# Patient Record
Sex: Female | Born: 1965 | Race: White | Hispanic: No | Smoking: Never smoker
Health system: Southern US, Community
[De-identification: ages and names within clinical notes are randomized; demographics above are authoritative.]

## PROBLEM LIST (undated history)

## (undated) DIAGNOSIS — F4541 Pain disorder exclusively related to psychological factors: Secondary | ICD-10-CM

## (undated) DIAGNOSIS — A6000 Herpesviral infection of urogenital system, unspecified: Secondary | ICD-10-CM

## (undated) DIAGNOSIS — K219 Gastro-esophageal reflux disease without esophagitis: Secondary | ICD-10-CM

## (undated) DIAGNOSIS — F329 Major depressive disorder, single episode, unspecified: Secondary | ICD-10-CM

## (undated) DIAGNOSIS — R51 Headache: Secondary | ICD-10-CM

## (undated) DIAGNOSIS — M1990 Unspecified osteoarthritis, unspecified site: Secondary | ICD-10-CM

## (undated) DIAGNOSIS — F988 Other specified behavioral and emotional disorders with onset usually occurring in childhood and adolescence: Secondary | ICD-10-CM

## (undated) DIAGNOSIS — N399 Disorder of urinary system, unspecified: Secondary | ICD-10-CM

## (undated) DIAGNOSIS — T7840XA Allergy, unspecified, initial encounter: Secondary | ICD-10-CM

## (undated) DIAGNOSIS — R350 Frequency of micturition: Secondary | ICD-10-CM

## (undated) DIAGNOSIS — I10 Essential (primary) hypertension: Secondary | ICD-10-CM

## (undated) DIAGNOSIS — R519 Headache, unspecified: Secondary | ICD-10-CM

## (undated) DIAGNOSIS — I639 Cerebral infarction, unspecified: Secondary | ICD-10-CM

## (undated) DIAGNOSIS — IMO0002 Reserved for concepts with insufficient information to code with codable children: Secondary | ICD-10-CM

## (undated) DIAGNOSIS — F32A Depression, unspecified: Secondary | ICD-10-CM

## (undated) DIAGNOSIS — F419 Anxiety disorder, unspecified: Secondary | ICD-10-CM

## (undated) HISTORY — DX: Pain disorder exclusively related to psychological factors: F45.41

## (undated) HISTORY — DX: Headache: R51

## (undated) HISTORY — DX: Major depressive disorder, single episode, unspecified: F32.9

## (undated) HISTORY — DX: Allergy, unspecified, initial encounter: T78.40XA

## (undated) HISTORY — PX: COLPOSCOPY: SHX161

## (undated) HISTORY — DX: Depression, unspecified: F32.A

## (undated) HISTORY — DX: Cerebral infarction, unspecified: I63.9

## (undated) HISTORY — DX: Other specified behavioral and emotional disorders with onset usually occurring in childhood and adolescence: F98.8

## (undated) HISTORY — DX: Reserved for concepts with insufficient information to code with codable children: IMO0002

## (undated) HISTORY — DX: Anxiety disorder, unspecified: F41.9

## (undated) HISTORY — DX: Frequency of micturition: R35.0

## (undated) HISTORY — DX: Headache, unspecified: R51.9

## (undated) HISTORY — DX: Gastro-esophageal reflux disease without esophagitis: K21.9

## (undated) HISTORY — DX: Disorder of urinary system, unspecified: N39.9

## (undated) HISTORY — DX: Herpesviral infection of urogenital system, unspecified: A60.00

## (undated) HISTORY — DX: Unspecified osteoarthritis, unspecified site: M19.90

---

## 2003-08-26 HISTORY — PX: BREAST ENHANCEMENT SURGERY: SHX7

## 2004-08-02 ENCOUNTER — Inpatient Hospital Stay: Payer: Self-pay | Admitting: Internal Medicine

## 2006-11-23 ENCOUNTER — Ambulatory Visit: Payer: Self-pay | Admitting: Internal Medicine

## 2007-03-28 HISTORY — PX: ENDOMETRIAL BIOPSY: SHX622

## 2008-06-02 ENCOUNTER — Emergency Department (HOSPITAL_COMMUNITY): Admission: EM | Admit: 2008-06-02 | Discharge: 2008-06-02 | Payer: Self-pay | Admitting: Emergency Medicine

## 2008-06-24 ENCOUNTER — Ambulatory Visit: Payer: Self-pay | Admitting: Internal Medicine

## 2011-08-19 ENCOUNTER — Ambulatory Visit: Payer: Self-pay | Admitting: Internal Medicine

## 2013-02-06 HISTORY — PX: BREAST BIOPSY: SHX20

## 2013-02-11 ENCOUNTER — Ambulatory Visit: Payer: Self-pay

## 2015-07-26 DIAGNOSIS — F900 Attention-deficit hyperactivity disorder, predominantly inattentive type: Secondary | ICD-10-CM | POA: Diagnosis not present

## 2015-07-26 DIAGNOSIS — F411 Generalized anxiety disorder: Secondary | ICD-10-CM | POA: Diagnosis not present

## 2015-07-26 DIAGNOSIS — N393 Stress incontinence (female) (male): Secondary | ICD-10-CM | POA: Diagnosis not present

## 2015-07-26 DIAGNOSIS — I10 Essential (primary) hypertension: Secondary | ICD-10-CM | POA: Diagnosis not present

## 2015-07-29 ENCOUNTER — Encounter: Payer: Self-pay | Admitting: Urology

## 2015-07-29 ENCOUNTER — Ambulatory Visit (INDEPENDENT_AMBULATORY_CARE_PROVIDER_SITE_OTHER): Payer: BLUE CROSS/BLUE SHIELD | Admitting: Urology

## 2015-07-29 VITALS — BP 145/83 | HR 72 | Ht 64.0 in | Wt 160.2 lb

## 2015-07-29 DIAGNOSIS — R350 Frequency of micturition: Secondary | ICD-10-CM | POA: Diagnosis not present

## 2015-07-29 DIAGNOSIS — R3129 Other microscopic hematuria: Secondary | ICD-10-CM

## 2015-07-29 DIAGNOSIS — N8110 Cystocele, unspecified: Secondary | ICD-10-CM | POA: Diagnosis not present

## 2015-07-29 DIAGNOSIS — IMO0001 Reserved for inherently not codable concepts without codable children: Secondary | ICD-10-CM

## 2015-07-29 DIAGNOSIS — R32 Unspecified urinary incontinence: Secondary | ICD-10-CM

## 2015-07-29 DIAGNOSIS — IMO0002 Reserved for concepts with insufficient information to code with codable children: Secondary | ICD-10-CM

## 2015-07-29 LAB — URINALYSIS, COMPLETE
BILIRUBIN UA: NEGATIVE
Glucose, UA: NEGATIVE
KETONES UA: NEGATIVE
Nitrite, UA: NEGATIVE
PH UA: 5.5 (ref 5.0–7.5)
PROTEIN UA: NEGATIVE
SPEC GRAV UA: 1.025 (ref 1.005–1.030)
Urobilinogen, Ur: 0.2 mg/dL (ref 0.2–1.0)

## 2015-07-29 LAB — BLADDER SCAN AMB NON-IMAGING: Scan Result: 0

## 2015-07-29 LAB — MICROSCOPIC EXAMINATION: Epithelial Cells (non renal): >10 /hpf — AB (ref 0–10)

## 2015-07-29 NOTE — Progress Notes (Signed)
07/29/2015 8:16 PM   Jillian Edwards 03/01/1966 [id number]  Referring provider: No referring provider defined for this encounter.  Chief Complaint  Patient presents with  . Urinary Frequency    referred by [name] at [facility]     HPI: Patient is a 50 year old Caucasian female who is referred by her PCP,  [name] FNP, urinary frequency and leakage.  Patient states that over the last year she has been experiencing urinary incontinence.  She states the leakage seeps through her clothes. She has tried an over-the-counter pedis area without relief. She is currently wearing panty liners in an effort to control the incontinence. She does not want to wear depends. She loses urine when she laughs, coughs and exercises. She does have some episodes of urge incontinence.  SHe has baseline urinary symptoms of urinary frequency, urgency, nocturia, intermittency and painful intercourse.  He does not experience dysuria, gross hematuria or suprapubic pain. She has not had recent fevers, chills, nausea or vomiting. She is not incontinent of stool. She was given a trial of Myrbetriq 25 mg daily, but it was unsuccessful in controlling her incontinence.  Her UA today demonstrated 3-10 RBCs per high prior field. She does not have a history of nephrolithiasis. Her PVR today is 0 mL.   PMH: Past Medical History  Diagnosis Date  . Frequent urination   . Anxiety   . Depression   . Stroke or transient ischemic attack (TIA) diagnosed during current admission     Surgical History: No past surgical history on file.  Home Medications:    Medication List       This list is accurate as of: 07/29/15 11:59 PM.  Always use your most recent med list.               ALPRAZolam 0.5 MG tablet  Commonly known as:  XANAX  TAKE 1/2-1 TABLET BY MOUTH TWICE DAILY AS NEEDED FOR ANXIETY     amphetamine-dextroamphetamine 10 MG tablet  Commonly known as:  ADDERALL  TAKE 1 TABLET BY MOUTH  TWICE A DAY AS NEEDED FOCUS **FILL 06/22/15**     buPROPion 150 MG 24 hr tablet  Commonly known as:  WELLBUTRIN XL     busPIRone 7.5 MG tablet  Commonly known as:  BUSPAR     chlorpheniramine-HYDROcodone 10-8 MG/5ML [place]  Commonly known as:  TUSSIONEX  Reported on 07/29/2015     hydrochlorothiazide 25 MG tablet  Commonly known as:  HYDRODIURIL     MYRBETRIQ 25 MG Tb24 tablet  Generic drug:  mirabegron ER  Take 50 mg by mouth daily.        Allergies: No Known Allergies  Family History: No family history on file.  Social History:  reports that she has never smoked. She does not have any smokeless tobacco history on file. She reports that she does not drink alcohol or use illicit drugs.  ROS: UROLOGY Frequent Urination?: Yes Hard to postpone urination?: Yes Burning/pain with urination?: No Get up at night to urinate?: Yes Leakage of urine?: Yes Urine stream starts and stops?: Yes Trouble starting stream?: No Do you have to strain to urinate?: No Blood in urine?: No Urinary tract infection?: No Sexually transmitted disease?: No Injury to kidneys or bladder?: No Painful intercourse?: Yes Weak stream?: No Currently pregnant?: No Vaginal bleeding?: No Last menstrual period?: n  Gastrointestinal Nausea?: No Vomiting?: No Indigestion/heartburn?: No Diarrhea?: No Constipation?: No  Constitutional Fever: No Night sweats?: No Weight loss?: No  Fatigue?: Yes  Skin Skin rash/lesions?: No Itching?: No  Eyes Blurred vision?: No Double vision?: No  Ears/Nose/Throat Sore throat?: No Sinus problems?: No  Hematologic/Lymphatic Swollen glands?: No Easy bruising?: Yes  Cardiovascular Leg swelling?: Yes Chest pain?: No  Respiratory Cough?: No Shortness of breath?: No  Endocrine Excessive thirst?: No  Musculoskeletal Back pain?: No Joint pain?: Yes  Neurological Headaches?: Yes Dizziness?: No  Psychologic Depression?: Yes Anxiety?:  Yes  Physical Exam: BP 145/83 mmHg  Pulse 72  Ht 5\' 4"  (1.626 m)  Wt 160 lb 3.2 oz (72.666 kg)  BMI 27.48 kg/m2  LMP 06/19/2015  Constitutional: Well nourished. Alert and oriented, No acute distress. HEENT: [place] AT, moist mucus membranes. Trachea midline, no masses. Cardiovascular: No clubbing, cyanosis, or edema. Respiratory: Normal respiratory effort, no increased work of breathing. GI: Abdomen is soft, non tender, non distended, no abdominal masses. Liver and spleen not palpable.  No hernias appreciated.  Stool sample for occult testing is not indicated.   GU: No CVA tenderness.  No bladder fullness or masses.  Normal external genitalia, normal pubic hair distribution, no lesions.  Normal urethral meatus, no lesions, no prolapse, no discharge.   No urethral masses, tenderness and/or tenderness. No bladder fullness, tenderness or masses. Normal vagina mucosa, good estrogen effect, no discharge, no lesions, good pelvic support, Grade II cystocele is noted.  Rectocele is not noted.  No cervical motion tenderness.  Uterus is freely mobile and non-fixed.  No adnexal/parametria masses or tenderness noted.  Anus and perineum are without rashes or lesions.    Skin: No rashes, bruises or suspicious lesions. Lymph: No cervical or inguinal adenopathy. Neurologic: Grossly intact, no focal deficits, moving all 4 extremities. Psychiatric: Normal mood and affect.  Laboratory Data:  Urinalysis Results for orders placed or performed in visit on 07/29/15  Microscopic Examination  Result Value Ref Range   WBC, UA 0-5 0 -  5 /hpf   RBC, UA 3-10 (A) 0 -  2 /hpf   Epithelial Cells (non renal) >10 (A) 0 - 10 /hpf   Bacteria, UA Few None seen/Few   Yeast, UA Present (A) None seen  Urinalysis, Complete  Result Value Ref Range   Specific Gravity, UA 1.025 1.005 - 1.030   pH, UA 5.5 5.0 - 7.5   Color, UA Yellow Yellow   Appearance Ur Hazy (A) Clear   Leukocytes, UA Trace (A) Negative   Protein, UA  Negative Negative/Trace   Glucose, UA Negative Negative   Ketones, UA Negative Negative   RBC, UA 1+ (A) Negative   Bilirubin, UA Negative Negative   Urobilinogen, Ur 0.2 0.2 - 1.0 mg/dL   Nitrite, UA Negative Negative   Microscopic Examination See below:   hCG, serum, qualitative  Result Value Ref Range   hCG,Beta Subunit,Qual,Serum Negative Negative <6 mIU/mL  BUN+Creat  Result Value Ref Range   BUN 16 6 - 24 mg/dL   Creatinine, Ser 0.82 0.57 - 1.00 mg/dL   GFR calc non Af Amer 84 >59 mL/min/1.73   GFR calc Af Amer 97 >59 mL/min/1.73   BUN/Creatinine Ratio 20 9 - 23  BLADDER SCAN AMB NON-IMAGING  Result Value Ref Range   Scan Result 0     Pertinent Imaging: Results for RYLIEGH, SEVCIK (MRN [id number]) as of 07/29/2015 13:54  Ref. Range 07/29/2015 13:45  Scan Result Unknown 0    Assessment & Plan:    1. Incontinence:   Patient was found to have a cystocele on exam.  Discussed the mechanism of action with stress incontinence and offered her an appointment with gynecology for pessary fitting, physical therapy or an appointment with Dr. Matilde Sprang.  She would like to have a trial of physical therapy at this time.   2. Urinary frequency:   Patient has been experiencing urinary frequency that did not respond to Myrbetriq 25 mg daily. Her UA did demonstrate 3-10 RBCs per prior field. She will be undergoing a CT urogram and cystoscopy in the future.  - Urinalysis, Complete - BLADDER SCAN AMB NON-IMAGING  3. Microscopic hematuria:   Explained to patient the causes of blood in the urine are as follows: stones, UTI's, damage to the urinary tract and/or cancer.  It is explained to the patient that they will be scheduled for a CT Urogram with contrast material and that in rare instances, an allergic reaction can be serious and even life threatening with the injection of contrast material.   The patient denies any allergies to contrast, iodine and/or seafood and is not taking  metformin.  I have explained to the patient that they will  be scheduled for a cystoscopy in our office to evaluate their bladder.  The cystoscopy consists of passing a tube with a lens up through their urethra and into their urinary bladder.   We will inject the urethra with a lidocaine gel prior to introducing the cystoscope to help with any discomfort during the procedure.   After the procedure, they might experience blood in the urine and discomfort with urination.  This will abate after the first few voids.  I have  encouraged the patient to increase water intake  during this time.  Patient denies any allergies to lidocaine.   4. Cystocele:   Refer to PT.    Return for CT Urogram report and cystoscopy.  These notes generated with voice recognition software. I apologize for typographical errors.  Zara Council, [facility] [address] [place],  [address] [phone]

## 2015-07-30 LAB — BUN+CREAT
BUN / CREAT RATIO: 20 (ref 9–23)
BUN: 16 mg/dL (ref 6–24)
CREATININE: 0.82 mg/dL (ref 0.57–1.00)
GFR, EST AFRICAN AMERICAN: 97 mL/min/{1.73_m2} (ref >59)
GFR, EST NON AFRICAN AMERICAN: 84 mL/min/{1.73_m2} (ref >59)

## 2015-07-30 LAB — HCG, SERUM, QUALITATIVE: hCG,Beta Subunit,Qual,Serum: NEGATIVE m[IU]/mL (ref <6)

## 2015-08-01 ENCOUNTER — Telehealth: Payer: Self-pay | Admitting: Urology

## 2015-08-01 DIAGNOSIS — R350 Frequency of micturition: Secondary | ICD-10-CM | POA: Insufficient documentation

## 2015-08-01 DIAGNOSIS — R3129 Other microscopic hematuria: Secondary | ICD-10-CM | POA: Insufficient documentation

## 2015-08-01 DIAGNOSIS — IMO0002 Reserved for concepts with insufficient information to code with codable children: Secondary | ICD-10-CM

## 2015-08-01 DIAGNOSIS — R32 Unspecified urinary incontinence: Secondary | ICD-10-CM | POA: Insufficient documentation

## 2015-08-01 DIAGNOSIS — IMO0001 Reserved for inherently not codable concepts without codable children: Secondary | ICD-10-CM | POA: Insufficient documentation

## 2015-08-01 NOTE — Telephone Encounter (Signed)
A CT Urogram has been ordered for the patient.  She will need a cystoscopy appointment.

## 2015-08-02 NOTE — Telephone Encounter (Signed)
Spoke with pt in reference to CT, cysto, and continuing medication. Pt voiced understanding.

## 2015-08-02 NOTE — Telephone Encounter (Signed)
No. Continue the Myrbetriq.  She needs to have the CT Urogram and cystoscopy.

## 2015-08-02 NOTE — Telephone Encounter (Signed)
Patient said she has not started her period yet and was told to call you today if she has not started. Does she need to stop her medication?  Please call her at [phone] [facility]

## 2015-08-09 ENCOUNTER — Ambulatory Visit
Admission: RE | Admit: 2015-08-09 | Discharge: 2015-08-09 | Disposition: A | Discharge status: Home / Self Care | Payer: BLUE CROSS/BLUE SHIELD | Source: Ambulatory Visit | Attending: Urology | Admitting: Urology

## 2015-08-09 DIAGNOSIS — R3129 Other microscopic hematuria: Secondary | ICD-10-CM | POA: Insufficient documentation

## 2015-08-09 HISTORY — DX: Essential (primary) hypertension: I10

## 2015-08-09 MED ORDER — IOPAMIDOL (ISOVUE-370) INJECTION 76%
125.0000 mL | Freq: Once | INTRAVENOUS | Status: AC | PRN
  Administered 2015-08-09: 125 mL via INTRAVENOUS

## 2015-08-16 ENCOUNTER — Telehealth: Payer: Self-pay

## 2015-08-16 NOTE — Telephone Encounter (Signed)
-----   Message from Nori Riis, PA-C sent at 08/15/2015  6:06 PM EDT ----- Radiology report indicates no findings to explain the blood in the urine.  I would encourage the patient to keep her cystoscopy appointment, so that she can go over the report in more detail and have her bladder examined.

## 2015-08-16 NOTE — Telephone Encounter (Signed)
Spoke with pt in reference to radiology results. Encouraged to keep cysto appt. Pt voiced understanding.

## 2015-08-24 ENCOUNTER — Telehealth: Payer: Self-pay | Admitting: Urology

## 2015-08-24 NOTE — Telephone Encounter (Signed)
Patient called the office and cancelled her cysto appointment.  She does not want to have it at this time.

## 2015-08-25 DIAGNOSIS — N926 Irregular menstruation, unspecified: Secondary | ICD-10-CM | POA: Diagnosis not present

## 2015-08-25 DIAGNOSIS — Z124 Encounter for screening for malignant neoplasm of cervix: Secondary | ICD-10-CM | POA: Diagnosis not present

## 2015-08-25 DIAGNOSIS — Z01419 Encounter for gynecological examination (general) (routine) without abnormal findings: Secondary | ICD-10-CM | POA: Diagnosis not present

## 2015-08-25 DIAGNOSIS — R232 Flushing: Secondary | ICD-10-CM | POA: Diagnosis not present

## 2015-08-26 ENCOUNTER — Other Ambulatory Visit: Payer: Managed Care, Other (non HMO)

## 2015-08-30 ENCOUNTER — Ambulatory Visit: Payer: BLUE CROSS/BLUE SHIELD | Attending: Urology | Admitting: Physical Therapy

## 2015-08-30 ENCOUNTER — Encounter: Payer: Self-pay | Admitting: Physical Therapy

## 2015-08-30 DIAGNOSIS — N393 Stress incontinence (female) (male): Secondary | ICD-10-CM | POA: Diagnosis not present

## 2015-08-30 DIAGNOSIS — R279 Unspecified lack of coordination: Secondary | ICD-10-CM

## 2015-08-30 DIAGNOSIS — M791 Myalgia, unspecified site: Secondary | ICD-10-CM

## 2015-08-30 DIAGNOSIS — M5442 Lumbago with sciatica, left side: Secondary | ICD-10-CM | POA: Diagnosis not present

## 2015-08-30 NOTE — Patient Instructions (Addendum)
1. Wear a bra extenser and looser bra straps in order to allow for diaphragmatic expansion        2. You are now ready to begin training the deep core muscles system: diaphragm, transverse abdominis, pelvic floor . These muscles must work together as a team.     The key to these exercises to train the brain to coordinate the timing of these muscles and to have them turn on for long periods of time to hold you upright against gravity (especially important if you are on your feet all day).These muscles are postural muscles and play a role stabilizing your spine and bodyweight. By doing these repetitions slowly and correctly instead of doing crunches, you will achieve a flatter belly without a lower pooch. You are also placing your spine in a more neutral position and breathing properly which in turn, decreases your risk for problems related to your pelvic floor, abdominal, and low back such as pelvic organ prolapse, hernias, diastasis recti (separation of superficial muscles), disk herniations, spinal fractures. These exercises set a solid foundation for you to later progress to resistance/ strength training with therabands and weights and return to other typical fitness exercises with a stronger deeper core.   Do level 1                                                 3. Preserve the function of your pelvic floor, abdomen, and back. Avoid decreased straining of abdominal/pelvic floor muscles with less slouching,  holding your breath with lifting/bowel movements)                                                     FUNCTIONAL POSTURES

## 2015-08-30 NOTE — Therapy (Signed)
[place] MAIN [facility] SERVICES [address] [place], [place], [address] Phone: [phone]   Fax:  [phone]  Physical Therapy Evaluation  Patient Details  Name: Jillian Edwards MRN: [id number] Date of Birth: 27-Nov-1965 Referring Provider: Zara Council, MD  Encounter Date: 08/30/2015      PT End of Session - 08/30/15 0929    Visit Number 1   Number of Visits 12   Date for PT Re-Evaluation 11/22/15   PT Start Time 0805   PT Stop Time 0907   PT Time Calculation (min) 62 min   Activity Tolerance Patient tolerated treatment well;No increased pain   Behavior During Therapy [facility] for tasks assessed/performed;Restless      Past Medical History  Diagnosis Date  . Frequent urination   . Anxiety   . Depression   . Stroke or transient ischemic attack (TIA) diagnosed during current admission   . Hypertension   . Stroke ([place]) ~2005     loss of peripheral vision  . Stress headaches     History reviewed. No pertinent past surgical history.  There were no vitals filed for this visit.       Subjective Assessment - 08/30/15 0843    Subjective 1) SUI with sneezing, laughing, and gym activities (jumping, toe taps, leg press). Denied urinary frequency. Tendency to delay urination because she is busy and puts it off. Pt reports pre-menopasual Sx with hot flashes and increased sweating that makes exercising at the gym uncomfortable in combination with SUI. Changing urinary pads with exercise only 1x/ per workout. 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 Urinary frequency occurred 1x/ 2 hrs. Denied urge nor frequency Sx.   2) poor sleep and experiences stress and worry  3) straining with bowel movements, Bristol Stool Type 1, 1x daily     4)  L radiating pain that occurs only when sleeping on her L side. 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 , Hx of 2 vaginal births with perineal tearing   Patient Stated Goals tighter core and less stress            [facility] PT Assessment - 08/30/15 0923    Assessment   Medical Diagnosis Urinary frequency   Referring Provider [place]   Precautions   Precautions None   Restrictions   Weight Bearing Restrictions No   Balance Screen   Has the patient fallen in the past 6 months No   Prior Function   Level of Independence Independent   Coordination   Gross Motor Movements are Fluid and Coordinated --  chest breathing, limited diaphragmatic excursion   Fine Motor Movements are Fluid and Coordinated --  abdominal straining w/ cue for bowel movement    Coordination and Movement Description posterior tilt, abdominal mm accessory overuse with cue with pelvic floor contraction   Other:   Other/ Comments breathholding with bicep curl using 10 lb weight   Other:   Other/Comments lumbar lordosis, abdominal straining w/ double leg lift, pelvic rocking with single leg lift    Palpation   Spinal mobility upper trap and mid back mm tensions    Palpation comment pelvic alignment to assess at next session   Bed Mobility   Bed Mobility --  crunch method                  Pelvic Floor Special Questions - 08/30/15 0928    Diastasis Recti neg   External Palpation no mm tenderness nor tensions with palpation    Pelvic Floor Internal Exam deferred to next session                  PT Education - 08/30/15 0929    Education provided Yes   Education Details POC, anatomy, phsyiology, goals, motivational interviewing    Person(s) Educated Patient   Methods Explanation;Demonstration;Tactile cues;Verbal cues;Handout   Comprehension Verbal cues required;Returned demonstration;Verbalized understanding;Tactile cues required             PT Long Term Goals -  08/30/15 0849    PT LONG TERM GOAL #1   Title Pt will report decreased use of poise tampon and relay on only 1 pad when exercising in order to demo improved incontinence.     Time 12   Period Weeks   Status New   PT LONG TERM GOAL #2   Title Pt will demo ability to jump on a trampoline for 30 sec intervals without report of leakage x 3 reps in order to return to jumping at the gym.    Time 12   Period Weeks   Status New   PT LONG TERM GOAL #3   Title Pt will demo no lumbopelvic instability with deep core elvel 2-3 for 5 reps in order to progress to upright and outercore strengthening exercises with weights.    Time 12   Period Weeks   Status New   PT LONG TERM GOAL #4   Title Pt will decrease her score on PSQI from 62% to < 52% in order to improve quality of sleep   Time 12   Period Weeks   Status New   PT LONG TERM GOAL #5   Title Pt will decrease her score on PFIQ-7  from 85% to < 50%  in order to improve her QOL   Time 12   Period Weeks   Status New   Additional Long Term Goals   Additional Long Term Goals Yes   PT LONG TERM GOAL #6   Title Pt will report a decreased score on ZUNG Anxiety Scale from 61% to < 31% in order to improved stress-management skills and improve pelvic floor function.   Time 12   Period Weeks   Status New   PT LONG TERM GOAL #7   Title Pt will report improved stool consistency from Type 1 to Type 3-4 based on Bristol Stool Scale in order to minimize straining of abdomen and pelvic pelvic floor and perserve pelvic floor health.    Time 12   Period Weeks   Status New               Plan - 08/30/15 0930    Clinical Impression Statement Pt is a 50 yo female who c/o stress urinary incontinence, poor sleep and stress management skills, poor stool consistency, and L radiating LBP. Based on her PT exam, pt showed dyscoordination of diaphragm, abdomen, and pelvic floor mm (deep core system) with functional activities such as breathing, lifting  weights, leg lift exercise, and bed mobility. Additonally, pt showed limited diaphragmatic excursion, upper cross syndrome (upper trap tensions, rounded shoulders), and poor understanding of proper body mechanics. Pt was tearful during session and expressed emotional distress related to her urinary Sx, menopausal changes,  and poor sleep. Pt was educated on proper body mechanics/ deep core coordination for sitting at her desk, toilet, and lifting weights. Pt was also shown how to use breathing techniques for stress management and as a wind-down routine prior to bed. Pt was alsoe ducated to sleep with a pillow between her legs to minimize L radiating LBP. Pt showed techniques properly. Plan to assess pelvic floor internally at next session.   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, Hx of 2 vaginal deliveries with perineal tearing   PT Frequency 1x / week   PT Duration 12 weeks   PT Treatment/Interventions ADLs/Self Care Home Management;Aquatic Therapy;Biofeedback;Cryotherapy;Moist Heat;Traction;Patient/family education;Neuromuscular re-education;Balance training;Therapeutic exercise;Therapeutic activities;Functional mobility training;Stair training;Gait training;Manual techniques;Manual lymph drainage;Scar mobilization;Passive range of motion;Energy conservation;Taping   Consulted and Agree with Plan of Care Patient       Patient will benefit from skilled therapeutic intervention in order to improve the following deficits and impairments:  Increased muscle spasms, Improper body mechanics, Postural dysfunction, Decreased strength, Impaired flexibility, Decreased activity tolerance, Decreased mobility, Decreased range of motion, Decreased safety awareness, Decreased endurance, Decreased coordination, Hypomobility  Visit Diagnosis: Stress incontinence (female) (female)  Unspecified lack of coordination  Myalgia  Left-sided low back pain with left-sided sciatica     Problem List Patient Active Problem List   Diagnosis Date Noted  . Incontinence 08/01/2015  . Microscopic hematuria 08/01/2015  . Urinary frequency 08/01/2015  . Cystocele, grade 2 08/01/2015    Jerl Mina ,PT, DPT, E-RYT  08/30/2015, 10:00 AM  [place] MAIN [facility] SERVICES [address] [place], [place], [address] Phone: [phone]   Fax:  [phone]  Name: Jillian Edwards MRN: [id number] Date of Birth: [birth year]

## 2015-09-06 ENCOUNTER — Ambulatory Visit: Payer: 59 | Admitting: Physical Therapy

## 2015-09-06 ENCOUNTER — Encounter: Payer: BLUE CROSS/BLUE SHIELD | Admitting: Physical Therapy

## 2015-09-20 ENCOUNTER — Ambulatory Visit: Payer: BLUE CROSS/BLUE SHIELD | Admitting: Physical Therapy

## 2015-09-20 DIAGNOSIS — N393 Stress incontinence (female) (male): Secondary | ICD-10-CM | POA: Diagnosis not present

## 2015-09-20 DIAGNOSIS — M5442 Lumbago with sciatica, left side: Secondary | ICD-10-CM

## 2015-09-20 DIAGNOSIS — R279 Unspecified lack of coordination: Secondary | ICD-10-CM | POA: Diagnosis not present

## 2015-09-20 DIAGNOSIS — M791 Myalgia, unspecified site: Secondary | ICD-10-CM

## 2015-09-20 NOTE — Patient Instructions (Signed)
Deep core 1 with pelvic floor coordination  10 reps x 3 / 2 x day  ______ Hip stretches: in between the deep core level 1   Windshield wipers (rocking knees side to side)   Figure -4 (ankle crossed over opposite thigh, use strap under thigh to bring legs closer without rounded shoulders)   Happy baby ( knees up and out towards armpits, strap under [place] of feet, widen feet   5 reps with breathing _____  At work:  Figure -4 stretch (piriformis stretch)   Upper Trap muscle stretches:  Tilt head, look into armpit,      Or towel pulled under arm      Pectoralis muscle stretch by the wall

## 2015-09-21 NOTE — Therapy (Signed)
[place] MAIN [facility] SERVICES [address] [place], [place], [address] Phone: [phone]   Fax:  [phone]  Physical Therapy Treatment  Patient Details  Name: Jillian Edwards MRN: [id number] Date of Birth: 06/10/1965 Referring Provider: Ernestine Conrad  Encounter Date: 09/20/2015      PT End of Session - 09/20/15 0952    Visit Number 2   Number of Visits 12   Date for PT Re-Evaluation 11/22/15   PT Start Time 0905   PT Stop Time 0958   PT Time Calculation (min) 53 min   Activity Tolerance Patient tolerated treatment well;No increased pain   Behavior During Therapy [facility] for tasks assessed/performed;Restless      Past Medical History  Diagnosis Date  . Frequent urination   . Anxiety   . Depression   . Stroke or transient ischemic attack (TIA) diagnosed during current admission   . Hypertension   . Stroke ([place]) ~2005     loss of peripheral vision  . Stress headaches     No past surgical history on file.  There were no vitals filed for this visit.      Subjective Assessment - 09/20/15 0905    Subjective Pt has been practicing the proper way to get out bed with log rolling. Work is stressful. Pt has been practicing the breathing techniques. Pt was able to sleep well yesterday.    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 , Hx of 2 vaginal births with perineal tearing   Patient Stated Goals tigher core and less stress            [facility] PT Assessment - 09/21/15 0826    Observation/Other Assessments   Observations proper sitting posture   Coordination   Gross Motor Movements are Fluid and Coordinated --  coordination improved for diaphragmatic breathing                  Pelvic Floor Special Questions - 09/21/15 0823    Pelvic Floor Internal Exam pt consented verbally without ocntraindications    Exam Type Vaginal    Palpation no mm tensions/tenderness   Strength weak squeeze, no lift  bladder in more dorsal position    Biofeedback pt required cues for pelvic neutral, manual Tx on suprapubic area and verbal/tactile cues for circumferential contraction of pelvic floor. Pt achieved 3/5 post Tx and did not require elevation of hips with a pillow. Withheld contraction/ endurance training due to pt's need for downtraining her nervous system and increasing flexibility in her hips           [facility] Adult PT Treatment/Exercise - 09/21/15 [id number]    Neuro Re-ed    Neuro Re-ed Details  pelvic floor ROM coordination with  breathing.   Exercises   Exercises --  see pt instructions                PT Education - 09/20/15 [phone]    Education provided Yes   Education Details HEP   Person(s) Educated Patient   Methods Explanation;Tactile cues;Demonstration;Verbal cues;Handout   Comprehension Returned demonstration;Verbalized understanding             PT Long Term Goals - 08/30/15 0849    PT LONG TERM GOAL #1   Title Pt will report decreased use of poise tampon and relay on only 1 pad when exercising in order  to demo improved incontinence.     Time 12   Period Weeks   Status New   PT LONG TERM GOAL #2   Title Pt will demo ability to jump on a trampoline for 30 sec intervals without report of leakage x 3 reps in order to return to jumping at the gym.    Time 12   Period Weeks   Status New   PT LONG TERM GOAL #3   Title Pt will demo no lumbopelvic instability with deep core elvel 2-3 for 5 reps in order to progress to upright and outercore strengthening exercises with weights.    Time 12   Period Weeks   Status New   PT LONG TERM GOAL #4   Title Pt will decrease her score on PSQI from 62% to < 52% in order to improve quality of sleep   Time 12   Period Weeks   Status New   PT LONG TERM GOAL #5   Title Pt will decrease her score on PFIQ-7  from 85% to < 50%  in order to improve her QOL    Time 12   Period Weeks   Status New   Additional Long Term Goals   Additional Long Term Goals Yes   PT LONG TERM GOAL #6   Title Pt will report a decreased score on ZUNG Anxiety Scale from 61% to < 31% in order to improved stress-management skills and improve pelvic floor function.   Time 12   Period Weeks   Status New   PT LONG TERM GOAL #7   Title Pt will report improved stool consistency from Type 1 to Type 3-4 based on Bristol Stool Scale in order to minimize straining of abdomen and pelvic pelvic floor and perserve pelvic floor health.    Time 12   Period Weeks   Status New               Plan - 09/20/15 [id number]    Clinical Impression Statement Pt showed good carry over with recommendations from first session. Pt achieved pelvic floor coordination and circumferential closure of pelvic floor mm following manual Tx over suprapubic area and neuromuscular re-education. Pt 's bladder position was originally in a more  dorsal position in hooklying position and after Tx, it was in a more cranial position.  Pt required cuing to relax her lower abdominal muscle and to avoid valsalva maneuver with exercising. Pt voiced understanding and demonstrated proper technique.  Hip and upper trap stretches were added to her HEP to decrease anxiety and to increase hip mobility. Pt will continue to benefit from skilled PT.     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, Hx of 2 vaginal deliveries with perineal tearing   PT Frequency 1x / week   PT Duration 12 weeks   PT Treatment/Interventions ADLs/Self Care Home Management;Aquatic Therapy;Biofeedback;Cryotherapy;Moist Heat;Traction;Patient/family education;Neuromuscular re-education;Balance training;Therapeutic exercise;Therapeutic activities;Functional mobility  training;Stair training;Gait training;Manual techniques;Manual lymph drainage;Scar mobilization;Passive range of motion;Energy conservation;Taping   Consulted and Agree with Plan of Care Patient      Patient will benefit from skilled therapeutic intervention in order to improve the following deficits and impairments:  Increased muscle spasms, Improper body mechanics, Postural dysfunction, Decreased strength, Impaired flexibility, Decreased activity tolerance, Decreased mobility, Decreased range of motion, Decreased safety awareness, Decreased endurance, Decreased coordination, Hypomobility  Visit Diagnosis: Stress incontinence (female) (female)  Unspecified lack of coordination  Myalgia  Left-sided low back pain with left-sided sciatica     Problem List Patient Active Problem List   Diagnosis Date Noted  . Incontinence 08/01/2015  . Microscopic hematuria 08/01/2015  . Urinary frequency 08/01/2015  . Cystocele, grade 2 08/01/2015    Jerl Mina ,PT, DPT, E-RYT  09/21/2015, 8:34 AM  [place] MAIN [facility] SERVICES [address] [place], [place], [address] Phone: [phone]   Fax:  [phone]  Name: DENIM MAILLARD MRN: [id number] Date of Birth: 10-12-1965

## 2015-09-23 DIAGNOSIS — J0111 Acute recurrent frontal sinusitis: Secondary | ICD-10-CM | POA: Diagnosis not present

## 2015-09-23 DIAGNOSIS — J029 Acute pharyngitis, unspecified: Secondary | ICD-10-CM | POA: Diagnosis not present

## 2015-09-29 ENCOUNTER — Ambulatory Visit: Payer: BLUE CROSS/BLUE SHIELD | Attending: Urology | Admitting: Physical Therapy

## 2015-09-29 DIAGNOSIS — M5442 Lumbago with sciatica, left side: Secondary | ICD-10-CM | POA: Insufficient documentation

## 2015-09-29 DIAGNOSIS — N393 Stress incontinence (female) (male): Secondary | ICD-10-CM | POA: Insufficient documentation

## 2015-09-29 DIAGNOSIS — M791 Myalgia: Secondary | ICD-10-CM | POA: Insufficient documentation

## 2015-09-29 DIAGNOSIS — R279 Unspecified lack of coordination: Secondary | ICD-10-CM | POA: Insufficient documentation

## 2015-10-04 ENCOUNTER — Ambulatory Visit: Payer: BLUE CROSS/BLUE SHIELD | Admitting: Physical Therapy

## 2015-10-04 DIAGNOSIS — M5442 Lumbago with sciatica, left side: Secondary | ICD-10-CM | POA: Diagnosis not present

## 2015-10-04 DIAGNOSIS — M791 Myalgia, unspecified site: Secondary | ICD-10-CM

## 2015-10-04 DIAGNOSIS — N393 Stress incontinence (female) (male): Secondary | ICD-10-CM | POA: Diagnosis not present

## 2015-10-04 DIAGNOSIS — R279 Unspecified lack of coordination: Secondary | ICD-10-CM | POA: Diagnosis not present

## 2015-10-04 NOTE — Patient Instructions (Addendum)
Add to stretches:  For midback 1. Open book (handout)   2. Relaxing onto yoga blocks (2" thick)  With knees bent,  One block vertical to spine at midback and the other block placed horizontal under head  Palms open up to sky   Strengthening midback and scapular strengthening  "w" with yellow band  (incorporate pelvic floor gentle contraction with exhalation)  10 x 3 sets     _____________________  Work stretches:  Open gate by the wall (10 rep each side)   Eagle pose  (3 breaths)

## 2015-10-05 NOTE — Therapy (Signed)
[place] MAIN [facility] SERVICES [address] [place], [place], [address] Phone: [phone]   Fax:  [phone]  Physical Therapy Treatment  Patient Details  Name: Jillian Edwards MRN: [id number] Date of Birth: 1965-07-20 Referring Provider: Ernestine Conrad  Encounter Date: 10/04/2015      PT End of Session - 10/04/15 0957    Visit Number 3   Number of Visits 12   Date for PT Re-Evaluation 11/22/15   PT Start Time 0905   PT Stop Time 1000   PT Time Calculation (min) 55 min   Activity Tolerance Patient tolerated treatment well;No increased pain   Behavior During Therapy [facility] for tasks assessed/performed;Restless      Past Medical History  Diagnosis Date  . Frequent urination   . Anxiety   . Depression   . Stroke or transient ischemic attack (TIA) diagnosed during current admission   . Hypertension   . Stroke ([place]) ~2005     loss of peripheral vision  . Stress headaches     No past surgical history on file.  There were no vitals filed for this visit.      Subjective Assessment - 10/04/15 0909    Subjective Pt got sick last week and has not been able to practice her HEP.    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 , Hx of 2 vaginal births with perineal tearing   Patient Stated Goals tigher core and less stress            [facility] PT Assessment - 10/05/15 0838    Coordination   Fine Motor Movements are Fluid and Coordinated --  pelvic floor coordination with good carry over   Palpation   Palpation comment increased midback mm tensions B and distal attachment to levator and upper traps decreased post Tx                     [facility] Adult PT Treatment/Exercise - 10/05/15 [id number]    Neuro Re-ed    Neuro Re-ed Details  cues to decrease upper trap overuse, exercise education with weighttraining, stretches to counter mm  tensions    Manual Therapy   Manual therapy comments STM along levator, upper traps, and MWM open book along paraspinal mm                  PT Education - 10/05/15 0839    Education provided Yes   Education Details HEP   Person(s) Educated Patient   Methods Explanation;Demonstration;Tactile cues;Verbal cues;Handout   Comprehension Returned demonstration;Verbalized understanding             PT Long Term Goals - 08/30/15 0849    PT LONG TERM GOAL #1   Title Pt will report decreased use of poise tampon and relay on only 1 pad when exercising in order to demo improved incontinence.     Time 12   Period Weeks   Status New   PT LONG TERM GOAL #2   Title Pt will demo ability to jump on a trampoline for 30 sec intervals without report of leakage x 3 reps in order to return to jumping at the gym.    Time 12   Period Weeks   Status New   PT LONG TERM GOAL #3   Title Pt will demo no lumbopelvic instability with deep core elvel  2-3 for 5 reps in order to progress to upright and outercore strengthening exercises with weights.    Time 12   Period Weeks   Status New   PT LONG TERM GOAL #4   Title Pt will decrease her score on PSQI from 62% to < 52% in order to improve quality of sleep   Time 12   Period Weeks   Status New   PT LONG TERM GOAL #5   Title Pt will decrease her score on PFIQ-7  from 85% to < 50%  in order to improve her QOL   Time 12   Period Weeks   Status New   Additional Long Term Goals   Additional Long Term Goals Yes   PT LONG TERM GOAL #6   Title Pt will report a decreased score on ZUNG Anxiety Scale from 61% to < 31% in order to improved stress-management skills and improve pelvic floor function.   Time 12   Period Weeks   Status New   PT LONG TERM GOAL #7   Title Pt will report improved stool consistency from Type 1 to Type 3-4 based on Bristol Stool Scale in order to minimize straining of abdomen and pelvic pelvic floor and perserve pelvic floor  health.    Time 12   Period Weeks   Status New               Plan - 10/05/15 [id number]    Clinical Impression Statement Pt showed good activation of pelvic floor mm from external assessment. Withheld internal assessment because pt is just getting over illness. Pt was educated on the use of stretching routine and strengthening exercises to manage her complaint of neck mm tensions and the long term benefit of PT recommendations compared to short-term effects with the use of a TENS unit.  Pt voiced understanding and motivation to learn exercises. Pt's pelvic floor Sx are likely connected to her upper trap overuse which is apparent in her posture and the weight lifting program that her fitness trainer has prescribed for her. Plan to modify her fitness routine at next session. Pt wil continue to benefit from skilled PT.     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, Hx of 2 vaginal deliveries with perineal tearing   PT Frequency 1x / week   PT Duration 12 weeks   PT Treatment/Interventions ADLs/Self Care Home Management;Aquatic Therapy;Biofeedback;Cryotherapy;Moist Heat;Traction;Patient/family education;Neuromuscular re-education;Balance training;Therapeutic exercise;Therapeutic activities;Functional mobility training;Stair training;Gait training;Manual techniques;Manual lymph drainage;Scar mobilization;Passive range of motion;Energy conservation;Taping   Consulted and Agree with Plan of Care Patient      Patient will benefit from skilled therapeutic intervention in order to improve the following deficits and impairments:  Increased muscle spasms, Improper body mechanics, Postural dysfunction, Decreased strength, Impaired flexibility, Decreased activity tolerance, Decreased mobility, Decreased range of motion,  Decreased safety awareness, Decreased endurance, Decreased coordination, Hypomobility  Visit Diagnosis: Stress incontinence (female) (female)  Unspecified lack of coordination  Myalgia  Left-sided low back pain with left-sided sciatica     Problem List Patient Active Problem List   Diagnosis Date Noted  . Incontinence 08/01/2015  . Microscopic hematuria 08/01/2015  . Urinary frequency 08/01/2015  . Cystocele, grade 2 08/01/2015    Jerl Mina ,PT, DPT, E-RYT  10/05/2015, 8:48 AM  [place] MAIN [facility] SERVICES [address]  [place], [place], [address] Phone: [phone]   Fax:  [phone]  Name: Jillian Edwards MRN: [id number] Date of Birth: 1965-04-23

## 2015-10-14 ENCOUNTER — Ambulatory Visit: Payer: BLUE CROSS/BLUE SHIELD | Admitting: Physical Therapy

## 2015-10-18 ENCOUNTER — Ambulatory Visit: Payer: BLUE CROSS/BLUE SHIELD | Admitting: Physical Therapy

## 2015-10-25 DIAGNOSIS — F900 Attention-deficit hyperactivity disorder, predominantly inattentive type: Secondary | ICD-10-CM | POA: Diagnosis not present

## 2015-10-25 DIAGNOSIS — J309 Allergic rhinitis, unspecified: Secondary | ICD-10-CM | POA: Diagnosis not present

## 2015-10-25 DIAGNOSIS — N393 Stress incontinence (female) (male): Secondary | ICD-10-CM | POA: Diagnosis not present

## 2015-10-25 DIAGNOSIS — N959 Unspecified menopausal and perimenopausal disorder: Secondary | ICD-10-CM | POA: Diagnosis not present

## 2015-10-26 ENCOUNTER — Encounter: Payer: Self-pay | Admitting: Physical Therapy

## 2015-10-26 ENCOUNTER — Ambulatory Visit: Payer: BLUE CROSS/BLUE SHIELD | Admitting: Physical Therapy

## 2015-10-26 DIAGNOSIS — R279 Unspecified lack of coordination: Secondary | ICD-10-CM

## 2015-10-26 DIAGNOSIS — M5442 Lumbago with sciatica, left side: Secondary | ICD-10-CM

## 2015-10-26 DIAGNOSIS — M791 Myalgia, unspecified site: Secondary | ICD-10-CM

## 2015-10-26 DIAGNOSIS — N393 Stress incontinence (female) (male): Secondary | ICD-10-CM

## 2015-10-26 NOTE — Therapy (Signed)
[place] MAIN [facility] SERVICES [address] [place], [place], [address] Phone: [phone]   Fax:  [phone]  Patient Details  Name: Jillian Edwards MRN: [id number] Date of Birth: 04-24-1965 Referring Provider:  Dr. Ernestine Conrad  Encounter Date: 10/26/2015                                                                                Discharge Summary   Pt completed 3 visits of pelvic health PT and demo'd more cranial position of her pelvic organs following manual Tx and neuro-muscular reeducation. Pt was educated on proper deep core coordination and ways to avoid downward strain on the pelvic floor. Pt was also treated with manual techniques to decrease her neck and shoulder tensions and postural education. She was prescribed stretches and informed that future PT visits will include education on how to modify her fitness routine in order to optimize pelvic health and decrease neck and shoulder tensions.   Pt has been non-compliant since her 3rd visit with repeated no shows to her appts. Pt is getting d/c at this time.   Thank you for your referral.    Jerl Mina ,PT, DPT, E-RYT  10/26/2015, 4:19 PM  [place] MAIN [facility] SERVICES [address] [place], [place], [address] Phone: [phone]   Fax:  [phone]

## 2015-11-01 ENCOUNTER — Encounter: Payer: BLUE CROSS/BLUE SHIELD | Admitting: Physical Therapy

## 2015-11-01 DIAGNOSIS — M9902 Segmental and somatic dysfunction of thoracic region: Secondary | ICD-10-CM | POA: Diagnosis not present

## 2015-11-01 DIAGNOSIS — M5414 Radiculopathy, thoracic region: Secondary | ICD-10-CM | POA: Diagnosis not present

## 2015-11-01 DIAGNOSIS — M9903 Segmental and somatic dysfunction of lumbar region: Secondary | ICD-10-CM | POA: Diagnosis not present

## 2015-11-01 DIAGNOSIS — M5432 Sciatica, left side: Secondary | ICD-10-CM | POA: Diagnosis not present

## 2015-11-02 DIAGNOSIS — M5414 Radiculopathy, thoracic region: Secondary | ICD-10-CM | POA: Diagnosis not present

## 2015-11-02 DIAGNOSIS — M5432 Sciatica, left side: Secondary | ICD-10-CM | POA: Diagnosis not present

## 2015-11-02 DIAGNOSIS — M9903 Segmental and somatic dysfunction of lumbar region: Secondary | ICD-10-CM | POA: Diagnosis not present

## 2015-11-02 DIAGNOSIS — M9902 Segmental and somatic dysfunction of thoracic region: Secondary | ICD-10-CM | POA: Diagnosis not present

## 2015-11-03 DIAGNOSIS — M9902 Segmental and somatic dysfunction of thoracic region: Secondary | ICD-10-CM | POA: Diagnosis not present

## 2015-11-03 DIAGNOSIS — M9903 Segmental and somatic dysfunction of lumbar region: Secondary | ICD-10-CM | POA: Diagnosis not present

## 2015-11-03 DIAGNOSIS — M5414 Radiculopathy, thoracic region: Secondary | ICD-10-CM | POA: Diagnosis not present

## 2015-11-03 DIAGNOSIS — M5432 Sciatica, left side: Secondary | ICD-10-CM | POA: Diagnosis not present

## 2015-11-05 DIAGNOSIS — M5432 Sciatica, left side: Secondary | ICD-10-CM | POA: Diagnosis not present

## 2015-11-05 DIAGNOSIS — M5414 Radiculopathy, thoracic region: Secondary | ICD-10-CM | POA: Diagnosis not present

## 2015-11-05 DIAGNOSIS — M9903 Segmental and somatic dysfunction of lumbar region: Secondary | ICD-10-CM | POA: Diagnosis not present

## 2015-11-05 DIAGNOSIS — M9902 Segmental and somatic dysfunction of thoracic region: Secondary | ICD-10-CM | POA: Diagnosis not present

## 2015-11-08 ENCOUNTER — Encounter: Payer: BLUE CROSS/BLUE SHIELD | Admitting: Physical Therapy

## 2015-11-08 DIAGNOSIS — M9902 Segmental and somatic dysfunction of thoracic region: Secondary | ICD-10-CM | POA: Diagnosis not present

## 2015-11-08 DIAGNOSIS — M9903 Segmental and somatic dysfunction of lumbar region: Secondary | ICD-10-CM | POA: Diagnosis not present

## 2015-11-08 DIAGNOSIS — M5432 Sciatica, left side: Secondary | ICD-10-CM | POA: Diagnosis not present

## 2015-11-08 DIAGNOSIS — M5414 Radiculopathy, thoracic region: Secondary | ICD-10-CM | POA: Diagnosis not present

## 2015-11-09 DIAGNOSIS — M9903 Segmental and somatic dysfunction of lumbar region: Secondary | ICD-10-CM | POA: Diagnosis not present

## 2015-11-09 DIAGNOSIS — M5414 Radiculopathy, thoracic region: Secondary | ICD-10-CM | POA: Diagnosis not present

## 2015-11-09 DIAGNOSIS — M5432 Sciatica, left side: Secondary | ICD-10-CM | POA: Diagnosis not present

## 2015-11-09 DIAGNOSIS — M9902 Segmental and somatic dysfunction of thoracic region: Secondary | ICD-10-CM | POA: Diagnosis not present

## 2015-11-12 DIAGNOSIS — M9903 Segmental and somatic dysfunction of lumbar region: Secondary | ICD-10-CM | POA: Diagnosis not present

## 2015-11-12 DIAGNOSIS — M5414 Radiculopathy, thoracic region: Secondary | ICD-10-CM | POA: Diagnosis not present

## 2015-11-12 DIAGNOSIS — M9902 Segmental and somatic dysfunction of thoracic region: Secondary | ICD-10-CM | POA: Diagnosis not present

## 2015-11-12 DIAGNOSIS — M5432 Sciatica, left side: Secondary | ICD-10-CM | POA: Diagnosis not present

## 2015-11-15 ENCOUNTER — Encounter: Payer: BLUE CROSS/BLUE SHIELD | Admitting: Physical Therapy

## 2015-11-15 DIAGNOSIS — M5432 Sciatica, left side: Secondary | ICD-10-CM | POA: Diagnosis not present

## 2015-11-15 DIAGNOSIS — M9903 Segmental and somatic dysfunction of lumbar region: Secondary | ICD-10-CM | POA: Diagnosis not present

## 2015-11-15 DIAGNOSIS — M5414 Radiculopathy, thoracic region: Secondary | ICD-10-CM | POA: Diagnosis not present

## 2015-11-15 DIAGNOSIS — M9902 Segmental and somatic dysfunction of thoracic region: Secondary | ICD-10-CM | POA: Diagnosis not present

## 2015-11-17 ENCOUNTER — Encounter: Payer: BLUE CROSS/BLUE SHIELD | Admitting: Physical Therapy

## 2015-11-22 ENCOUNTER — Encounter: Payer: BLUE CROSS/BLUE SHIELD | Admitting: Physical Therapy

## 2015-12-07 DIAGNOSIS — H0011 Chalazion right upper eyelid: Secondary | ICD-10-CM | POA: Diagnosis not present

## 2016-01-07 DIAGNOSIS — N959 Unspecified menopausal and perimenopausal disorder: Secondary | ICD-10-CM | POA: Diagnosis not present

## 2016-01-07 DIAGNOSIS — Z0001 Encounter for general adult medical examination with abnormal findings: Secondary | ICD-10-CM | POA: Diagnosis not present

## 2016-01-07 DIAGNOSIS — E559 Vitamin D deficiency, unspecified: Secondary | ICD-10-CM | POA: Diagnosis not present

## 2016-01-07 DIAGNOSIS — I10 Essential (primary) hypertension: Secondary | ICD-10-CM | POA: Diagnosis not present

## 2016-01-19 DIAGNOSIS — J0111 Acute recurrent frontal sinusitis: Secondary | ICD-10-CM | POA: Diagnosis not present

## 2016-01-19 DIAGNOSIS — I679 Cerebrovascular disease, unspecified: Secondary | ICD-10-CM | POA: Diagnosis not present

## 2016-01-19 DIAGNOSIS — R51 Headache: Secondary | ICD-10-CM | POA: Diagnosis not present

## 2016-01-19 DIAGNOSIS — F900 Attention-deficit hyperactivity disorder, predominantly inattentive type: Secondary | ICD-10-CM | POA: Diagnosis not present

## 2016-02-16 ENCOUNTER — Other Ambulatory Visit: Payer: Self-pay | Admitting: Nurse Practitioner

## 2016-02-16 DIAGNOSIS — R519 Headache, unspecified: Secondary | ICD-10-CM

## 2016-02-16 DIAGNOSIS — R51 Headache: Principal | ICD-10-CM

## 2016-03-01 ENCOUNTER — Ambulatory Visit
Admission: RE | Admit: 2016-03-01 | Discharge: 2016-03-01 | Disposition: A | Discharge status: Home / Self Care | Payer: BLUE CROSS/BLUE SHIELD | Source: Ambulatory Visit | Attending: Nurse Practitioner | Admitting: Nurse Practitioner

## 2016-03-01 DIAGNOSIS — R51 Headache: Secondary | ICD-10-CM | POA: Diagnosis not present

## 2016-03-01 DIAGNOSIS — R519 Headache, unspecified: Secondary | ICD-10-CM

## 2016-04-07 DIAGNOSIS — D485 Neoplasm of uncertain behavior of skin: Secondary | ICD-10-CM | POA: Diagnosis not present

## 2016-04-07 DIAGNOSIS — D2239 Melanocytic nevi of other parts of face: Secondary | ICD-10-CM | POA: Diagnosis not present

## 2016-04-07 DIAGNOSIS — D224 Melanocytic nevi of scalp and neck: Secondary | ICD-10-CM | POA: Diagnosis not present

## 2016-04-07 DIAGNOSIS — Q828 Other specified congenital malformations of skin: Secondary | ICD-10-CM | POA: Diagnosis not present

## 2016-04-07 DIAGNOSIS — L570 Actinic keratosis: Secondary | ICD-10-CM | POA: Diagnosis not present

## 2016-04-07 DIAGNOSIS — D2261 Melanocytic nevi of right upper limb, including shoulder: Secondary | ICD-10-CM | POA: Diagnosis not present

## 2016-04-20 DIAGNOSIS — F900 Attention-deficit hyperactivity disorder, predominantly inattentive type: Secondary | ICD-10-CM | POA: Diagnosis not present

## 2016-04-20 DIAGNOSIS — F411 Generalized anxiety disorder: Secondary | ICD-10-CM | POA: Diagnosis not present

## 2016-04-20 DIAGNOSIS — I679 Cerebrovascular disease, unspecified: Secondary | ICD-10-CM | POA: Diagnosis not present

## 2016-04-20 DIAGNOSIS — I10 Essential (primary) hypertension: Secondary | ICD-10-CM | POA: Diagnosis not present

## 2016-05-25 DIAGNOSIS — R51 Headache: Secondary | ICD-10-CM | POA: Diagnosis not present

## 2016-05-25 DIAGNOSIS — I10 Essential (primary) hypertension: Secondary | ICD-10-CM | POA: Diagnosis not present

## 2016-05-25 DIAGNOSIS — J029 Acute pharyngitis, unspecified: Secondary | ICD-10-CM | POA: Diagnosis not present

## 2016-05-29 DIAGNOSIS — J301 Allergic rhinitis due to pollen: Secondary | ICD-10-CM | POA: Diagnosis not present

## 2016-05-29 DIAGNOSIS — J329 Chronic sinusitis, unspecified: Secondary | ICD-10-CM | POA: Diagnosis not present

## 2016-05-29 DIAGNOSIS — J342 Deviated nasal septum: Secondary | ICD-10-CM | POA: Diagnosis not present

## 2016-06-09 ENCOUNTER — Encounter: Payer: Self-pay | Admitting: Certified Nurse Midwife

## 2016-06-09 DIAGNOSIS — Z1231 Encounter for screening mammogram for malignant neoplasm of breast: Secondary | ICD-10-CM | POA: Diagnosis not present

## 2016-06-28 DIAGNOSIS — H53462 Homonymous bilateral field defects, left side: Secondary | ICD-10-CM | POA: Diagnosis not present

## 2016-06-28 DIAGNOSIS — J301 Allergic rhinitis due to pollen: Secondary | ICD-10-CM | POA: Diagnosis not present

## 2016-07-14 DIAGNOSIS — I10 Essential (primary) hypertension: Secondary | ICD-10-CM | POA: Diagnosis not present

## 2016-07-14 DIAGNOSIS — J300 Vasomotor rhinitis: Secondary | ICD-10-CM | POA: Diagnosis not present

## 2016-07-14 DIAGNOSIS — F411 Generalized anxiety disorder: Secondary | ICD-10-CM | POA: Diagnosis not present

## 2016-07-14 DIAGNOSIS — F900 Attention-deficit hyperactivity disorder, predominantly inattentive type: Secondary | ICD-10-CM | POA: Diagnosis not present

## 2016-10-03 DIAGNOSIS — H16041 Marginal corneal ulcer, right eye: Secondary | ICD-10-CM | POA: Diagnosis not present

## 2016-10-04 DIAGNOSIS — Q828 Other specified congenital malformations of skin: Secondary | ICD-10-CM | POA: Diagnosis not present

## 2016-10-04 DIAGNOSIS — L570 Actinic keratosis: Secondary | ICD-10-CM | POA: Diagnosis not present

## 2016-10-04 DIAGNOSIS — D485 Neoplasm of uncertain behavior of skin: Secondary | ICD-10-CM | POA: Diagnosis not present

## 2016-10-04 DIAGNOSIS — L814 Other melanin hyperpigmentation: Secondary | ICD-10-CM | POA: Diagnosis not present

## 2016-10-04 DIAGNOSIS — H16041 Marginal corneal ulcer, right eye: Secondary | ICD-10-CM | POA: Diagnosis not present

## 2016-10-04 DIAGNOSIS — I788 Other diseases of capillaries: Secondary | ICD-10-CM | POA: Diagnosis not present

## 2016-10-06 DIAGNOSIS — H16041 Marginal corneal ulcer, right eye: Secondary | ICD-10-CM | POA: Diagnosis not present

## 2016-10-13 DIAGNOSIS — H16041 Marginal corneal ulcer, right eye: Secondary | ICD-10-CM | POA: Diagnosis not present

## 2016-10-17 DIAGNOSIS — J300 Vasomotor rhinitis: Secondary | ICD-10-CM | POA: Diagnosis not present

## 2016-10-17 DIAGNOSIS — J019 Acute sinusitis, unspecified: Secondary | ICD-10-CM | POA: Diagnosis not present

## 2016-10-17 DIAGNOSIS — B373 Candidiasis of vulva and vagina: Secondary | ICD-10-CM | POA: Diagnosis not present

## 2016-10-17 DIAGNOSIS — I10 Essential (primary) hypertension: Secondary | ICD-10-CM | POA: Diagnosis not present

## 2016-11-09 ENCOUNTER — Encounter: Payer: Self-pay | Admitting: Certified Nurse Midwife

## 2016-11-09 NOTE — Progress Notes (Signed)
Gynecology Annual Exam  PCP: Ronnell Freshwater, NP  Chief Complaint:  Chief Complaint  Patient presents with  . Gynecologic Exam    History of Present Illness:Jillian Edwards is a 51 year old Caucasian/White female, G1 P1001, who presents for her annual exam. She is having several concerns which include a tender lesion on her inner right thigh near her panty line which she noticed about a month ago. This area has gotten smaller and has not drained any fluid. She also would like to be checked for a yeast infection since taking 2 courses of antibiotics for a sinusitis recently. She denies vulvovaginal itching. Did take a Diflucan pill prescribed by her PCP to prevent a yeast infection. She also complains of pain in her left hip radiating down her left leg. It bothers her to lie on her left or right side and has difficulty falling asleep.Has not taken any analgesics for the pain.    She has intermittent irregular spotting with her Mirena IUD (brown discharge) She denies dysmenorrhea.  The patient's past medical history is notable for a history of a stroke in 2006, depression/anxiety, hypertension, a remote hx of CIN I, and HSVII. She uses Valtrex episodically, has not needed in a while. Desires a refill of Valtrex.  Since her last annual GYN exam dated 08/25/2015 , she has had no other significant changes to her health.  She is sexually active. She is currently using an IUD for contraception. Mirena IUD was placed on 04/30/2012.  Her most recent pap smear was obtained 08/25/2015 and was negative.  Her most recent mammogram obtained on 05/24/2015 was normal. Has her mammograms at [facility]. She thinks that she had a mammogram earlier this year also. She will check her records and schedule a mammogram if due. Hx of breast augmentation in 2005 and a stereotactic biopsy of calcifications in 01/2013 which was benign.  There is a positive history of breast cancer in her maternal grandmother. Genetic  testing has not been done.  There is no family history of ovarian cancer.  The patient does  do monthly self breast exams.  The patient does not smoke.  The patient does not drink alcohol.  The patient does not use illegal drugs.  The patient exercises regularly.  The patient does get adequate calcium in her diet and with her Tums supplement  She had a normal lipid panel in 2017 at her PCP Dr Chancy Milroy Nira Conn [facility].   Review of Systems: Review of Systems  Constitutional: Negative for chills, fever and weight loss.  HENT: Negative for congestion, sinus pain and sore throat.   Eyes: Negative for blurred vision and pain.  Respiratory: Negative for hemoptysis, shortness of breath and wheezing.   Cardiovascular: Negative for chest pain, palpitations and leg swelling.  Gastrointestinal: Positive for constipation and heartburn. Negative for abdominal pain, blood in stool, diarrhea, nausea and vomiting.  Genitourinary: Negative for dysuria, frequency, hematuria and urgency.       Negative for vulvovaginal itching  Musculoskeletal: Positive for myalgias (left hip pain). Negative for back pain and joint pain.  Skin: Negative for itching and rash.       Positive for a tender lesion on her inner right thigh  Neurological: Negative for dizziness, tingling and headaches.  Endo/Heme/Allergies: Negative for environmental allergies and polydipsia. Does not bruise/bleed easily.       Negative for hirsutism   Psychiatric/Behavioral: Negative for depression. The patient is nervous/anxious and has insomnia.  Past Medical History:  Past Medical History:  Diagnosis Date  . ADD (attention deficit disorder)   . Anxiety   . Depression   . Frequent urination   . Herpes genitalis   . Hypertension   . Stress headaches   . Stroke ([place]) ~2005    loss of peripheral vision  . Stroke or transient ischemic attack (TIA) diagnosed during current admission   . 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 BREAST BIOPSY Left 02/06/2013   stereotactic biopsy of calcifications-benign  . BREAST ENHANCEMENT SURGERY  08/2003  . COLPOSCOPY  03/1998; 07/1999   2000-no lesion; 2001-CIN1  . ENDOMETRIAL BIOPSY  03/2007   proliferative    Family History:  Family History  Problem Relation Age of Onset  . Hypertension Father   . Diabetes Father        Has also had an amputation  . Heart attack Father   . Diabetes Mellitus II Brother   . Hypertension Brother   . Breast cancer Maternal Grandmother   . Diabetes Mellitus II Paternal Grandmother     Social History:  Social History   Social History  . Marital status: Legally Separated    Spouse name: N/A  . Number of children: 1  . 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 Smoking status: Never Smoker  . Smokeless tobacco: Never Used  . Alcohol use No  . Drug use: No  . Sexual activity: Yes    Birth control/ protection: IUD   Other Topics Concern  . Not on file   Social History Narrative  . No narrative on file    Allergies:  No Known Allergies  Medications: Current Outpatient Prescriptions:  .  ALPRAZolam (XANAX) 0.5 MG tablet, TAKE 1/2-1 TABLET BY MOUTH TWICE DAILY AS NEEDED FOR ANXIETY, Disp: , Rfl: 3 .  amphetamine-dextroamphetamine (ADDERALL) 15 MG tablet, TAKE 1 TABLET BY MOUTH TWICE A DAY AS NEEDED FOCUS **DNF 09/09/16**, Disp: , Rfl: 0 .  aspirin EC 81 MG tablet, Take 81 mg by mouth daily., Disp: , Rfl:  .  buPROPion (WELLBUTRIN XL) 150 MG 24 hr tablet, , Disp: , Rfl: 0 .  busPIRone (BUSPAR) 7.5 MG tablet, , Disp: , Rfl: 0 .  calcium carbonate (TUMS - DOSED IN MG ELEMENTAL CALCIUM) 500 MG chewable tablet, Chew 1 tablet by mouth daily., Disp: , Rfl:  .  cholecalciferol (VITAMIN D) 1000 units tablet, Take 1,000 Units by mouth daily., Disp: , Rfl:  .  hydrochlorothiazide  (HYDRODIURIL) 25 MG tablet, , Disp: , Rfl: 0 .  levonorgestrel (MIRENA) 20 MCG/24HR IUD, 1 each by Intrauterine route once., Disp: , Rfl:  .  meloxicam (MOBIC) 7.5 MG tablet, Take 1 tablet (7.5 mg total) by mouth daily., Disp: 30 tablet, Rfl: 1 .  terconazole (TERAZOL 3) 80 MG vaginal suppository, Place 1 suppository (80 mg total) vaginally at bedtime., Disp: 3 suppository, Rfl: 0 .  valACYclovir (VALTREX) 1000 MG tablet, Take 0.5 tablets (500 mg total) by mouth 2 (two) times daily as needed., Disp: 30 tablet, Rfl: 1 Physical Exam Vitals: BP 112/72   Pulse 73   Ht 5\' 7"  (1.702 m)   Wt 163 lb (73.9 kg)   LMP 09/18/2016 (Exact Date)   BMI 25.53 kg/m   General: WF in NAD HEENT: normocephalic, anicteric Neck: no thyroid  enlargement, no palpable nodules, no cervical lymphadenopathy  Pulmonary: No increased work of breathing, CTAB Cardiovascular: RRR, without murmur  Breast: Breast symmetrical, no tenderness, no palpable nodules or masses, no skin or nipple retraction present, no nipple discharge.  No axillary, infraclavicular or supraclavicular lymphadenopathy. Abdomen: Soft, non-tender, non-distended.  Umbilicus without lesions.  No hepatomegaly or masses palpable. No evidence of hernia. Genitourinary:  External: Normal external female genitalia.  Normal urethral meatus, normal Bartholin's and Skene's glands.    Vagina: Normal vaginal mucosa, white mucoepithelial discharge   Cervix: Grossly normal in appearance, no bleeding, non-tender, IUD string present  Uterus: Anteverted, normal size, shape, and consistency, mobile, and non-tender  Adnexa: No adnexal masses, non-tender  Rectal: deferred  Lymphatic: no evidence of inguinal lymphadenopathy Extremities: no edema, erythema, or tenderness Neurologic: Grossly intact Psychiatric: mood appropriate, affect full Skin: hyperpigmented 1 cm macular lesion on right upper thigh near the panty line, minimally tender, no induration or inflammation  seen.  Results for orders placed or performed in visit on 11/10/16 (from the past 24 hour(s))  POCT Wet Prep [facility] [place])     Status: Abnormal   Collection Time: 11/11/16 11:22 AM  Result Value Ref Range   Source Wet Prep POC vaginal    WBC, Wet Prep HPF POC     Bacteria Wet Prep HPF POC  Few   BACTERIA WET PREP MORPHOLOGY POC     Clue Cells Wet Prep HPF POC None None   Clue Cells Wet Prep Whiff POC     Yeast Wet Prep HPF POC Many    KOH Wet Prep POC     Trichomonas Wet Prep HPF POC Absent Absent     Assessment: 51 y.o. annual gyn exam Macular lesion upper right thigh-appears to be resolving lesion Monilial vaginitis  Terconazole suppositories-qhs x 3 nights Probable MSK pain left hip/leg (?bursitis)  Mobic 7.5 mgm daily for pain  To take with food  FU with PCP if not improved or resolved over the next few weeks Hx of HSV II-refill of Valtrex sent to pharmacy   Plan:    1) Breast cancer screening - recommend monthly self breast exam and annual mammograms Patient to schedule mammogram if due  2) Colon cancer screen- her PCP has already consulted [facility] for colonoscopy. Advised to call GI clinic to schedule appt  3) Cervical cancer screening - Pap was done. ASCCP guidelines and rational discussed.  Patient opts for yearly screening interval  4) Contraception -Mirena will expire next year. Will get FSH, LH next year and if elevated will leave Mirena in for another year. If normal, discuss replacing Mirena vs use of another form of birth control  5) Routine healthcare maintenance including cholesterol and diabetes screening managed by PCP   Dalia Heading, CNM

## 2016-11-10 ENCOUNTER — Encounter: Payer: Self-pay | Admitting: Certified Nurse Midwife

## 2016-11-10 ENCOUNTER — Ambulatory Visit (INDEPENDENT_AMBULATORY_CARE_PROVIDER_SITE_OTHER): Payer: Managed Care, Other (non HMO) | Admitting: Certified Nurse Midwife

## 2016-11-10 VITALS — BP 112/72 | HR 73 | Ht 67.0 in | Wt 163.0 lb

## 2016-11-10 DIAGNOSIS — A6000 Herpesviral infection of urogenital system, unspecified: Secondary | ICD-10-CM | POA: Diagnosis not present

## 2016-11-10 DIAGNOSIS — M25552 Pain in left hip: Secondary | ICD-10-CM | POA: Diagnosis not present

## 2016-11-10 DIAGNOSIS — Z01419 Encounter for gynecological examination (general) (routine) without abnormal findings: Secondary | ICD-10-CM | POA: Diagnosis not present

## 2016-11-10 DIAGNOSIS — B3731 Acute candidiasis of vulva and vagina: Secondary | ICD-10-CM

## 2016-11-10 DIAGNOSIS — Z124 Encounter for screening for malignant neoplasm of cervix: Secondary | ICD-10-CM | POA: Diagnosis not present

## 2016-11-10 DIAGNOSIS — B373 Candidiasis of vulva and vagina: Secondary | ICD-10-CM

## 2016-11-10 DIAGNOSIS — N898 Other specified noninflammatory disorders of vagina: Secondary | ICD-10-CM

## 2016-11-10 MED ORDER — TERCONAZOLE 80 MG VA SUPP: 80.0000 mg | Freq: Every day | VAGINAL | 0 refills | Status: DC

## 2016-11-10 MED ORDER — VALACYCLOVIR HCL 1 G PO TABS: 500.0000 mg | ORAL_TABLET | Freq: Two times a day (BID) | ORAL | 1 refills | Status: AC | PRN

## 2016-11-10 MED ORDER — MELOXICAM 7.5 MG PO TABS: 7.5000 mg | ORAL_TABLET | Freq: Every day | ORAL | 1 refills | Status: DC

## 2016-11-11 ENCOUNTER — Encounter: Payer: Self-pay | Admitting: Certified Nurse Midwife

## 2016-11-11 DIAGNOSIS — A6000 Herpesviral infection of urogenital system, unspecified: Secondary | ICD-10-CM | POA: Insufficient documentation

## 2016-11-11 DIAGNOSIS — I10 Essential (primary) hypertension: Secondary | ICD-10-CM | POA: Insufficient documentation

## 2016-11-11 DIAGNOSIS — F419 Anxiety disorder, unspecified: Secondary | ICD-10-CM | POA: Insufficient documentation

## 2016-11-11 DIAGNOSIS — F988 Other specified behavioral and emotional disorders with onset usually occurring in childhood and adolescence: Secondary | ICD-10-CM | POA: Insufficient documentation

## 2016-11-11 DIAGNOSIS — F32A Depression, unspecified: Secondary | ICD-10-CM | POA: Insufficient documentation

## 2016-11-11 DIAGNOSIS — I639 Cerebral infarction, unspecified: Secondary | ICD-10-CM | POA: Insufficient documentation

## 2016-11-11 DIAGNOSIS — F329 Major depressive disorder, single episode, unspecified: Secondary | ICD-10-CM | POA: Insufficient documentation

## 2016-11-11 LAB — POCT WET PREP (WET MOUNT): TRICHOMONAS WET PREP HPF POC: ABSENT

## 2016-11-14 LAB — IGP,RFX APTIMA HPV ALL PTH: PAP Smear Comment: 0

## 2016-11-28 ENCOUNTER — Telehealth: Payer: Self-pay | Admitting: Gastroenterology

## 2016-11-28 ENCOUNTER — Other Ambulatory Visit: Payer: Self-pay

## 2016-11-28 DIAGNOSIS — Z1211 Encounter for screening for malignant neoplasm of colon: Secondary | ICD-10-CM

## 2016-11-28 NOTE — Telephone Encounter (Signed)
Gastroenterology Pre-Procedure Review  Request Date: 9/19 Requesting Physician: Dr. Marius Ditch  PATIENT REVIEW QUESTIONS: The patient responded to the following health history questions as indicated:    *No major illnesses at this time  1. Are you having any GI issues? no 2. Do you have a personal history of Polyps? no 3. Do you have a family history of Colon Cancer or Polyps? no 4. Diabetes Mellitus? no 5. Joint replacements in the past 12 months?no 6. Major health problems in the past 3 months?no 7. Any artificial heart valves, MVP, or defibrillator?no    MEDICATIONS & ALLERGIES:    Patient reports the following regarding taking any anticoagulation/antiplatelet therapy:   Plavix, Coumadin, Eliquis, Xarelto, Lovenox, Pradaxa, Brilinta, or Effient? no Aspirin? yes (81mg )  Patient confirms/reports the following medications:  Current Outpatient Prescriptions  Medication Sig Dispense Refill  . ALPRAZolam (XANAX) 0.5 MG tablet TAKE 1/2-1 TABLET BY MOUTH TWICE DAILY AS NEEDED FOR ANXIETY  3  . amphetamine-dextroamphetamine (ADDERALL) 15 MG tablet TAKE 1 TABLET BY MOUTH TWICE A DAY AS NEEDED FOCUS **DNF 09/09/16**  0  . aspirin EC 81 MG tablet Take 81 mg by mouth daily.    Marland Kitchen buPROPion (WELLBUTRIN XL) 150 MG 24 hr tablet   0  . busPIRone (BUSPAR) 7.5 MG tablet   0  . calcium carbonate (TUMS - DOSED IN MG ELEMENTAL CALCIUM) 500 MG chewable tablet Chew 1 tablet by mouth daily.    . cholecalciferol (VITAMIN D) 1000 units tablet Take 1,000 Units by mouth daily.    . hydrochlorothiazide (HYDRODIURIL) 25 MG tablet   0  . levonorgestrel (MIRENA) 20 MCG/24HR IUD 1 each by Intrauterine route once.    . meloxicam (MOBIC) 7.5 MG tablet Take 1 tablet (7.5 mg total) by mouth daily. 30 tablet 1  . terconazole (TERAZOL 3) 80 MG vaginal suppository Place 1 suppository (80 mg total) vaginally at bedtime. 3 suppository 0   No current facility-administered medications for this visit.     Patient  confirms/reports the following allergies:  No Known Allergies  No orders of the defined types were placed in this encounter.   AUTHORIZATION INFORMATION Primary Insurance: 1D#: Group #:  Secondary Insurance: 1D#: Group #:  SCHEDULE INFORMATION: Date: 9/19 Time: Location: [place]

## 2016-11-28 NOTE — Telephone Encounter (Signed)
Patient Jillian Edwards to call her back to schedule her colonoscopy.

## 2016-12-11 ENCOUNTER — Telehealth: Payer: Self-pay | Admitting: Gastroenterology

## 2016-12-11 NOTE — Telephone Encounter (Signed)
Patient is returning a call regarding procedure

## 2016-12-11 NOTE — Telephone Encounter (Signed)
Patient called back needing to change her procedure day. Please call today

## 2016-12-12 NOTE — Telephone Encounter (Signed)
Patient stated that she will keep her appt for the colonoscopy as scheduled. Thanks [employer]

## 2016-12-13 ENCOUNTER — Encounter: Payer: Self-pay | Admitting: *Deleted

## 2016-12-14 ENCOUNTER — Encounter
Admission: RE | Disposition: A | Discharge status: Home / Self Care | Payer: Self-pay | Source: Ambulatory Visit | Attending: Gastroenterology

## 2016-12-14 ENCOUNTER — Ambulatory Visit: Payer: BLUE CROSS/BLUE SHIELD | Admitting: Anesthesiology

## 2016-12-14 ENCOUNTER — Ambulatory Visit
Admission: RE | Admit: 2016-12-14 | Discharge: 2016-12-14 | Disposition: A | Discharge status: Home / Self Care | Payer: BLUE CROSS/BLUE SHIELD | Source: Ambulatory Visit | Attending: Gastroenterology | Admitting: Gastroenterology

## 2016-12-14 ENCOUNTER — Encounter: Payer: Self-pay | Admitting: *Deleted

## 2016-12-14 DIAGNOSIS — F419 Anxiety disorder, unspecified: Secondary | ICD-10-CM | POA: Insufficient documentation

## 2016-12-14 DIAGNOSIS — F329 Major depressive disorder, single episode, unspecified: Secondary | ICD-10-CM | POA: Insufficient documentation

## 2016-12-14 DIAGNOSIS — Z1211 Encounter for screening for malignant neoplasm of colon: Secondary | ICD-10-CM

## 2016-12-14 DIAGNOSIS — Z79899 Other long term (current) drug therapy: Secondary | ICD-10-CM | POA: Diagnosis not present

## 2016-12-14 DIAGNOSIS — Z8673 Personal history of transient ischemic attack (TIA), and cerebral infarction without residual deficits: Secondary | ICD-10-CM | POA: Insufficient documentation

## 2016-12-14 DIAGNOSIS — Z7982 Long term (current) use of aspirin: Secondary | ICD-10-CM | POA: Insufficient documentation

## 2016-12-14 DIAGNOSIS — I10 Essential (primary) hypertension: Secondary | ICD-10-CM | POA: Diagnosis not present

## 2016-12-14 HISTORY — PX: COLONOSCOPY WITH PROPOFOL: SHX5780

## 2016-12-14 SURGERY — COLONOSCOPY WITH PROPOFOL
Anesthesia: General

## 2016-12-14 MED ORDER — FENTANYL CITRATE (PF) 100 MCG/2ML IJ SOLN
INTRAMUSCULAR | Status: DC | PRN
  Administered 2016-12-14: 25 ug via INTRAVENOUS
  Administered 2016-12-14: 50 ug via INTRAVENOUS
  Administered 2016-12-14: 25 ug via INTRAVENOUS

## 2016-12-14 MED ORDER — MIDAZOLAM HCL 2 MG/2ML IJ SOLN
INTRAMUSCULAR | Status: AC
  Filled 2016-12-14: qty 2

## 2016-12-14 MED ORDER — SODIUM CHLORIDE 0.9 % IV SOLN
INTRAVENOUS | Status: DC | PRN
  Administered 2016-12-14: 13:00:00 via INTRAVENOUS

## 2016-12-14 MED ORDER — PROPOFOL 500 MG/50ML IV EMUL
INTRAVENOUS | Status: DC | PRN
  Administered 2016-12-14: 120 ug/kg/min via INTRAVENOUS

## 2016-12-14 MED ORDER — PROPOFOL 10 MG/ML IV BOLUS
INTRAVENOUS | Status: DC | PRN
  Administered 2016-12-14: 50 mg via INTRAVENOUS

## 2016-12-14 MED ORDER — FENTANYL CITRATE (PF) 100 MCG/2ML IJ SOLN
INTRAMUSCULAR | Status: AC
  Filled 2016-12-14: qty 2

## 2016-12-14 MED ORDER — PROPOFOL 500 MG/50ML IV EMUL
INTRAVENOUS | Status: AC
  Filled 2016-12-14: qty 50

## 2016-12-14 MED ORDER — MIDAZOLAM HCL 2 MG/2ML IJ SOLN
INTRAMUSCULAR | Status: DC | PRN
  Administered 2016-12-14: 2 mg via INTRAVENOUS

## 2016-12-14 MED ORDER — SODIUM CHLORIDE 0.9 % IV SOLN
INTRAVENOUS | Status: DC
  Administered 2016-12-14: 13:00:00 via INTRAVENOUS

## 2016-12-14 NOTE — Anesthesia Preprocedure Evaluation (Signed)
Anesthesia Evaluation  Patient identified by MRN, date of birth, ID band Patient awake    Reviewed: Allergy & Precautions, NPO status , Patient's Chart, lab work & pertinent test results, reviewed documented beta blocker date and time   Airway Mallampati: II  TM Distance: >3 FB     Dental  (+) Chipped   Pulmonary           Cardiovascular hypertension, Pt. on medications      Neuro/Psych PSYCHIATRIC DISORDERS Anxiety Depression CVA    GI/Hepatic   Endo/Other    Renal/GU      Musculoskeletal   Abdominal   Peds  Hematology   Anesthesia Other Findings CVA 10 yrs ago. OK since then. ADD.  Reproductive/Obstetrics                             Anesthesia Physical Anesthesia Plan  ASA: II  Anesthesia Plan: General   Post-op Pain Management:    Induction: Intravenous  PONV Risk Score and Plan:   Airway Management Planned:   Additional Equipment:   Intra-op Plan:   Post-operative Plan:   Informed Consent: I have reviewed the patients History and Physical, chart, labs and discussed the procedure including the risks, benefits and alternatives for the proposed anesthesia with the patient or authorized representative who has indicated his/her understanding and acceptance.     Plan Discussed with: CRNA  Anesthesia Plan Comments:         Anesthesia Quick Evaluation

## 2016-12-14 NOTE — Anesthesia Procedure Notes (Signed)
Performed by: COOK-MARTIN, Albirta Rhinehart Pre-anesthesia Checklist: Patient identified, Emergency Drugs available, Suction available, Patient being monitored and Timeout performed Patient Re-evaluated:Patient Re-evaluated prior to induction Oxygen Delivery Method: Nasal cannula Preoxygenation: Pre-oxygenation with 100% oxygen Induction Type: IV induction Placement Confirmation: positive ETCO2 and CO2 detector       

## 2016-12-14 NOTE — H&P (Signed)
Jillian Bellows MD [address] [place], [place] [address] Phone: [phone] Fax : [phone]  Primary Care Physician:  Ronnell Freshwater, NP Primary Gastroenterologist:  Dr. Jonathon Edwards   Pre-Procedure History & Physical: HPI:  Jillian Edwards is a 51 y.o. female is here for an colonoscopy.   Past Medical History:  Diagnosis Date  . ADD (attention deficit disorder)   . Anxiety   . Depression   . Frequent urination   . Herpes genitalis   . Hypertension   . Stress headaches   . Stroke ([place]) ~2005    loss of peripheral vision  . Stroke or transient ischemic attack (TIA) diagnosed during current admission   . 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 BREAST BIOPSY Left 02/06/2013   stereotactic biopsy of calcifications-benign  . BREAST ENHANCEMENT SURGERY  08/2003  . COLPOSCOPY  03/1998; 07/1999   2000-no lesion; 2001-CIN1  . ENDOMETRIAL BIOPSY  03/2007   proliferative    Prior to Admission medications   Medication Sig Start Date End Date Taking? Authorizing Provider  ALPRAZolam (XANAX) 0.5 MG tablet TAKE 1/2-1 TABLET BY MOUTH TWICE DAILY AS NEEDED FOR ANXIETY 04/28/15  Yes [provider]  amphetamine-dextroamphetamine (ADDERALL) 15 MG tablet TAKE 1 TABLET BY MOUTH TWICE A DAY AS NEEDED FOCUS **DNF 09/09/16** 10/31/16  Yes [provider]  aspirin EC 81 MG tablet Take 81 mg by mouth daily.   Yes [provider]  buPROPion (WELLBUTRIN XL) 150 MG 24 hr tablet  07/26/15  Yes [provider]  busPIRone (BUSPAR) 7.5 MG tablet  07/26/15  Yes [provider]  calcium carbonate (TUMS - DOSED IN MG ELEMENTAL CALCIUM) 500 MG chewable tablet Chew 1 tablet by mouth daily.   Yes [provider]  cholecalciferol (VITAMIN D) 1000 units tablet Take 1,000 Units by mouth daily.   Yes [provider]  hydrochlorothiazide (HYDRODIURIL) 25 MG tablet  07/26/15  Yes [provider]  levonorgestrel (MIRENA) 20 MCG/24HR IUD 1 each by Intrauterine route once.   Yes [provider]  meloxicam (MOBIC) 7.5 MG tablet Take 1 tablet (7.5 mg total) by mouth daily. 11/10/16  Yes Dalia Heading, CNM  terconazole (TERAZOL 3) 80 MG vaginal suppository Place 1 suppository (80 mg total) vaginally at bedtime. 11/10/16  Yes Dalia Heading, CNM    Allergies as of 11/28/2016  . (No Known Allergies)    Family History  Problem Relation Age of Onset  . Hypertension Father   . Diabetes Father        Has also had an amputation  . Heart attack Father   . Diabetes Mellitus II Brother   . Hypertension Brother   . Breast cancer Maternal Grandmother   . Diabetes Mellitus II Paternal Grandmother     Social History   Social History  . Marital status: Legally Separated    Spouse name: N/A  . Number of children: 1  . 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 Smoking status: Never Smoker  . Smokeless tobacco: Never Used  . Alcohol use No  . Drug use: No  . Sexual activity: Yes    Birth control/ protection: IUD   Other Topics Concern  . Not on file   Social History Narrative  . No narrative on file    Review of Systems: See HPI,  otherwise negative ROS  Physical Exam: BP (!) 158/81   Pulse 96   Temp 97.6 F (36.4 C) (Tympanic)   Resp 20   Ht 5\' 7"  (1.702 m)   Wt 165 lb (74.8 kg)   LMP 11/13/2016 (Approximate)   SpO2 100%   BMI 25.84 kg/m  General:   Alert,  pleasant and cooperative in NAD Head:  Normocephalic and atraumatic. Neck:  Supple; no masses or thyromegaly. Lungs:  Clear throughout to auscultation.    Heart:  Regular rate and rhythm. Abdomen:  Soft, nontender and nondistended. Normal bowel sounds, without guarding, and without rebound.   Neurologic:  Alert and  oriented x4;  grossly normal neurologically.  Impression/Plan: [employer] is here for an  colonoscopy to be performed for Screening colonoscopy average risk    Risks, benefits, limitations, and alternatives regarding  colonoscopy have been reviewed with the patient.  Questions have been answered.  All parties agreeable.   Jillian Bellows, MD  12/14/2016, 12:39 PM

## 2016-12-14 NOTE — Anesthesia Post-op Follow-up Note (Signed)
Anesthesia QCDR form completed.        

## 2016-12-14 NOTE — Op Note (Signed)
[facility] Gastroenterology Patient Name: Jillian Edwards Procedure Date: 12/14/2016 12:48 PM MRN: [id number] Account #: [id number] Date of Birth: January 12, 1966 Admit Type: Outpatient Age: 51 Room: [facility] ENDO ROOM 3 Gender: Female Note Status: Finalized Procedure:            Colonoscopy Indications:          Screening for colorectal malignant neoplasm Providers:            Jonathon Bellows MD, MD Referring MD:         No Local Md, MD (Referring MD) Medicines:            Monitored Anesthesia Care Complications:        No immediate complications. Procedure:            Pre-Anesthesia Assessment:                       - Prior to the procedure, a History and Physical was                        performed, and patient medications, allergies and                        sensitivities were reviewed. The patient's tolerance of                        previous anesthesia was reviewed.                       - The risks and benefits of the procedure and the                        sedation options and risks were discussed with the                        patient. All questions were answered and informed                        consent was obtained.                       - ASA Grade Assessment: II - A patient with mild                        systemic disease.                       After obtaining informed consent, the colonoscope was                        passed under direct vision. Throughout the procedure,                        the patient's blood pressure, pulse, and oxygen                        saturations were monitored continuously. The                        Colonoscope was introduced through the anus and  advanced to the the cecum, identified by the                        appendiceal orifice, IC valve and transillumination.                        The colonoscopy was performed with ease. The patient                        tolerated the procedure well. The  quality of the bowel                        preparation was good. Findings:      The entire examined colon appeared normal on direct and retroflexion       views.      The entire examined colon appeared normal. Impression:           - The entire examined colon is normal on direct and                        retroflexion views.                       - The entire examined colon is normal.                       - No specimens collected. Recommendation:       - Discharge patient to home (with escort).                       - Resume previous diet.                       - Continue present medications.                       - Repeat colonoscopy in 10 years for screening purposes. Procedure Code(s):    --- Professional ---                       D2202, Colorectal cancer screening; colonoscopy on                        individual not meeting criteria for high risk Diagnosis Code(s):    --- Professional ---                       Z12.11, Encounter for screening for malignant neoplasm                        of colon CPT copyright 2016 American Medical Association. All rights reserved. The codes documented in this report are preliminary and upon coder review may  be revised to meet current compliance requirements. Jonathon Bellows, MD Jonathon Bellows MD, MD 12/14/2016 1:17:35 PM This report has been signed electronically. Number of Addenda: 0 Note Initiated On: 12/14/2016 12:48 PM Scope Withdrawal Time: 0 hours 11 minutes 45 seconds  Total Procedure Duration: 0 hours 22 minutes 30 seconds       [facility]

## 2016-12-14 NOTE — Transfer of Care (Signed)
Immediate Anesthesia Transfer of Care Note  Patient: Jillian Edwards  Procedure(s) Performed: Procedure(s): COLONOSCOPY WITH PROPOFOL (N/A)  Patient Location: PACU  Anesthesia Type:General  Level of Consciousness: awake and sedated  Airway & Oxygen Therapy: Patient Spontanous Breathing and Patient connected to nasal cannula oxygen  Post-op Assessment: Report given to RN and Post -op Vital signs reviewed and stable  Post vital signs: Reviewed and stable  Last Vitals:  Vitals:   12/14/16 1225  BP: (!) 158/81  Pulse: 96  Resp: 20  Temp: 36.4 C  SpO2: 100%    Last Pain:  Vitals:   12/14/16 1225  TempSrc: Tympanic         Complications: No apparent anesthesia complications

## 2016-12-15 ENCOUNTER — Encounter: Payer: Self-pay | Admitting: Gastroenterology

## 2016-12-15 NOTE — Anesthesia Postprocedure Evaluation (Signed)
Anesthesia Post Note  Patient: Maxcine G Stoermer  Procedure(s) Performed: Procedure(s) (LRB): COLONOSCOPY WITH PROPOFOL (N/A)  Patient location during evaluation: Endoscopy Anesthesia Type: General Level of consciousness: awake and alert Pain management: pain level controlled Vital Signs Assessment: post-procedure vital signs reviewed and stable Respiratory status: spontaneous breathing, nonlabored ventilation, respiratory function stable and patient connected to nasal cannula oxygen Cardiovascular status: blood pressure returned to baseline and stable Postop Assessment: no apparent nausea or vomiting Anesthetic complications: no     Last Vitals:  Vitals:   12/14/16 1330 12/14/16 1340  BP: (!) 127/97 (!) 130/94  Pulse: 81 80  Resp: 14 15  Temp:    SpO2: 100% 100%    Last Pain:  Vitals:   12/14/16 1320  TempSrc: Tympanic                 Keandria Berrocal S

## 2016-12-25 DIAGNOSIS — H47323 Drusen of optic disc, bilateral: Secondary | ICD-10-CM | POA: Diagnosis not present

## 2017-01-23 DIAGNOSIS — J300 Vasomotor rhinitis: Secondary | ICD-10-CM | POA: Diagnosis not present

## 2017-01-23 DIAGNOSIS — B373 Candidiasis of vulva and vagina: Secondary | ICD-10-CM | POA: Diagnosis not present

## 2017-01-23 DIAGNOSIS — J019 Acute sinusitis, unspecified: Secondary | ICD-10-CM | POA: Diagnosis not present

## 2017-01-23 DIAGNOSIS — M5432 Sciatica, left side: Secondary | ICD-10-CM | POA: Diagnosis not present

## 2017-01-23 DIAGNOSIS — Z79899 Other long term (current) drug therapy: Secondary | ICD-10-CM | POA: Diagnosis not present

## 2017-03-15 DIAGNOSIS — J029 Acute pharyngitis, unspecified: Secondary | ICD-10-CM | POA: Diagnosis not present

## 2017-04-20 DIAGNOSIS — D2272 Melanocytic nevi of left lower limb, including hip: Secondary | ICD-10-CM | POA: Diagnosis not present

## 2017-04-20 DIAGNOSIS — D2271 Melanocytic nevi of right lower limb, including hip: Secondary | ICD-10-CM | POA: Diagnosis not present

## 2017-04-20 DIAGNOSIS — D2262 Melanocytic nevi of left upper limb, including shoulder: Secondary | ICD-10-CM | POA: Diagnosis not present

## 2017-04-20 DIAGNOSIS — D485 Neoplasm of uncertain behavior of skin: Secondary | ICD-10-CM | POA: Diagnosis not present

## 2017-04-20 DIAGNOSIS — D225 Melanocytic nevi of trunk: Secondary | ICD-10-CM | POA: Diagnosis not present

## 2017-04-30 ENCOUNTER — Other Ambulatory Visit: Payer: Self-pay | Admitting: Nurse Practitioner

## 2017-04-30 DIAGNOSIS — J324 Chronic pansinusitis: Secondary | ICD-10-CM | POA: Diagnosis not present

## 2017-04-30 DIAGNOSIS — E782 Mixed hyperlipidemia: Secondary | ICD-10-CM | POA: Diagnosis not present

## 2017-04-30 DIAGNOSIS — I10 Essential (primary) hypertension: Secondary | ICD-10-CM | POA: Diagnosis not present

## 2017-04-30 DIAGNOSIS — E559 Vitamin D deficiency, unspecified: Secondary | ICD-10-CM | POA: Diagnosis not present

## 2017-04-30 DIAGNOSIS — J0101 Acute recurrent maxillary sinusitis: Secondary | ICD-10-CM | POA: Diagnosis not present

## 2017-04-30 DIAGNOSIS — Z0001 Encounter for general adult medical examination with abnormal findings: Secondary | ICD-10-CM | POA: Diagnosis not present

## 2017-05-01 ENCOUNTER — Other Ambulatory Visit: Payer: Self-pay | Admitting: Internal Medicine

## 2017-05-02 ENCOUNTER — Other Ambulatory Visit: Payer: Self-pay

## 2017-05-02 MED ORDER — BUPROPION HCL ER (XL) 150 MG PO TB24: ORAL_TABLET | ORAL | 3 refills | Status: DC

## 2017-05-04 ENCOUNTER — Ambulatory Visit: Payer: BLUE CROSS/BLUE SHIELD | Admitting: Nurse Practitioner

## 2017-05-04 ENCOUNTER — Encounter: Payer: Self-pay | Admitting: Nurse Practitioner

## 2017-05-04 VITALS — BP 142/95 | HR 74 | Resp 16 | Ht 67.0 in | Wt 176.0 lb

## 2017-05-04 DIAGNOSIS — I10 Essential (primary) hypertension: Secondary | ICD-10-CM | POA: Diagnosis not present

## 2017-05-04 DIAGNOSIS — F411 Generalized anxiety disorder: Secondary | ICD-10-CM | POA: Insufficient documentation

## 2017-05-04 DIAGNOSIS — R41840 Attention and concentration deficit: Secondary | ICD-10-CM | POA: Insufficient documentation

## 2017-05-04 DIAGNOSIS — F988 Other specified behavioral and emotional disorders with onset usually occurring in childhood and adolescence: Secondary | ICD-10-CM | POA: Insufficient documentation

## 2017-05-04 LAB — COMPREHENSIVE METABOLIC PANEL
ALBUMIN: 4.5 g/dL (ref 3.5–5.5)
ALT: 17 IU/L (ref 0–32)
AST: 22 IU/L (ref 0–40)
Albumin/Globulin Ratio: 2 (ref 1.2–2.2)
Alkaline Phosphatase: 114 IU/L (ref 39–117)
BUN / CREAT RATIO: 9 (ref 9–23)
BUN: 8 mg/dL (ref 6–24)
Bilirubin Total: 0.3 mg/dL (ref 0.0–1.2)
CO2: 26 mmol/L (ref 20–29)
CREATININE: 0.93 mg/dL (ref 0.57–1.00)
Calcium: 9.4 mg/dL (ref 8.7–10.2)
Chloride: 103 mmol/L (ref 96–106)
GFR calc Af Amer: 82 mL/min/{1.73_m2} (ref >59)
GFR calc non Af Amer: 71 mL/min/{1.73_m2} (ref >59)
GLOBULIN, TOTAL: 2.3 g/dL (ref 1.5–4.5)
GLUCOSE: 88 mg/dL (ref 65–99)
Potassium: 4.1 mmol/L (ref 3.5–5.2)
SODIUM: 144 mmol/L (ref 134–144)
Total Protein: 6.8 g/dL (ref 6.0–8.5)

## 2017-05-04 LAB — CBC
HEMATOCRIT: 40.2 % (ref 34.0–46.6)
HEMOGLOBIN: 14.7 g/dL (ref 11.1–15.9)
MCH: 32.4 pg (ref 26.6–33.0)
MCHC: 36.6 g/dL — AB (ref 31.5–35.7)
MCV: 89 fL (ref 79–97)
Platelets: 284 10*3/uL (ref 150–379)
RBC: 4.54 x10E6/uL (ref 3.77–5.28)
RDW: 12.2 % — AB (ref 12.3–15.4)
WBC: 6.1 10*3/uL (ref 3.4–10.8)

## 2017-05-04 LAB — T4, FREE: FREE T4: 1.33 ng/dL (ref 0.82–1.77)

## 2017-05-04 LAB — LIPID PANEL W/O CHOL/HDL RATIO
Cholesterol, Total: 162 mg/dL (ref 100–199)
HDL: 77 mg/dL (ref >39)
LDL CALC: 76 mg/dL (ref 0–99)
TRIGLYCERIDES: 47 mg/dL (ref 0–149)
VLDL Cholesterol Cal: 9 mg/dL (ref 5–40)

## 2017-05-04 LAB — TSH: TSH: 1.52 u[IU]/mL (ref 0.450–4.500)

## 2017-05-04 LAB — VITAMIN D 25 HYDROXY (VIT D DEFICIENCY, FRACTURES): Vit D, 25-Hydroxy: 31.2 ng/mL (ref 30.0–100.0)

## 2017-05-04 MED ORDER — ALPRAZOLAM 0.5 MG PO TABS: 0.5000 mg | ORAL_TABLET | Freq: Two times a day (BID) | ORAL | 3 refills | Status: DC | PRN

## 2017-05-04 MED ORDER — AMPHETAMINE-DEXTROAMPHETAMINE 15 MG PO TABS: 15.0000 mg | ORAL_TABLET | Freq: Two times a day (BID) | ORAL | 0 refills | Status: DC

## 2017-05-04 NOTE — Progress Notes (Signed)
[facility] [place], [place] [address]  Internal MEDICINE  Office Visit Note  Patient Name: Jillian Edwards  [id number]  [id number]  Date of Service: 05/04/2017  Chief Complaint  Patient presents with  . Sinusitis    Sinusitis  This is a recurrent problem. The current episode started more than 1 month ago. The problem has been waxing and waning since onset. There has been no fever. Associated symptoms include congestion, coughing, headaches, sinus pressure, sneezing and a sore throat. Treatments tried: currently on antibiotics and oral steroids . The treatment provided mild relief.    Pt is here for routine follow up.    Current Medication: Outpatient Encounter Medications as of 05/04/2017  Medication Sig Note  . ALPRAZolam (XANAX) 0.5 MG tablet Take 1 tablet (0.5 mg total) by mouth 2 (two) times daily as needed for anxiety.   Marland Kitchen amphetamine-dextroamphetamine (ADDERALL) 15 MG tablet Take 1 tablet by mouth 2 (two) times daily.   Marland Kitchen aspirin EC 81 MG tablet Take 81 mg by mouth daily.   Marland Kitchen buPROPion (WELLBUTRIN XL) 150 MG 24 hr tablet Take 2 tablets by mouth daily   . calcium carbonate (TUMS - DOSED IN MG ELEMENTAL CALCIUM) 500 MG chewable tablet Chew 1 tablet by mouth daily.   . cholecalciferol (VITAMIN D) 1000 units tablet Take 1,000 Units by mouth daily.   . hydrochlorothiazide (HYDRODIURIL) 25 MG tablet TAKE 1 TABLET BY MOUTH EVERY DAY   . levonorgestrel (MIRENA) 20 MCG/24HR IUD 1 each by Intrauterine route once.   . meloxicam (MOBIC) 7.5 MG tablet Take 1 tablet (7.5 mg total) by mouth daily.   . [DISCONTINUED] ALPRAZolam (XANAX) 0.5 MG tablet TAKE 1/2-1 TABLET BY MOUTH TWICE DAILY AS NEEDED FOR ANXIETY 07/29/2015: PRN  . [DISCONTINUED] amphetamine-dextroamphetamine (ADDERALL) 15 MG tablet TAKE 1 TABLET BY MOUTH TWICE A DAY AS NEEDED FOCUS **DNF 09/09/16**   . [DISCONTINUED] amphetamine-dextroamphetamine (ADDERALL) 15 MG tablet Take 1 tablet by mouth 2 (two) times  daily.   . [DISCONTINUED] amphetamine-dextroamphetamine (ADDERALL) 15 MG tablet Take 1 tablet by mouth 2 (two) times daily.   . busPIRone (BUSPAR) 7.5 MG tablet  07/29/2015: Received from: External Pharmacy  . [DISCONTINUED] terconazole (TERAZOL 3) 80 MG vaginal suppository Place 1 suppository (80 mg total) vaginally at bedtime.    No facility-administered encounter medications on file as of 05/04/2017.     Surgical History: Past Surgical History:  Procedure Laterality Date  . BREAST BIOPSY Left 02/06/2013   stereotactic biopsy of calcifications-benign  . BREAST ENHANCEMENT SURGERY  08/2003  . COLONOSCOPY WITH PROPOFOL N/A 12/14/2016   Procedure: COLONOSCOPY WITH PROPOFOL;  Surgeon: Jonathon Bellows, MD;  Location: [facility] ENDOSCOPY;  Service: Gastroenterology;  Laterality: N/A;  . COLPOSCOPY  03/1998; 07/1999   2000-no lesion; 2001-CIN1  . ENDOMETRIAL BIOPSY  03/2007   proliferative    Medical History: Past Medical History:  Diagnosis Date  . ADD (attention deficit disorder)   . Anxiety   . Depression   . Frequent urination   . Herpes genitalis   . Hypertension   . Stress headaches   . Stroke ([place]) ~2005    loss of peripheral vision  . Stroke or transient ischemic attack (TIA) diagnosed during current admission   . Urological disorder    bladder has dropped    Family History: Family History  Problem Relation Age of Onset  . Hypertension Father   . Diabetes Father        Has also had an amputation  .  Heart attack Father   . Diabetes Mellitus II Brother   . Hypertension Brother   . Breast cancer Maternal Grandmother   . Diabetes Mellitus II Paternal Grandmother     Social History   Socioeconomic History  . Marital status: Legally Separated    Spouse name: Not on file  . Number of children: 1  . Years of education: 47  . Highest education level: Not on file  Social Needs  . Financial resource strain: Not on file  . Food insecurity - worry: Not on file  . Food insecurity  - inability: Not on file  . Transportation needs - medical: Not on file  . Transportation needs - non-medical: Not on file  Occupational History  . Occupation: Corporate treasurer  Tobacco Use  . Smoking status: Never Smoker  . Smokeless tobacco: Never Used  Substance and Sexual Activity  . Alcohol use: No    Alcohol/week: 0.0 oz  . Drug use: No  . Sexual activity: Yes    Birth control/protection: IUD  Other Topics Concern  . Not on file  Social History Narrative  . Not on file      Review of Systems  Constitutional: Positive for activity change and fatigue. Negative for fever.  HENT: Positive for congestion, rhinorrhea, sinus pressure, sinus pain, sneezing and sore throat.   Eyes: Negative.   Respiratory: Positive for cough. Negative for wheezing.   Gastrointestinal: Positive for nausea. Negative for vomiting.  Endocrine: Negative for cold intolerance, heat intolerance, polydipsia, polyphagia and polyuria.  Musculoskeletal: Negative for arthralgias and myalgias.  Skin: Negative for color change, pallor, rash and wound.  Allergic/Immunologic: Positive for environmental allergies.  Neurological: Positive for headaches.  Hematological: Negative for adenopathy. Does not bruise/bleed easily.  Psychiatric/Behavioral: Positive for decreased concentration and sleep disturbance. The patient is nervous/anxious.    Today's Vitals   05/04/17 0836  BP: (!) 142/95  Pulse: 74  Resp: 16  SpO2: 98%  Weight: 176 lb (79.8 kg)  Height: 5\' 7"  (1.702 m)    Physical Exam  Constitutional: She is oriented to person, place, and time. She appears well-developed and well-nourished. No distress.  HENT:  Head: Normocephalic and atraumatic.  Mouth/Throat: Oropharynx is clear and moist. No oropharyngeal exudate.  Eyes: EOM are normal. Pupils are equal, round, and reactive to light.  Neck: Normal range of motion. Neck supple. No JVD present. No tracheal deviation present. No thyromegaly  present.  Cardiovascular: Normal rate, regular rhythm and normal heart sounds. Exam reveals no gallop and no friction rub.  No murmur heard. Pulmonary/Chest: Effort normal. No respiratory distress. She has no wheezes. She has no rales. She exhibits no tenderness.  Abdominal: Soft. Bowel sounds are normal.  Musculoskeletal: Normal range of motion.  Lymphadenopathy:    She has no cervical adenopathy.  Neurological: She is alert and oriented to person, place, and time. No cranial nerve deficit.  Skin: Skin is warm and dry. She is not diaphoretic.  Psychiatric: She has a normal mood and affect. Her behavior is normal. Judgment and thought content normal.  Nursing note and vitals reviewed.  Assessment/Plan: 1. Essential hypertension Stable. Continue bp medication as prescribed   2. Attention and concentration deficit - amphetamine-dextroamphetamine (ADDERALL) 15 MG tablet; Take 1 tablet by mouth 2 (two) times daily.  Dispense: 60 tablet; Refill: 0 Three 30 day prescriptions were sent to pharmacy. Dates are 05/04/2017, 06/01/2017, and 05/30/2017.   3. GAD (generalized anxiety disorder) - ALPRAZolam (XANAX) 0.5 MG tablet; Take 1 tablet (  0.5 mg total) by mouth 2 (two) times daily as needed for anxiety.  Dispense: 60 tablet; Refill: 3 Continue wellbutrin as prescribed.   General Counseling: Jalyne verbalizes understanding of the findings of todays visit and agrees with plan of treatment. I have discussed any further diagnostic evaluation that may be needed or ordered today. We also reviewed her medications today. she has been encouraged to call the office with any questions or concerns that should arise related to todays visit.  This patient was seen by Leretha Pol, FNP- C in Collaboration with Dr Lavera Guise as a part of collaborative care agreement  Meds ordered this encounter  Medications  . DISCONTD: amphetamine-dextroamphetamine (ADDERALL) 15 MG tablet    Sig: Take 1 tablet by mouth 2  (two) times daily.    Dispense:  60 tablet    Refill:  0    Order Specific Question:   Supervising Provider    Answer:   Lavera Guise [[id number]]  . DISCONTD: amphetamine-dextroamphetamine (ADDERALL) 15 MG tablet    Sig: Take 1 tablet by mouth 2 (two) times daily.    Dispense:  60 tablet    Refill:  0    Fill after 06/01/2017    Order Specific Question:   Supervising Provider    Answer:   Lavera Guise [[id number]]  . amphetamine-dextroamphetamine (ADDERALL) 15 MG tablet    Sig: Take 1 tablet by mouth 2 (two) times daily.    Dispense:  60 tablet    Refill:  0    Fill after 06/30/2017    Order Specific Question:   Supervising Provider    Answer:   Lavera Guise [[id number]]  . ALPRAZolam (XANAX) 0.5 MG tablet    Sig: Take 1 tablet (0.5 mg total) by mouth 2 (two) times daily as needed for anxiety.    Dispense:  60 tablet    Refill:  3    Order Specific Question:   Supervising Provider    Answer:   Lavera Guise [[id number]]    Time spent:  4 Minutes   Dr Lavera Guise Internal medicine

## 2017-05-24 ENCOUNTER — Telehealth: Payer: Self-pay

## 2017-05-24 NOTE — Telephone Encounter (Signed)
-----   Message from Ronnell Freshwater, NP sent at 05/24/2017  7:58 AM EST ----- She will have to use her low dose alprazolam as substitute for now. May consider taking half doses if this makes her sleepy. Only until buspirone is off back order. Really not another medication like that. If needed, I can send in new rx for her alprazolam. Thanks  ----- Message ----- From: Corlis Hove Sent: 05/23/2017   3:56 PM To: Ronnell Freshwater, NP  All dose are bacorder ----- Message ----- From: Ronnell Freshwater, NP Sent: 05/23/2017  10:08 AM To: Corlis Hove  Can you find out if it is just this dose that is backordered, or if it is all dosing? Then I can make a better decision. Thanks  ----- Message ----- From: Corlis Hove Sent: 05/23/2017   9:44 AM To: Ronnell Freshwater, NP  Pt called that buspirone 7,5 is backorder can you send something else

## 2017-05-24 NOTE — Telephone Encounter (Signed)
Pt advised we send alternate for buspirone

## 2017-06-13 DIAGNOSIS — Z1231 Encounter for screening mammogram for malignant neoplasm of breast: Secondary | ICD-10-CM | POA: Diagnosis not present

## 2017-07-23 ENCOUNTER — Telehealth: Payer: Self-pay

## 2017-07-23 ENCOUNTER — Ambulatory Visit: Payer: BLUE CROSS/BLUE SHIELD | Admitting: Nurse Practitioner

## 2017-07-23 ENCOUNTER — Encounter: Payer: Self-pay | Admitting: Nurse Practitioner

## 2017-07-23 VITALS — BP 127/90 | HR 82 | Temp 94.2°F | Resp 16 | Ht 67.0 in | Wt 161.2 lb

## 2017-07-23 DIAGNOSIS — K529 Noninfective gastroenteritis and colitis, unspecified: Secondary | ICD-10-CM

## 2017-07-23 DIAGNOSIS — A09 Infectious gastroenteritis and colitis, unspecified: Secondary | ICD-10-CM | POA: Diagnosis not present

## 2017-07-23 MED ORDER — CIPROFLOXACIN HCL 500 MG PO TABS: 500.0000 mg | ORAL_TABLET | Freq: Two times a day (BID) | ORAL | 0 refills | Status: DC

## 2017-07-23 MED ORDER — DIPHENOXYLATE-ATROPINE 2.5-0.025 MG PO TABS: ORAL_TABLET | ORAL | 0 refills | Status: DC

## 2017-07-23 NOTE — Telephone Encounter (Signed)
Patient did not stop by check out to schedule any future appts.titania

## 2017-07-23 NOTE — Progress Notes (Signed)
[facility] [place], [place] [address]  Internal MEDICINE  Office Visit Note  Patient Name: Jillian Edwards  [id number]  [id number]  Date of Service: 07/23/2017  Chief Complaint  Patient presents with  . Diarrhea    constant since last friday. keeping her up at night, every thing she eats or drinks runs straight out. Rear end is raw, did run a low grade fever.      Diarrhea   This is a new problem. The current episode started in the past 7 days. The problem occurs 5 to 10 times per day. The problem has been unchanged. The stool consistency is described as watery. The patient states that diarrhea awakens her from sleep. Associated symptoms include abdominal pain, bloating, chills, headaches, myalgias and sweats. Pertinent negatives include no arthralgias, coughing or vomiting. Exacerbated by: eating. moving.  There are no known risk factors. She has tried bismuth subsalicylate for the symptoms. The treatment provided no relief.   Pt is here for a sick visit.     Current Medication:  Outpatient Encounter Medications as of 07/23/2017  Medication Sig Note  . ALPRAZolam (XANAX) 0.5 MG tablet Take 1 tablet (0.5 mg total) by mouth 2 (two) times daily as needed for anxiety.   Marland Kitchen amphetamine-dextroamphetamine (ADDERALL) 15 MG tablet Take 1 tablet by mouth 2 (two) times daily.   Marland Kitchen aspirin EC 81 MG tablet Take 81 mg by mouth daily.   Marland Kitchen buPROPion (WELLBUTRIN XL) 150 MG 24 hr tablet Take 2 tablets by mouth daily   . busPIRone (BUSPAR) 7.5 MG tablet  07/29/2015: Received from: External Pharmacy  . calcium carbonate (TUMS - DOSED IN MG ELEMENTAL CALCIUM) 500 MG chewable tablet Chew 1 tablet by mouth daily.   . cholecalciferol (VITAMIN D) 1000 units tablet Take 1,000 Units by mouth daily.   . hydrochlorothiazide (HYDRODIURIL) 25 MG tablet TAKE 1 TABLET BY MOUTH EVERY DAY   . levonorgestrel (MIRENA) 20 MCG/24HR IUD 1 each by Intrauterine route once.   . meloxicam (MOBIC)  7.5 MG tablet Take 1 tablet (7.5 mg total) by mouth daily.   . ciprofloxacin (CIPRO) 500 MG tablet Take 1 tablet (500 mg total) by mouth 2 (two) times daily.   . diphenoxylate-atropine (LOMOTIL) 2.5-0.025 MG tablet Take 1 to 2 tablets po TID prn diarrhea. Not to exceed 8 tablets in 24 hours    No facility-administered encounter medications on file as of 07/23/2017.       Medical History: Past Medical History:  Diagnosis Date  . ADD (attention deficit disorder)   . Anxiety   . Depression   . Frequent urination   . Herpes genitalis   . Hypertension   . Stress headaches   . Stroke ([place]) ~2005    loss of peripheral vision  . Stroke or transient ischemic attack (TIA) diagnosed during current admission   . Urological disorder    bladder has dropped     Vital Signs: BP 127/90 (BP Location: Right Arm, Patient Position: Sitting, Cuff Size: Normal)   Pulse 82   Temp (!) 94.2 F (34.6 C)   Resp 16   Ht 5\' 7"  (1.702 m)   Wt 161 lb 3.2 oz (73.1 kg)   SpO2 100%   BMI 25.25 kg/m    Review of Systems  Constitutional: Positive for appetite change, chills and fatigue. Negative for unexpected weight change.  HENT: Negative for congestion, postnasal drip, rhinorrhea, sneezing, sore throat and voice change.   Eyes: Negative.  Negative for redness. Eye pain: oti.  Respiratory: Negative for cough, chest tightness and shortness of breath.   Cardiovascular: Negative for chest pain and palpitations.  Gastrointestinal: Positive for abdominal pain, bloating, diarrhea, nausea and rectal pain. Negative for constipation and vomiting.  Endocrine: Negative for cold intolerance, heat intolerance, polydipsia, polyphagia and polyuria.  Genitourinary: Negative for dysuria and frequency.  Musculoskeletal: Positive for back pain and myalgias. Negative for arthralgias, joint swelling and neck pain.  Skin: Negative for rash.  Neurological: Positive for headaches. Negative for tremors, weakness and numbness.   Hematological: Negative for adenopathy. Does not bruise/bleed easily.  Psychiatric/Behavioral: Negative for behavioral problems (Depression), sleep disturbance and suicidal ideas. The patient is not nervous/anxious.     Physical Exam  Constitutional: She is oriented to person, place, and time. She appears well-developed and well-nourished. She appears distressed.  HENT:  Head: Normocephalic and atraumatic.  Mouth/Throat: Oropharynx is clear and moist. No oropharyngeal exudate.  Eyes: Pupils are equal, round, and reactive to light. Conjunctivae and EOM are normal.  Neck: Normal range of motion. Neck supple. No JVD present. No tracheal deviation present. No thyromegaly present.  Cardiovascular: Normal rate, regular rhythm and normal heart sounds. Exam reveals no gallop and no friction rub.  No murmur heard. Pulmonary/Chest: Effort normal and breath sounds normal. No respiratory distress. She has no wheezes. She has no rales. She exhibits no tenderness.  Abdominal: Soft. Bowel sounds are increased. There is generalized tenderness. There is guarding. No hernia.  Musculoskeletal: Normal range of motion.  Lymphadenopathy:    She has no cervical adenopathy.  Neurological: She is alert and oriented to person, place, and time. No cranial nerve deficit.  Skin: Skin is warm and dry. She is not diaphoretic.  Psychiatric: She has a normal mood and affect. Her behavior is normal. Judgment and thought content normal.  Nursing note and vitals reviewed.  Assessment/Plan: 1. Gastroenteritis Assume infections origin. Start ciprofloxacin 500mg  bid for 10 days The 'BRAT' diet is suggested, then progress to diet as tolerated as symptoms abate. Call if bloody stools, persistent diarrhea, vomiting, fever or abdominal pain./ - ciprofloxacin (CIPRO) 500 MG tablet; Take 1 tablet (500 mg total) by mouth 2 (two) times daily.  Dispense: 20 tablet; Refill: 0  2. Diarrhea of infectious origin - diphenoxylate-atropine  (LOMOTIL) 2.5-0.025 MG tablet; Take 1 to 2 tablets po TID prn diarrhea. Not to exceed 8 tablets in 24 hours  Dispense: 45 tablet; Refill: 0 - advised patient not to take more than 8 tablets in 24 hour time period.  - Cdiff NAA+O+P+Stool Culture  General Counseling: Ruther verbalizes understanding of the findings of todays visit and agrees with plan of treatment. I have discussed any further diagnostic evaluation that may be needed or ordered today. We also reviewed her medications today. she has been encouraged to call the office with any questions or concerns that should arise related to todays visit.   Rest and increase fluids. Continue using OTC medication to control symptoms.    Orders Placed This Encounter  Procedures  . Cdiff NAA+O+P+Stool Culture    Meds ordered this encounter  Medications  . diphenoxylate-atropine (LOMOTIL) 2.5-0.025 MG tablet    Sig: Take 1 to 2 tablets po TID prn diarrhea. Not to exceed 8 tablets in 24 hours    Dispense:  45 tablet    Refill:  0    Order Specific Question:   Supervising Provider    Answer:   Lavera Guise [place]  . ciprofloxacin (CIPRO)  500 MG tablet    Sig: Take 1 tablet (500 mg total) by mouth 2 (two) times daily.    Dispense:  20 tablet    Refill:  0    Order Specific Question:   Supervising Provider    Answer:   Lavera Guise [[id number]]    Time spent: 15 Minutes

## 2017-07-27 ENCOUNTER — Other Ambulatory Visit: Payer: Self-pay

## 2017-07-27 MED ORDER — BUPROPION HCL ER (XL) 150 MG PO TB24: ORAL_TABLET | ORAL | 3 refills | Status: DC

## 2017-07-30 ENCOUNTER — Other Ambulatory Visit: Payer: Self-pay | Admitting: Nurse Practitioner

## 2017-07-30 MED ORDER — BUPROPION HCL ER (XL) 150 MG PO TB24: ORAL_TABLET | ORAL | 3 refills | Status: DC

## 2017-08-02 ENCOUNTER — Encounter: Payer: Self-pay | Admitting: Nurse Practitioner

## 2017-08-02 ENCOUNTER — Ambulatory Visit (INDEPENDENT_AMBULATORY_CARE_PROVIDER_SITE_OTHER): Payer: BLUE CROSS/BLUE SHIELD | Admitting: Nurse Practitioner

## 2017-08-02 VITALS — BP 137/87 | HR 75 | Resp 16 | Ht 67.0 in | Wt 162.0 lb

## 2017-08-02 DIAGNOSIS — R41840 Attention and concentration deficit: Secondary | ICD-10-CM

## 2017-08-02 DIAGNOSIS — I10 Essential (primary) hypertension: Secondary | ICD-10-CM

## 2017-08-02 DIAGNOSIS — J0141 Acute recurrent pansinusitis: Secondary | ICD-10-CM | POA: Diagnosis not present

## 2017-08-02 DIAGNOSIS — R600 Localized edema: Secondary | ICD-10-CM

## 2017-08-02 MED ORDER — AMPHETAMINE-DEXTROAMPHETAMINE 20 MG PO TABS: 20.0000 mg | ORAL_TABLET | Freq: Two times a day (BID) | ORAL | 0 refills | Status: DC

## 2017-08-02 MED ORDER — AMOXICILLIN 875 MG PO TABS: 875.0000 mg | ORAL_TABLET | Freq: Two times a day (BID) | ORAL | 0 refills | Status: DC

## 2017-08-02 MED ORDER — HYDROCHLOROTHIAZIDE 25 MG PO TABS: 25.0000 mg | ORAL_TABLET | Freq: Every day | ORAL | 3 refills | Status: DC

## 2017-08-02 NOTE — Progress Notes (Signed)
[facility] [place], [place] [address]  Internal MEDICINE  Office Visit Note  Patient Name: Jillian Edwards  [id number]  [id number]  Date of Service: 08/02/2017    Pt is here for routine follow up.   Chief Complaint  Patient presents with  . Sinusitis  . Hypertension    The patient is concerned about swelling in both ankles. Has been happening more often, even when it's not hot and humid. curently taking HCTZ 25mg  every day.  She has also noted a difficult time with concentration, especially in the afternoons. Work is piling up and she just looks at it and just doesn't know how to even start. Had to get motivated to do the work that is piling up. Getting very sleeping in the afternoons as well.   Sinusitis  This is a recurrent problem. The current episode started more than 1 month ago. The problem has been waxing and waning since onset. There has been no fever. Associated symptoms include congestion, coughing, headaches, sinus pressure, sneezing and a sore throat. Past treatments include antibiotics (currently on antibiotics and oral steroids .). The treatment provided moderate relief.      Current Medication: Outpatient Encounter Medications as of 08/02/2017  Medication Sig Note  . ALPRAZolam (XANAX) 0.5 MG tablet Take 1 tablet (0.5 mg total) by mouth 2 (two) times daily as needed for anxiety.   Marland Kitchen amoxicillin (AMOXIL) 875 MG tablet Take 1 tablet (875 mg total) by mouth 2 (two) times daily.   Marland Kitchen amphetamine-dextroamphetamine (ADDERALL) 20 MG tablet Take 1 tablet (20 mg total) by mouth 2 (two) times daily.   Marland Kitchen aspirin EC 81 MG tablet Take 81 mg by mouth daily.   Marland Kitchen buPROPion (WELLBUTRIN XL) 150 MG 24 hr tablet Take 2 tablets by mouth daily   . calcium carbonate (TUMS - DOSED IN MG ELEMENTAL CALCIUM) 500 MG chewable tablet Chew 1 tablet by mouth daily.   . cholecalciferol (VITAMIN D) 1000 units tablet Take 1,000 Units by mouth daily.   . ciprofloxacin (CIPRO)  500 MG tablet Take 1 tablet (500 mg total) by mouth 2 (two) times daily. (Patient not taking: Reported on 08/02/2017)   . diphenoxylate-atropine (LOMOTIL) 2.5-0.025 MG tablet Take 1 to 2 tablets po TID prn diarrhea. Not to exceed 8 tablets in 24 hours   . hydrochlorothiazide (HYDRODIURIL) 25 MG tablet Take 1 tablet (25 mg total) by mouth daily. Take 1 tablet QAM and 1/2 tablet QPM as needed   . levonorgestrel (MIRENA) 20 MCG/24HR IUD 1 each by Intrauterine route once.   . meloxicam (MOBIC) 7.5 MG tablet Take 1 tablet (7.5 mg total) by mouth daily.   . [DISCONTINUED] amphetamine-dextroamphetamine (ADDERALL) 15 MG tablet Take 1 tablet by mouth 2 (two) times daily.   . [DISCONTINUED] amphetamine-dextroamphetamine (ADDERALL) 20 MG tablet Take 1 tablet (20 mg total) by mouth 2 (two) times daily.   . [DISCONTINUED] amphetamine-dextroamphetamine (ADDERALL) 20 MG tablet Take 1 tablet (20 mg total) by mouth 2 (two) times daily.   . [DISCONTINUED] busPIRone (BUSPAR) 7.5 MG tablet  07/29/2015: Received from: External Pharmacy  . [DISCONTINUED] hydrochlorothiazide (HYDRODIURIL) 25 MG tablet TAKE 1 TABLET BY MOUTH EVERY DAY    No facility-administered encounter medications on file as of 08/02/2017.     Surgical History: Past Surgical History:  Procedure Laterality Date  . BREAST BIOPSY Left 02/06/2013   stereotactic biopsy of calcifications-benign  . BREAST ENHANCEMENT SURGERY  08/2003  . COLONOSCOPY WITH PROPOFOL N/A 12/14/2016  Procedure: COLONOSCOPY WITH PROPOFOL;  Surgeon: Jonathon Bellows, MD;  Location: [facility] ENDOSCOPY;  Service: Gastroenterology;  Laterality: N/A;  . COLPOSCOPY  03/1998; 07/1999   2000-no lesion; 2001-CIN1  . ENDOMETRIAL BIOPSY  03/2007   proliferative    Medical History: Past Medical History:  Diagnosis Date  . ADD (attention deficit disorder)   . Anxiety   . Depression   . Frequent urination   . Herpes genitalis   . Hypertension   . Stress headaches   . Stroke ([place]) ~2005     loss of peripheral vision  . Stroke or transient ischemic attack (TIA) diagnosed during current admission   . Urological disorder    bladder has dropped    Family History: Family History  Problem Relation Age of Onset  . Hypertension Father   . Diabetes Father        Has also had an amputation  . Heart attack Father   . Diabetes Mellitus II Brother   . Hypertension Brother   . Breast cancer Maternal Grandmother   . Diabetes Mellitus II Paternal Grandmother     Social History   Socioeconomic History  . Marital status: Legally Separated    Spouse name: Not on file  . Number of children: 1  . Years of education: 47  . Highest education level: Not on file  Occupational History  . Occupation: Corporate treasurer  Social Needs  . Financial resource strain: Not on file  . Food insecurity:    Worry: Not on file    Inability: Not on file  . Transportation needs:    Medical: Not on file    Non-medical: Not on file  Tobacco Use  . Smoking status: Never Smoker  . Smokeless tobacco: Never Used  Substance and Sexual Activity  . Alcohol use: No    Alcohol/week: 0.0 oz  . Drug use: No  . Sexual activity: Yes    Birth control/protection: IUD  Lifestyle  . Physical activity:    Days per week: Not on file    Minutes per session: Not on file  . Stress: Not on file  Relationships  . Social connections:    Talks on phone: Not on file    Gets together: Not on file    Attends religious service: Not on file    Active member of club or organization: Not on file    Attends meetings of clubs or organizations: Not on file    Relationship status: Not on file  . Intimate partner violence:    Fear of current or ex partner: Not on file    Emotionally abused: Not on file    Physically abused: Not on file    Forced sexual activity: Not on file  Other Topics Concern  . Not on file  Social History Narrative  . Not on file      Review of Systems  Constitutional: Positive for  activity change and fatigue. Negative for fever.  HENT: Positive for congestion, rhinorrhea, sinus pressure, sinus pain, sneezing and sore throat.   Eyes: Negative.   Respiratory: Positive for cough. Negative for wheezing.   Cardiovascular: Positive for leg swelling. Negative for chest pain and palpitations.  Gastrointestinal: Positive for nausea. Negative for vomiting.  Endocrine: Negative for cold intolerance, heat intolerance, polydipsia, polyphagia and polyuria.  Musculoskeletal: Negative for arthralgias, back pain and myalgias.  Skin: Negative for color change, pallor, rash and wound.  Allergic/Immunologic: Positive for environmental allergies.  Neurological: Positive for headaches.  Hematological: Negative for  adenopathy. Does not bruise/bleed easily.  Psychiatric/Behavioral: Positive for decreased concentration and sleep disturbance. The patient is nervous/anxious.     Today's Vitals   08/02/17 0842  BP: 137/87  Pulse: 75  Resp: 16  SpO2: 100%  Weight: 162 lb (73.5 kg)  Height: 5\' 7"  (1.702 m)    Physical Exam  Constitutional: She is oriented to person, place, and time. She appears well-developed and well-nourished. No distress.  HENT:  Head: Normocephalic and atraumatic.  Mouth/Throat: Oropharynx is clear and moist. No oropharyngeal exudate.  Eyes: Pupils are equal, round, and reactive to light. EOM are normal.  Neck: Normal range of motion. Neck supple. No JVD present. No tracheal deviation present. No thyromegaly present.  Cardiovascular: Normal rate, regular rhythm and normal heart sounds. Exam reveals no gallop and no friction rub.  No murmur heard. Pulmonary/Chest: Effort normal. No respiratory distress. She has no wheezes. She has no rales. She exhibits no tenderness.  Abdominal: Soft. Bowel sounds are normal.  Musculoskeletal: Normal range of motion.  Lymphadenopathy:    She has no cervical adenopathy.  Neurological: She is alert and oriented to person, place,  and time. No cranial nerve deficit.  Skin: Skin is warm and dry. She is not diaphoretic.  Psychiatric: She has a normal mood and affect. Her behavior is normal. Judgment and thought content normal.  Nursing note and vitals reviewed.  Assessment/Plan: 1. Acute recurrent pansinusitis Start amoxicillin 875mg  twice daily for 10 days. Recommend OTC medication to alleviate symptoms.  - amoxicillin (AMOXIL) 875 MG tablet; Take 1 tablet (875 mg total) by mouth 2 (two) times daily.  Dispense: 20 tablet; Refill: 0  2. Attention and concentration deficit Increase adderall to 20mg  twice daily as needed. Three 30 day prescriptions sent to her pharmacy. Dates are 08/02/2017, 08/31/2017, and 09/28/2017. - amphetamine-dextroamphetamine (ADDERALL) 20 MG tablet; Take 1 tablet (20 mg total) by mouth 2 (two) times daily.  Dispense: 60 tablet; Refill: 0  3. Essential hypertension Add HCTZ 12.5mg  in afternoon. Continue HCTZ 25mg  daily.  - hydrochlorothiazide (HYDRODIURIL) 25 MG tablet; Take 1 tablet (25 mg total) by mouth daily. Take 1 tablet QAM and 1/2 tablet QPM as needed  Dispense: 45 tablet; Refill: 3  4. Edema of both feet Add HCTZ 12.5mg  tablet in afternoon. Recommend decreasing salt intake and increasing water intake. Recommend frequent breaks to get up and walk around during her day at work.    General Counseling: Korrine verbalizes understanding of the findings of todays visit and agrees with plan of treatment. I have discussed any further diagnostic evaluation that may be needed or ordered today. We also reviewed her medications today. she has been encouraged to call the office with any questions or concerns that should arise related to todays visit.   Hypertension Counseling:   The following hypertensive lifestyle modification were recommended and discussed:  1. Limiting alcohol intake to less than 1 oz/day of ethanol:(24 oz of beer or 8 oz of wine or 2 oz of 100-proof whiskey). 2. Take baby ASA 81 mg  daily. 3. Importance of regular aerobic exercise and losing weight. 4. Reduce dietary saturated fat and cholesterol intake for overall cardiovascular health. 5. Maintaining adequate dietary potassium, calcium, and magnesium intake. 6. Regular monitoring of the blood pressure. 7. Reduce sodium intake to less than 100 mmol/day (less than 2.3 gm of sodium or less than 6 gm of sodium choride)   This patient was seen by Leretha Pol, FNP- C in Collaboration with Dr Lavera Guise  as a part of collaborative care agreement   Meds ordered this encounter  Medications  . amoxicillin (AMOXIL) 875 MG tablet    Sig: Take 1 tablet (875 mg total) by mouth 2 (two) times daily.    Dispense:  20 tablet    Refill:  0    Order Specific Question:   Supervising Provider    Answer:   Lavera Guise [[id number]]  . DISCONTD: amphetamine-dextroamphetamine (ADDERALL) 20 MG tablet    Sig: Take 1 tablet (20 mg total) by mouth 2 (two) times daily.    Dispense:  60 tablet    Refill:  0    Order Specific Question:   Supervising Provider    Answer:   Lavera Guise [[id number]]  . hydrochlorothiazide (HYDRODIURIL) 25 MG tablet    Sig: Take 1 tablet (25 mg total) by mouth daily. Take 1 tablet QAM and 1/2 tablet QPM as needed    Dispense:  45 tablet    Refill:  3    Order Specific Question:   Supervising Provider    Answer:   Lavera Guise [place]  . DISCONTD: amphetamine-dextroamphetamine (ADDERALL) 20 MG tablet    Sig: Take 1 tablet (20 mg total) by mouth 2 (two) times daily.    Dispense:  60 tablet    Refill:  0    Fill after 08/31/2017    Order Specific Question:   Supervising Provider    Answer:   Lavera Guise [[id number]]  . amphetamine-dextroamphetamine (ADDERALL) 20 MG tablet    Sig: Take 1 tablet (20 mg total) by mouth 2 (two) times daily.    Dispense:  60 tablet    Refill:  0    Fill after 09/28/2017    Order Specific Question:   Supervising Provider    Answer:   Lavera Guise [[id number]]    Time spent: 63  Minutes     Dr Lavera Guise Internal medicine

## 2017-08-28 ENCOUNTER — Other Ambulatory Visit: Payer: Self-pay

## 2017-08-28 MED ORDER — BUPROPION HCL ER (XL) 150 MG PO TB24: ORAL_TABLET | ORAL | 0 refills | Status: DC

## 2017-09-18 ENCOUNTER — Other Ambulatory Visit: Payer: Self-pay | Admitting: Nurse Practitioner

## 2017-09-18 ENCOUNTER — Telehealth: Payer: Self-pay

## 2017-09-18 DIAGNOSIS — L03039 Cellulitis of unspecified toe: Secondary | ICD-10-CM

## 2017-09-18 MED ORDER — MUPIROCIN 2 % EX OINT: TOPICAL_OINTMENT | CUTANEOUS | 1 refills | Status: DC

## 2017-09-18 NOTE — Progress Notes (Signed)
Sent mupirocin ointment which can be used twice daily for next 10 days to her pharmacy, recommend epsom salt soaks for 20 minutes once or twice daily.

## 2017-09-18 NOTE — Telephone Encounter (Signed)
Pt was called and notified

## 2017-09-18 NOTE — Telephone Encounter (Signed)
Sent mupirocin ointment which can be used twice daily for next 10 days to her pharmacy, recommend epsom salt soaks for 20 minutes once or twice dail

## 2017-11-02 ENCOUNTER — Encounter: Payer: Self-pay | Admitting: Nurse Practitioner

## 2017-11-02 ENCOUNTER — Ambulatory Visit (INDEPENDENT_AMBULATORY_CARE_PROVIDER_SITE_OTHER): Payer: BLUE CROSS/BLUE SHIELD | Admitting: Nurse Practitioner

## 2017-11-02 VITALS — BP 130/80 | HR 72 | Resp 16 | Ht 67.0 in | Wt 163.0 lb

## 2017-11-02 DIAGNOSIS — R600 Localized edema: Secondary | ICD-10-CM

## 2017-11-02 DIAGNOSIS — R41840 Attention and concentration deficit: Secondary | ICD-10-CM | POA: Diagnosis not present

## 2017-11-02 DIAGNOSIS — Z79899 Other long term (current) drug therapy: Secondary | ICD-10-CM | POA: Diagnosis not present

## 2017-11-02 DIAGNOSIS — I10 Essential (primary) hypertension: Secondary | ICD-10-CM

## 2017-11-02 LAB — POCT URINE DRUG SCREEN
METHYLENEDIOXYAMPHETAMINE: NEGATIVE
POC AMPHETAMINE UR: POSITIVE — AB
POC BARBITURATE UR: NOT DETECTED
POC BENZODIAZEPINES UR: NOT DETECTED
POC Cocaine UR: NOT DETECTED
POC Ecstasy UR: NOT DETECTED
POC MARIJUANA UR: NOT DETECTED
POC METHADONE UR: NOT DETECTED
POC METHAMPHETAMINE UR: NOT DETECTED
POC OPIATE UR: NOT DETECTED
POC OXYCODONE UR: NOT DETECTED
POC PHENCYCLIDINE UR: NOT DETECTED
POC TRICYCLICS UR: NOT DETECTED

## 2017-11-02 MED ORDER — AMPHETAMINE-DEXTROAMPHETAMINE 20 MG PO TABS: 20.0000 mg | ORAL_TABLET | Freq: Two times a day (BID) | ORAL | 0 refills | Status: DC

## 2017-11-02 NOTE — Progress Notes (Signed)
[facility] [place], [place] [address]  Internal MEDICINE  Office Visit Note  Patient Name: Jillian Edwards  [id number]  [id number]  Date of Service: 11/02/2017  Chief Complaint  Patient presents with  . Anxiety  . Medication Refill    The patient is concerned about swelling in both ankles. Has been happening more often, even when it's not hot and humid. curently taking HCTZ 25mg  every day. This has improved since her last visit. Did make some dietary changes, reducing her sodium intake.  She has also noted a difficult time with concentration, especially in the afternoons. Work is piling up and she just looks at it and just doesn't know how to even start. Had to get motivated to do the work that is piling up. Getting very sleeping in the afternoons as well. Increased adderall to 20mg  twice daily when needed. She has done very well with increased dose. No increased problem with sleep.       Current Medication: Outpatient Encounter Medications as of 11/02/2017  Medication Sig  . ALPRAZolam (XANAX) 0.5 MG tablet Take 1 tablet (0.5 mg total) by mouth 2 (two) times daily as needed for anxiety.  Marland Kitchen amphetamine-dextroamphetamine (ADDERALL) 20 MG tablet Take 1 tablet (20 mg total) by mouth 2 (two) times daily.  Marland Kitchen aspirin EC 81 MG tablet Take 81 mg by mouth daily.  Marland Kitchen buPROPion (WELLBUTRIN XL) 150 MG 24 hr tablet Take 2 tablets by mouth daily  . calcium carbonate (TUMS - DOSED IN MG ELEMENTAL CALCIUM) 500 MG chewable tablet Chew 1 tablet by mouth daily.  . cholecalciferol (VITAMIN D) 1000 units tablet Take 1,000 Units by mouth daily.  . ciprofloxacin (CIPRO) 500 MG tablet Take 1 tablet (500 mg total) by mouth 2 (two) times daily.  . diphenoxylate-atropine (LOMOTIL) 2.5-0.025 MG tablet Take 1 to 2 tablets po TID prn diarrhea. Not to exceed 8 tablets in 24 hours  . hydrochlorothiazide (HYDRODIURIL) 25 MG tablet Take 1 tablet (25 mg total) by mouth daily. Take 1 tablet QAM  and 1/2 tablet QPM as needed  . levonorgestrel (MIRENA) 20 MCG/24HR IUD 1 each by Intrauterine route once.  . meloxicam (MOBIC) 7.5 MG tablet Take 1 tablet (7.5 mg total) by mouth daily.  . mupirocin ointment (BACTROBAN) 2 % Apply to affected area BID for 10 days  . [DISCONTINUED] amphetamine-dextroamphetamine (ADDERALL) 20 MG tablet Take 1 tablet (20 mg total) by mouth 2 (two) times daily.  . [DISCONTINUED] amphetamine-dextroamphetamine (ADDERALL) 20 MG tablet Take 1 tablet (20 mg total) by mouth 2 (two) times daily.  . [DISCONTINUED] amphetamine-dextroamphetamine (ADDERALL) 20 MG tablet Take 1 tablet (20 mg total) by mouth 2 (two) times daily.  . [DISCONTINUED] amphetamine-dextroamphetamine (ADDERALL) 20 MG tablet Take 1 tablet (20 mg total) by mouth 2 (two) times daily.  . [DISCONTINUED] amoxicillin (AMOXIL) 875 MG tablet Take 1 tablet (875 mg total) by mouth 2 (two) times daily. (Patient not taking: Reported on 11/02/2017)  . [DISCONTINUED] buPROPion (WELLBUTRIN XL) 150 MG 24 hr tablet Take 2 tablets by mouth daily   No facility-administered encounter medications on file as of 11/02/2017.     Surgical History: Past Surgical History:  Procedure Laterality Date  . BREAST BIOPSY Left 02/06/2013   stereotactic biopsy of calcifications-benign  . BREAST ENHANCEMENT SURGERY  08/2003  . COLONOSCOPY WITH PROPOFOL N/A 12/14/2016   Procedure: COLONOSCOPY WITH PROPOFOL;  Surgeon: Jonathon Bellows, MD;  Location: [facility] ENDOSCOPY;  Service: Gastroenterology;  Laterality: N/A;  . COLPOSCOPY  03/1998; 07/1999   2000-no lesion; 2001-CIN1  . ENDOMETRIAL BIOPSY  03/2007   proliferative    Medical History: Past Medical History:  Diagnosis Date  . ADD (attention deficit disorder)   . Anxiety   . Depression   . Frequent urination   . Herpes genitalis   . Hypertension   . Stress headaches   . Stroke ([place]) ~2005    loss of peripheral vision  . Stroke or transient ischemic attack (TIA) diagnosed during current  admission   . Urological disorder    bladder has dropped    Family History: Family History  Problem Relation Age of Onset  . Hypertension Father   . Diabetes Father        Has also had an amputation  . Heart attack Father   . Diabetes Mellitus II Brother   . Hypertension Brother   . Breast cancer Maternal Grandmother   . Diabetes Mellitus II Paternal Grandmother     Social History   Socioeconomic History  . Marital status: Legally Separated    Spouse name: Not on file  . Number of children: 1  . Years of education: 63  . Highest education level: Not on file  Occupational History  . Occupation: Corporate treasurer  Social Needs  . Financial resource strain: Not on file  . Food insecurity:    Worry: Not on file    Inability: Not on file  . Transportation needs:    Medical: Not on file    Non-medical: Not on file  Tobacco Use  . Smoking status: Never Smoker  . Smokeless tobacco: Never Used  Substance and Sexual Activity  . Alcohol use: No    Alcohol/week: 0.0 standard drinks  . Drug use: No  . Sexual activity: Yes    Birth control/protection: IUD  Lifestyle  . Physical activity:    Days per week: Not on file    Minutes per session: Not on file  . Stress: Not on file  Relationships  . Social connections:    Talks on phone: Not on file    Gets together: Not on file    Attends religious service: Not on file    Active member of club or organization: Not on file    Attends meetings of clubs or organizations: Not on file    Relationship status: Not on file  . Intimate partner violence:    Fear of current or ex partner: Not on file    Emotionally abused: Not on file    Physically abused: Not on file    Forced sexual activity: Not on file  Other Topics Concern  . Not on file  Social History Narrative  . Not on file      Review of Systems  Constitutional: Negative for activity change, fatigue and fever.  HENT: Negative for congestion, rhinorrhea,  sinus pressure, sinus pain, sneezing and sore throat.   Eyes: Negative.   Respiratory: Negative for cough and wheezing.   Cardiovascular: Negative for chest pain, palpitations and leg swelling.  Gastrointestinal: Negative for nausea and vomiting.  Endocrine: Negative for cold intolerance, heat intolerance, polydipsia, polyphagia and polyuria.  Musculoskeletal: Negative for arthralgias, back pain and myalgias.  Skin: Negative for color change, pallor, rash and wound.  Allergic/Immunologic: Positive for environmental allergies.  Neurological: Positive for headaches.  Hematological: Negative for adenopathy. Does not bruise/bleed easily.  Psychiatric/Behavioral: Positive for decreased concentration and sleep disturbance. The patient is nervous/anxious.     Today's Vitals   11/02/17 3545  BP: 130/80  Pulse: 72  Resp: 16  SpO2: 98%  Weight: 163 lb (73.9 kg)  Height: 5\' 7"  (1.702 m)    Physical Exam  Constitutional: She is oriented to person, place, and time. She appears well-developed and well-nourished. No distress.  HENT:  Head: Normocephalic and atraumatic.  Nose: Nose normal.  Mouth/Throat: Oropharynx is clear and moist. No oropharyngeal exudate.  Eyes: Pupils are equal, round, and reactive to light. Conjunctivae and EOM are normal.  Neck: Normal range of motion. Neck supple. No JVD present. No tracheal deviation present. No thyromegaly present.  Cardiovascular: Normal rate, regular rhythm and normal heart sounds. Exam reveals no gallop and no friction rub.  No murmur heard. Pulmonary/Chest: Effort normal and breath sounds normal. No respiratory distress. She has no wheezes. She has no rales. She exhibits no tenderness.  Abdominal: Soft. Bowel sounds are normal. There is no tenderness.  Musculoskeletal: Normal range of motion.  Lymphadenopathy:    She has no cervical adenopathy.  Neurological: She is alert and oriented to person, place, and time. No cranial nerve deficit.  Skin:  Skin is warm and dry. Capillary refill takes less than 2 seconds. She is not diaphoretic.  Psychiatric: She has a normal mood and affect. Her behavior is normal. Judgment and thought content normal.  Nursing note and vitals reviewed.  Assessment/Plan: 1. Essential hypertension Stable.   2. Edema of both feet Improved. Continue with lowered salt content in diet. Take fluid pill as indicated   3. Encounter for long-term (current) use of medications - POCT Urine Drug Screen appropriately positive for AMP only.  4. Attention and concentration deficit May continue adderall 20mg  BID when needed. Three 30 day prescription provided. Dates are 11/02/2017, 12/01/2017, and 12/29/2017 - amphetamine-dextroamphetamine (ADDERALL) 20 MG tablet; Take 1 tablet (20 mg total) by mouth 2 (two) times daily.  Dispense: 60 tablet; Refill: 0  General Counseling: Gaylon verbalizes understanding of the findings of todays visit and agrees with plan of treatment. I have discussed any further diagnostic evaluation that may be needed or ordered today. We also reviewed her medications today. she has been encouraged to call the office with any questions or concerns that should arise related to todays visit.  This patient was seen by Leretha Pol FNP Collaboration with Dr Lavera Guise as a part of collaborative care agreement  Orders Placed This Encounter  Procedures  . POCT Urine Drug Screen    Meds ordered this encounter  Medications  . DISCONTD: amphetamine-dextroamphetamine (ADDERALL) 20 MG tablet    Sig: Take 1 tablet (20 mg total) by mouth 2 (two) times daily.    Dispense:  60 tablet    Refill:  0    Order Specific Question:   Supervising Provider    Answer:   Lavera Guise [[id number]]  . DISCONTD: amphetamine-dextroamphetamine (ADDERALL) 20 MG tablet    Sig: Take 1 tablet (20 mg total) by mouth 2 (two) times daily.    Dispense:  60 tablet    Refill:  0    Order Specific Question:   Supervising Provider     Answer:   Lavera Guise [[id number]]  . DISCONTD: amphetamine-dextroamphetamine (ADDERALL) 20 MG tablet    Sig: Take 1 tablet (20 mg total) by mouth 2 (two) times daily.    Dispense:  60 tablet    Refill:  0    Fill after 9/67/2019    Order Specific Question:   Supervising Provider    Answer:   Humphrey Rolls,  Benjamin Perez M [[id number]]  . amphetamine-dextroamphetamine (ADDERALL) 20 MG tablet    Sig: Take 1 tablet (20 mg total) by mouth 2 (two) times daily.    Dispense:  60 tablet    Refill:  0    Fill after 12/29/2017    Order Specific Question:   Supervising Provider    Answer:   Lavera Guise [[id number]]    Time spent: 36 Minutes      Dr Lavera Guise Internal medicine

## 2017-11-07 DIAGNOSIS — D229 Melanocytic nevi, unspecified: Secondary | ICD-10-CM | POA: Diagnosis not present

## 2017-11-07 DIAGNOSIS — D239 Other benign neoplasm of skin, unspecified: Secondary | ICD-10-CM

## 2017-11-07 DIAGNOSIS — D2271 Melanocytic nevi of right lower limb, including hip: Secondary | ICD-10-CM | POA: Diagnosis not present

## 2017-11-07 DIAGNOSIS — D485 Neoplasm of uncertain behavior of skin: Secondary | ICD-10-CM | POA: Diagnosis not present

## 2017-11-07 DIAGNOSIS — Z1283 Encounter for screening for malignant neoplasm of skin: Secondary | ICD-10-CM | POA: Diagnosis not present

## 2017-11-07 DIAGNOSIS — D225 Melanocytic nevi of trunk: Secondary | ICD-10-CM | POA: Diagnosis not present

## 2017-11-07 DIAGNOSIS — L719 Rosacea, unspecified: Secondary | ICD-10-CM | POA: Diagnosis not present

## 2017-11-07 DIAGNOSIS — L578 Other skin changes due to chronic exposure to nonionizing radiation: Secondary | ICD-10-CM | POA: Diagnosis not present

## 2017-11-07 HISTORY — DX: Other benign neoplasm of skin, unspecified: D23.9

## 2017-11-19 DIAGNOSIS — H00022 Hordeolum internum right lower eyelid: Secondary | ICD-10-CM | POA: Diagnosis not present

## 2017-11-27 ENCOUNTER — Other Ambulatory Visit: Payer: Self-pay

## 2017-11-27 DIAGNOSIS — I10 Essential (primary) hypertension: Secondary | ICD-10-CM

## 2017-11-27 MED ORDER — HYDROCHLOROTHIAZIDE 25 MG PO TABS
25.0000 mg | ORAL_TABLET | Freq: Every day | ORAL | 3 refills | Status: DC
Start: 2017-11-27 — End: 2018-04-26

## 2017-12-27 ENCOUNTER — Other Ambulatory Visit: Payer: Self-pay

## 2017-12-27 MED ORDER — BUPROPION HCL ER (XL) 150 MG PO TB24: ORAL_TABLET | ORAL | 0 refills | Status: DC

## 2018-01-24 ENCOUNTER — Ambulatory Visit (INDEPENDENT_AMBULATORY_CARE_PROVIDER_SITE_OTHER): Payer: BLUE CROSS/BLUE SHIELD | Admitting: Nurse Practitioner

## 2018-01-24 ENCOUNTER — Encounter: Payer: Self-pay | Admitting: Nurse Practitioner

## 2018-01-24 VITALS — BP 132/79 | HR 75 | Resp 16 | Ht 67.0 in | Wt 158.4 lb

## 2018-01-24 DIAGNOSIS — F411 Generalized anxiety disorder: Secondary | ICD-10-CM

## 2018-01-24 DIAGNOSIS — R5383 Other fatigue: Secondary | ICD-10-CM

## 2018-01-24 DIAGNOSIS — I10 Essential (primary) hypertension: Secondary | ICD-10-CM | POA: Diagnosis not present

## 2018-01-24 DIAGNOSIS — R41840 Attention and concentration deficit: Secondary | ICD-10-CM | POA: Diagnosis not present

## 2018-01-24 DIAGNOSIS — N959 Unspecified menopausal and perimenopausal disorder: Secondary | ICD-10-CM

## 2018-01-24 MED ORDER — AMPHETAMINE-DEXTROAMPHETAMINE 20 MG PO TABS: 20.0000 mg | ORAL_TABLET | Freq: Two times a day (BID) | ORAL | 0 refills | Status: DC

## 2018-01-24 MED ORDER — BUPROPION HCL ER (XL) 150 MG PO TB24: ORAL_TABLET | ORAL | 1 refills | Status: DC

## 2018-01-24 NOTE — Progress Notes (Signed)
[facility] [place], [place] [address]  Internal MEDICINE  Office Visit Note  Patient Name: Jillian Edwards  [id number]  [id number]  Date of Service: 01/26/2018  Chief Complaint  Patient presents with  . Medical Management of Chronic Issues    3 month follow up, pt wants to know about getting more blood work done have some concerns about menopause  . Hypertension  . Quality Metric Gaps    shingles vaccine, tetanus vaccine    She has a difficult time with concentration, especially in the afternoons. Work is piling up and she just looks at it and just doesn't know how to even start. Had to get motivated to do the work that is piling up. Getting very sleeping in the afternoons as well. Increased adderall to 20mg  twice daily when needed. She has done very well with increased dose. No increased problem with sleep.  She is c/o increased incidence of hot flashes, night sweats, and irritability. She would like to check reproductive hormones to see if perimenopause may be leading to her symptoms.       Current Medication: Outpatient Encounter Medications as of 01/24/2018  Medication Sig  . ALPRAZolam (XANAX) 0.5 MG tablet Take 1 tablet (0.5 mg total) by mouth 2 (two) times daily as needed for anxiety.  Marland Kitchen amphetamine-dextroamphetamine (ADDERALL) 20 MG tablet Take 1 tablet (20 mg total) by mouth 2 (two) times daily.  Marland Kitchen aspirin EC 81 MG tablet Take 81 mg by mouth daily.  Marland Kitchen buPROPion (WELLBUTRIN XL) 150 MG 24 hr tablet Take 2 tablets by mouth daily  . calcium carbonate (TUMS - DOSED IN MG ELEMENTAL CALCIUM) 500 MG chewable tablet Chew 1 tablet by mouth daily.  . cholecalciferol (VITAMIN D) 1000 units tablet Take 1,000 Units by mouth daily.  . hydrochlorothiazide (HYDRODIURIL) 25 MG tablet Take 1 tablet (25 mg total) by mouth daily. Take 1 tablet QAM and 1/2 tablet QPM as needed  . levonorgestrel (MIRENA) 20 MCG/24HR IUD 1 each by Intrauterine route once.  . meloxicam  (MOBIC) 7.5 MG tablet Take 1 tablet (7.5 mg total) by mouth daily.  . [DISCONTINUED] amphetamine-dextroamphetamine (ADDERALL) 20 MG tablet Take 1 tablet (20 mg total) by mouth 2 (two) times daily.  . [DISCONTINUED] amphetamine-dextroamphetamine (ADDERALL) 20 MG tablet Take 1 tablet (20 mg total) by mouth 2 (two) times daily.  . [DISCONTINUED] amphetamine-dextroamphetamine (ADDERALL) 20 MG tablet Take 1 tablet (20 mg total) by mouth 2 (two) times daily.  . [DISCONTINUED] buPROPion (WELLBUTRIN XL) 150 MG 24 hr tablet Take 2 tablets by mouth daily  . ciprofloxacin (CIPRO) 500 MG tablet Take 1 tablet (500 mg total) by mouth 2 (two) times daily. (Patient not taking: Reported on 01/24/2018)  . diphenoxylate-atropine (LOMOTIL) 2.5-0.025 MG tablet Take 1 to 2 tablets po TID prn diarrhea. Not to exceed 8 tablets in 24 hours  . mupirocin ointment (BACTROBAN) 2 % Apply to affected area BID for 10 days (Patient not taking: Reported on 01/24/2018)   No facility-administered encounter medications on file as of 01/24/2018.     Surgical History: Past Surgical History:  Procedure Laterality Date  . BREAST BIOPSY Left 02/06/2013   stereotactic biopsy of calcifications-benign  . BREAST ENHANCEMENT SURGERY  08/2003  . COLONOSCOPY WITH PROPOFOL N/A 12/14/2016   Procedure: COLONOSCOPY WITH PROPOFOL;  Surgeon: Jonathon Bellows, MD;  Location[facility] ENDOSCOPY;  Service: Gastroenterology;  Laterality: N/A;  . COLPOSCOPY  03/1998; 07/1999   2000-no lesion; 2001-CIN1  . ENDOMETRIAL BIOPSY  03/2007  proliferative    Medical History: Past Medical History:  Diagnosis Date  . ADD (attention deficit disorder)   . Anxiety   . Depression   . Frequent urination   . Herpes genitalis   . Hypertension   . Stress headaches   . Stroke ([place]) ~2005    loss of peripheral vision  . Stroke or transient ischemic attack (TIA) diagnosed during current admission   . Urological disorder    bladder has dropped    Family  History: Family History  Problem Relation Age of Onset  . Hypertension Father   . Diabetes Father        Has also had an amputation  . Heart attack Father   . Diabetes Mellitus II Brother   . Hypertension Brother   . Breast cancer Maternal Grandmother   . Diabetes Mellitus II Paternal Grandmother     Social History   Socioeconomic History  . Marital status: Legally Separated    Spouse name: Not on file  . Number of children: 1  . Years of education: 25  . Highest education level: Not on file  Occupational History  . Occupation: Corporate treasurer  Social Needs  . Financial resource strain: Not on file  . Food insecurity:    Worry: Not on file    Inability: Not on file  . Transportation needs:    Medical: Not on file    Non-medical: Not on file  Tobacco Use  . Smoking status: Never Smoker  . Smokeless tobacco: Never Used  Substance and Sexual Activity  . Alcohol use: No    Alcohol/week: 0.0 standard drinks  . Drug use: No  . Sexual activity: Yes    Birth control/protection: IUD  Lifestyle  . Physical activity:    Days per week: Not on file    Minutes per session: Not on file  . Stress: Not on file  Relationships  . Social connections:    Talks on phone: Not on file    Gets together: Not on file    Attends religious service: Not on file    Active member of club or organization: Not on file    Attends meetings of clubs or organizations: Not on file    Relationship status: Not on file  . Intimate partner violence:    Fear of current or ex partner: Not on file    Emotionally abused: Not on file    Physically abused: Not on file    Forced sexual activity: Not on file  Other Topics Concern  . Not on file  Social History Narrative  . Not on file      Review of Systems  Constitutional: Negative for activity change, fatigue and fever.  HENT: Negative for congestion, rhinorrhea, sinus pressure, sinus pain, sneezing and sore throat.   Eyes: Negative.    Respiratory: Negative for cough and wheezing.   Cardiovascular: Negative for chest pain, palpitations and leg swelling.  Gastrointestinal: Negative for nausea and vomiting.  Endocrine: Positive for heat intolerance. Negative for cold intolerance, polydipsia, polyphagia and polyuria.  Musculoskeletal: Negative for arthralgias, back pain and myalgias.  Skin: Negative for color change, pallor, rash and wound.  Allergic/Immunologic: Positive for environmental allergies.  Neurological: Positive for headaches.  Hematological: Negative for adenopathy. Does not bruise/bleed easily.  Psychiatric/Behavioral: Positive for decreased concentration and sleep disturbance. The patient is nervous/anxious.     Today's Vitals   01/24/18 1607  BP: 132/79  Pulse: 75  Resp: 16  SpO2: 93%  Weight: 158 lb 6.4 oz (71.8 kg)  Height: 5\' 7"  (1.702 m)    Physical Exam  Constitutional: She is oriented to person, place, and time. She appears well-developed and well-nourished. No distress.  HENT:  Head: Normocephalic and atraumatic.  Nose: Nose normal.  Mouth/Throat: Oropharynx is clear and moist. No oropharyngeal exudate.  Eyes: Pupils are equal, round, and reactive to light. Conjunctivae and EOM are normal.  Neck: Normal range of motion. Neck supple. No JVD present. No tracheal deviation present. No thyromegaly present.  Cardiovascular: Normal rate, regular rhythm and normal heart sounds. Exam reveals no gallop and no friction rub.  No murmur heard. Pulmonary/Chest: Effort normal and breath sounds normal. No respiratory distress. She has no wheezes. She has no rales. She exhibits no tenderness.  Abdominal: Soft. Bowel sounds are normal. There is no tenderness.  Musculoskeletal: Normal range of motion.  Lymphadenopathy:    She has no cervical adenopathy.  Neurological: She is alert and oriented to person, place, and time. No cranial nerve deficit.  Skin: Skin is warm and dry. Capillary refill takes less  than 2 seconds. She is not diaphoretic.  Psychiatric: She has a normal mood and affect. Her behavior is normal. Judgment and thought content normal.  Nursing note and vitals reviewed.  Assessment/Plan: 1. Essential hypertension Stable. Continue HCTZ as prescribed   2. Other fatigue Check labs, including anemia and thyroid panels. Treat as indicated  3. Unspecified menopausal and perimenopausal disorder Increased hot flashes, irritability, and night sweating. Will check reproductive hormones and treat as indicated   4. Attention and concentration deficit May continue adderall 20mg  twice daily as needed. Three 30 day prescriptions provided. Dates are 01/24/2018, 02/22/2018, and 03/22/2018 - amphetamine-dextroamphetamine (ADDERALL) 20 MG tablet; Take 1 tablet (20 mg total) by mouth 2 (two) times daily.  Dispense: 60 tablet; Refill: 0  5. GAD (generalized anxiety disorder) Continue bupropion 150mg  daily. May use alprazolam as needed and as previously prescribed.  - buPROPion (WELLBUTRIN XL) 150 MG 24 hr tablet; Take 2 tablets by mouth daily  Dispense: 180 tablet; Refill: 1  General Counseling: Naava verbalizes understanding of the findings of todays visit and agrees with plan of treatment. I have discussed any further diagnostic evaluation that may be needed or ordered today. We also reviewed her medications today. she has been encouraged to call the office with any questions or concerns that should arise related to todays visit.  Refilled Controlled medications today. Reviewed risks and possible side effects associated with taking Stimulants. Combination of these drugs with other psychotropic medications could cause dizziness and drowsiness. Pt needs to Monitor symptoms and exercise caution in driving and operating heavy machinery to avoid damages to oneself, to others and to the surroundings. Patient verbalized understanding in this matter. Dependence and abuse for these drugs will be  monitored closely. A Controlled substance policy and procedure is on file which allows [facility] to order a urine drug screen test at any visit. Patient understands and agrees with the plan..  This patient was seen by Leretha Pol FNP Collaboration with Dr Lavera Guise as a part of collaborative care agreement  Meds ordered this encounter  Medications  . DISCONTD: amphetamine-dextroamphetamine (ADDERALL) 20 MG tablet    Sig: Take 1 tablet (20 mg total) by mouth 2 (two) times daily.    Dispense:  60 tablet    Refill:  0    Order Specific Question:   Supervising Provider    Answer:   Clayborn Bigness  M [[id number]]  . buPROPion (WELLBUTRIN XL) 150 MG 24 hr tablet    Sig: Take 2 tablets by mouth daily    Dispense:  180 tablet    Refill:  1    Order Specific Question:   Supervising Provider    Answer:   Lavera Guise [place]  . DISCONTD: amphetamine-dextroamphetamine (ADDERALL) 20 MG tablet    Sig: Take 1 tablet (20 mg total) by mouth 2 (two) times daily.    Dispense:  60 tablet    Refill:  0    Fill after 02/22/2018    Order Specific Question:   Supervising Provider    Answer:   Lavera Guise [[id number]]  . amphetamine-dextroamphetamine (ADDERALL) 20 MG tablet    Sig: Take 1 tablet (20 mg total) by mouth 2 (two) times daily.    Dispense:  60 tablet    Refill:  0    Fill after 03/22/2018    Order Specific Question:   Supervising Provider    Answer:   Lavera Guise [[id number]]    Time spent: 25 Minutes      Dr Lavera Guise Internal medicine

## 2018-01-26 DIAGNOSIS — N959 Unspecified menopausal and perimenopausal disorder: Secondary | ICD-10-CM | POA: Insufficient documentation

## 2018-01-26 DIAGNOSIS — R5383 Other fatigue: Secondary | ICD-10-CM | POA: Insufficient documentation

## 2018-02-13 DIAGNOSIS — D485 Neoplasm of uncertain behavior of skin: Secondary | ICD-10-CM | POA: Diagnosis not present

## 2018-02-13 DIAGNOSIS — D225 Melanocytic nevi of trunk: Secondary | ICD-10-CM | POA: Diagnosis not present

## 2018-02-13 DIAGNOSIS — L719 Rosacea, unspecified: Secondary | ICD-10-CM | POA: Diagnosis not present

## 2018-03-28 ENCOUNTER — Encounter: Payer: Self-pay | Admitting: Obstetrics and Gynecology

## 2018-03-28 ENCOUNTER — Ambulatory Visit (INDEPENDENT_AMBULATORY_CARE_PROVIDER_SITE_OTHER): Payer: BLUE CROSS/BLUE SHIELD | Admitting: Obstetrics and Gynecology

## 2018-03-28 VITALS — BP 130/90 | HR 85 | Ht 67.0 in | Wt 168.0 lb

## 2018-03-28 DIAGNOSIS — N6321 Unspecified lump in the left breast, upper outer quadrant: Secondary | ICD-10-CM | POA: Diagnosis not present

## 2018-03-28 DIAGNOSIS — N6320 Unspecified lump in the left breast, unspecified quadrant: Secondary | ICD-10-CM

## 2018-03-28 NOTE — Progress Notes (Signed)
Ronnell Freshwater, NP   Chief Complaint  Patient presents with  . BREAST EXAM    lump on left side of left breast, feels sore x days    HPI:      Ms. GRACILYN GUNIA is a 53 y.o. G1P1001 who LMP was No LMP recorded. (Menstrual status: Perimenopausal)., presents today for LT breast mass and pain that started a few days ago. Pain has improved but mass persists. No erythema, trauma, nipple d/c. She drinks caffeine. No menses with IUD, but is having vasomotor sx/vaginal dryness (past due for annual 8/19).  Last mammo on file 06/09/16 at [place], but pt states she had one there last year, too.  Hx of breast augmentation in 2005 and a stereotactic biopsy of calcifications in 01/2013 LT breast which was benign.  There is a positive history of breast cancer in her maternal grandmother, genetic testing not indicated. Pt does frequent SBE.  Past Medical History:  Diagnosis Date  . ADD (attention deficit disorder)   . Anxiety   . Depression   . Frequent urination   . Herpes genitalis   . Hypertension   . Stress headaches   . Stroke ([place]) ~2005    loss of peripheral vision  . Stroke or transient ischemic attack (TIA) diagnosed during current admission   . 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 BREAST BIOPSY Left 02/06/2013   stereotactic biopsy of calcifications-benign  . BREAST ENHANCEMENT SURGERY  08/2003  . COLONOSCOPY WITH PROPOFOL N/A 12/14/2016   Procedure: COLONOSCOPY WITH PROPOFOL;  Surgeon: Jonathon Bellows, MD;  Location: [facility] ENDOSCOPY;  Service: Gastroenterology;  Laterality: N/A;  . COLPOSCOPY  03/1998; 07/1999   2000-no lesion; 2001-CIN1  . ENDOMETRIAL BIOPSY  03/2007   proliferative    Family History  Problem Relation Age of Onset  . Hypertension Father   . Diabetes Father        Has also had an amputation  . Heart attack Father   . Diabetes Mellitus II Father   . Diabetes Mellitus II Brother   .  Hypertension Brother   . Breast cancer Maternal Grandmother 60  . Diabetes Mellitus II Paternal Grandmother     Social History   Socioeconomic History  . Marital status: Legally Separated    Spouse name: Not on file  . Number of children: 1  . Years of education: 98  . Highest education level: Not on file  Occupational History  . Occupation: Corporate treasurer  Social Needs  . Financial resource strain: Not on file  . Food insecurity:    Worry: Not on file    Inability: Not on file  . Transportation needs:    Medical: Not on file    Non-medical: Not on file  Tobacco Use  . Smoking status: Never Smoker  . Smokeless tobacco: Never Used  Substance and Sexual Activity  . Alcohol use: No    Alcohol/week: 0.0 standard drinks  . Drug use: No  . Sexual activity: Yes    Birth control/protection: I.U.D.    Comment: Mirena  Lifestyle  . Physical activity:    Days per week: Not on file    Minutes per session: Not on file  . Stress: Not on file  Relationships  . Social connections:    Talks on phone: Not on file    Gets together: Not on file    Attends religious service: Not on file  Active member of club or organization: Not on file    Attends meetings of clubs or organizations: Not on file    Relationship status: Not on file  . Intimate partner violence:    Fear of current or ex partner: Not on file    Emotionally abused: Not on file    Physically abused: Not on file    Forced sexual activity: Not on file  Other Topics Concern  . Not on file  Social History Narrative  . Not on file    Outpatient Medications Prior to Visit  Medication Sig Dispense Refill  . ALPRAZolam (XANAX) 0.5 MG tablet Take 1 tablet (0.5 mg total) by mouth 2 (two) times daily as needed for anxiety. 60 tablet 3  . amphetamine-dextroamphetamine (ADDERALL) 20 MG tablet Take 1 tablet (20 mg total) by mouth 2 (two) times daily. 60 tablet 0  . aspirin EC 81 MG tablet Take 81 mg by mouth daily.     Marland Kitchen buPROPion (WELLBUTRIN XL) 150 MG 24 hr tablet Take 2 tablets by mouth daily 180 tablet 1  . calcium carbonate (TUMS - DOSED IN MG ELEMENTAL CALCIUM) 500 MG chewable tablet Chew 1 tablet by mouth daily.    . cholecalciferol (VITAMIN D) 1000 units tablet Take 1,000 Units by mouth daily.    . hydrochlorothiazide (HYDRODIURIL) 25 MG tablet Take 1 tablet (25 mg total) by mouth daily. Take 1 tablet QAM and 1/2 tablet QPM as needed 45 tablet 3  . levonorgestrel (MIRENA) 20 MCG/24HR IUD 1 each by Intrauterine route once.    . meloxicam (MOBIC) 7.5 MG tablet Take 1 tablet (7.5 mg total) by mouth daily. 30 tablet 1  . ciprofloxacin (CIPRO) 500 MG tablet Take 1 tablet (500 mg total) by mouth 2 (two) times daily. (Patient not taking: Reported on 01/24/2018) 20 tablet 0  . diphenoxylate-atropine (LOMOTIL) 2.5-0.025 MG tablet Take 1 to 2 tablets po TID prn diarrhea. Not to exceed 8 tablets in 24 hours 45 tablet 0  . mupirocin ointment (BACTROBAN) 2 % Apply to affected area BID for 10 days (Patient not taking: Reported on 01/24/2018) 22 g 1   No facility-administered medications prior to visit.       ROS:  Review of Systems  Constitutional: Negative for fatigue, fever and unexpected weight change.  Respiratory: Negative for cough, shortness of breath and wheezing.   Cardiovascular: Negative for chest pain, palpitations and leg swelling.  Gastrointestinal: Negative for blood in stool, constipation, diarrhea, nausea and vomiting.  Endocrine: Negative for cold intolerance, heat intolerance and polyuria.  Genitourinary: Positive for dyspareunia. Negative for dysuria, flank pain, frequency, genital sores, hematuria, menstrual problem, pelvic pain, urgency, vaginal bleeding, vaginal discharge and vaginal pain.  Musculoskeletal: Negative for back pain, joint swelling and myalgias.  Skin: Negative for rash.  Neurological: Negative for dizziness, syncope, light-headedness, numbness and headaches.    Hematological: Negative for adenopathy.  Psychiatric/Behavioral: Negative for agitation, confusion, sleep disturbance and suicidal ideas. The patient is not nervous/anxious.    BREAST: pain/mass LT breast   OBJECTIVE:   Vitals:  BP 130/90   Pulse 85   Ht 5\' 7"  (1.702 m)   Wt 168 lb (76.2 kg)   BMI 26.31 kg/m   Physical Exam Vitals signs reviewed.  Neck:     Musculoskeletal: Normal range of motion.  Pulmonary:     Effort: Pulmonary effort is normal.  Chest:     Breasts: Breasts are symmetrical.        Right: No inverted  nipple, mass, nipple discharge, skin change or tenderness.        Left: Mass and tenderness present. No inverted nipple, nipple discharge or skin change.    Abdominal:     Palpations: Abdomen is soft. There is no mass.  Musculoskeletal: Normal range of motion.  Skin:    General: Skin is warm and dry.  Neurological:     General: No focal deficit present.     Mental Status: She is alert and oriented to person, place, and time.     Cranial Nerves: No cranial nerve deficit.  Psychiatric:        Mood and Affect: Mood normal.        Thought Content: Thought content normal.        Judgment: Judgment normal.     Assessment/Plan: Left breast mass - 1:00-3:00 pos LT breast. Check dx mammo and u/s at [facility]. Will f/u with results. Sx improved. Most likely breast cyst with sx hx.  - Plan: US BREAST LTD UNI LEFT INC AXILLA, MM DIAG BREAST TOMO UNI LEFT    Return in about 2 weeks (around 04/11/2018) for annual due--sees CLG.  Keyanni Whittinghill B. Thomasina Housley, PA-C 03/28/2018 12:26 PM

## 2018-03-28 NOTE — Patient Instructions (Signed)
I value your feedback and entrusting us with your care. If you get a [place] patient survey, I would appreciate you taking the time to let us know about your experience today. Thank you! 

## 2018-04-01 IMAGING — MR MR HEAD W/O CM
10 series · 41 of 48 positions shown · non-contrast
Comparison: Brain MRI 08/02/2004.  Head CT 06/24/2008.

CLINICAL DATA: Acute non intractable headache.

EXAM:
MRI HEAD WITHOUT CONTRAST
TECHNIQUE: Multiplanar, multiecho pulse sequences of the brain and surrounding
structures were obtained without intravenous contrast.

[Series 2: T1 · sagittal · 5.0mm · 0.45mm/px · 3 of 28 slices shown (1 of 2)]
[im 1/28]
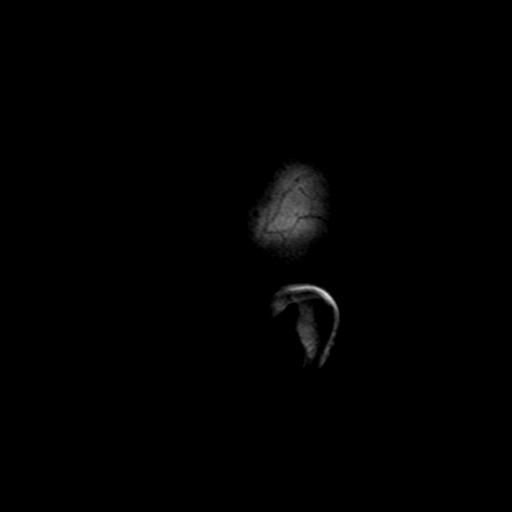
[im 14/28]
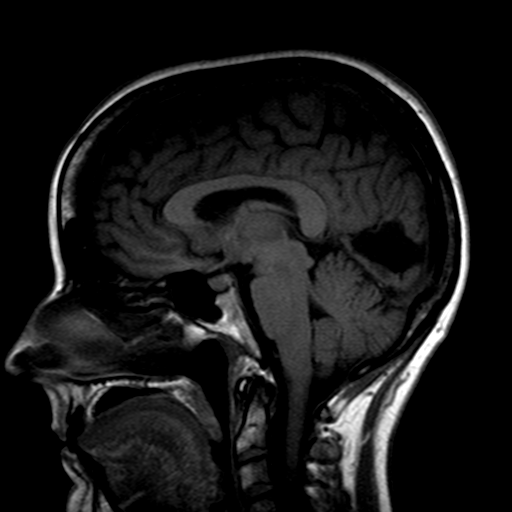
[im 28/28]
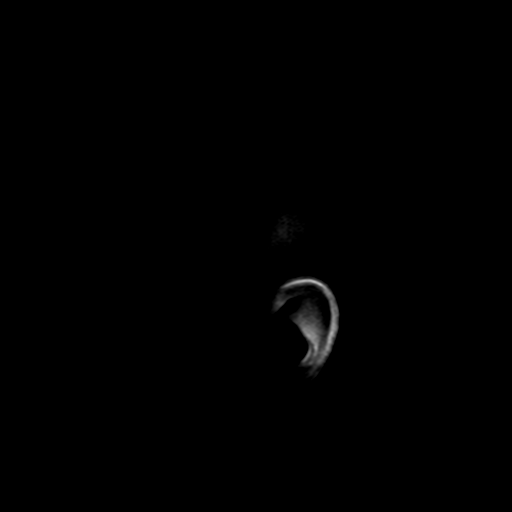

[Series 4: DWI · axial · 4.0mm · 0.94mm/px · z∈[-75,+96]mm · 5 of 44 slices shown (1 of 4)]
[im 1/44]
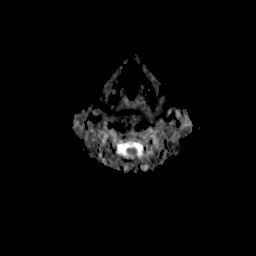
[im 11/44]
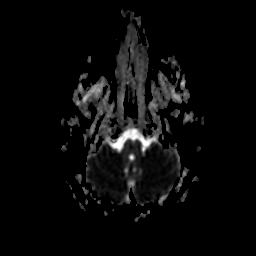
[im 22/44]
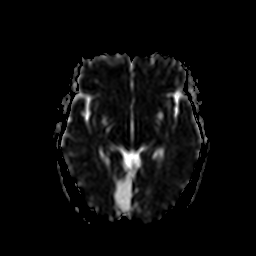
[im 33/44]
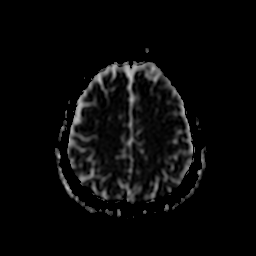
[im 44/44]
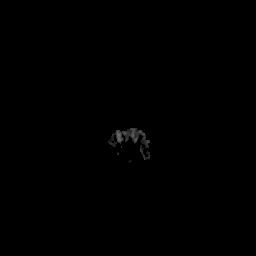

[Series 6: DWI · coronal · 5.0mm · 1.80mm/px · 5 of 38 slices shown (2 of 4)]
[im 1/38]
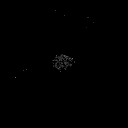
[im 10/38]
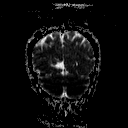
[im 19/38]
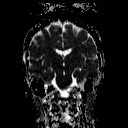
[im 28/38]
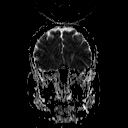
[im 38/38]
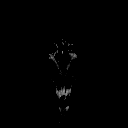

[Series 7: T2 · axial · 5.0mm · 0.45mm/px · z∈[-69,+100]mm · 4 of 27 slices shown (1 of 3)]
[im 1/27]
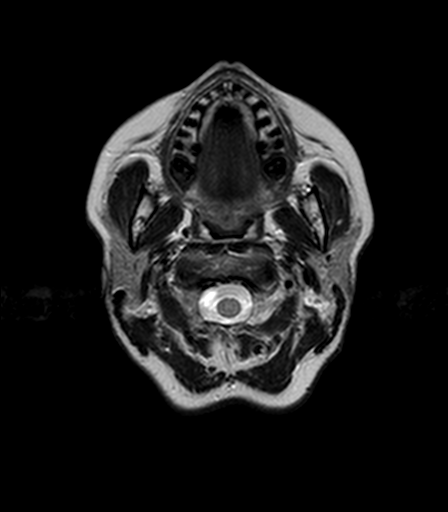
[im 9/27]
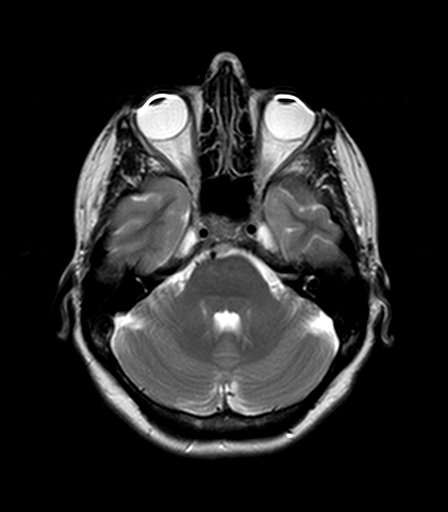
[im 18/27]
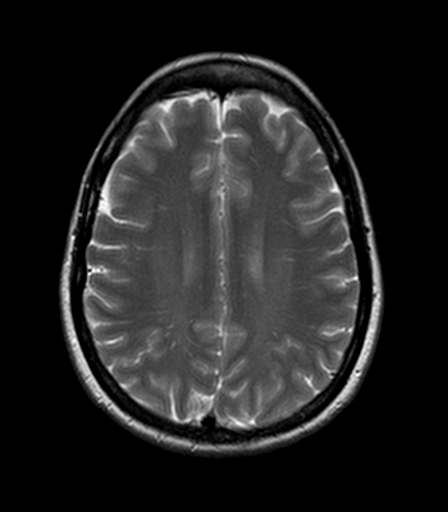
[im 27/27]
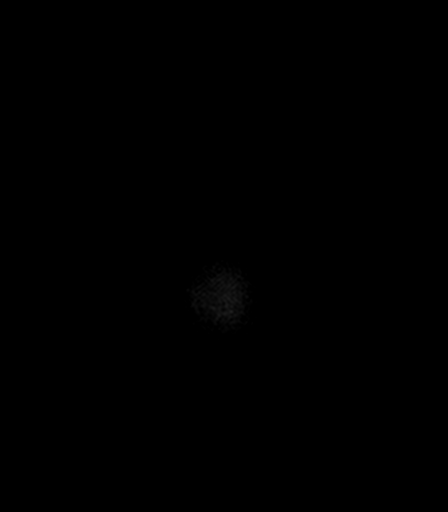

[Series 8: DWI · axial · 4.0mm · 0.94mm/px · z∈[-75,+96]mm · 6 of 44 slices shown (3 of 4)]
[im 1/44]
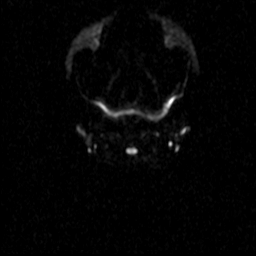
[im 9/44]
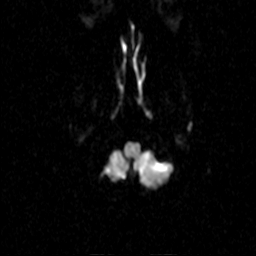
[im 18/44]
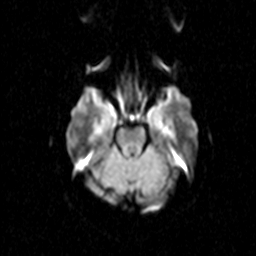
[im 26/44]
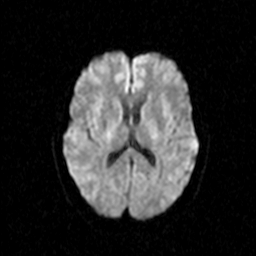
[im 35/44]
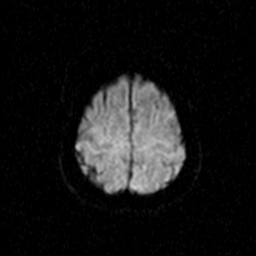
[im 44/44]
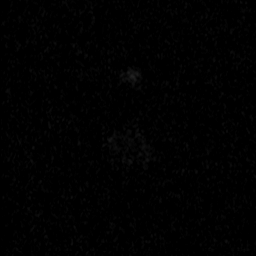

[Series 9: DWI · coronal · 5.0mm · 1.80mm/px · 5 of 37 slices shown (4 of 4)]
[im 1/37]
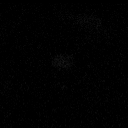
[im 10/37]
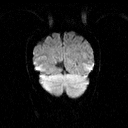
[im 19/37]
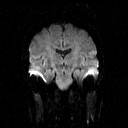
[im 28/37]
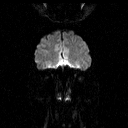
[im 37/37]
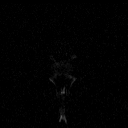

[Series 10: FLAIR · axial · 5.0mm · 0.90mm/px · z∈[-69,+100]mm · 4 of 27 slices shown]
[im 1/27]
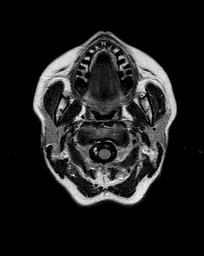
[im 9/27]
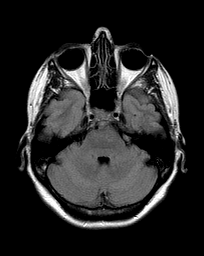
[im 18/27]
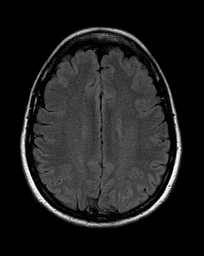
[im 27/27]
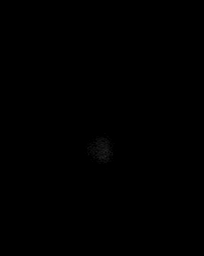

[Series 11: T2 · axial · 5.0mm · 0.45mm/px · z∈[-69,+100]mm · 4 of 27 slices shown (2 of 3)]
[im 1/27]
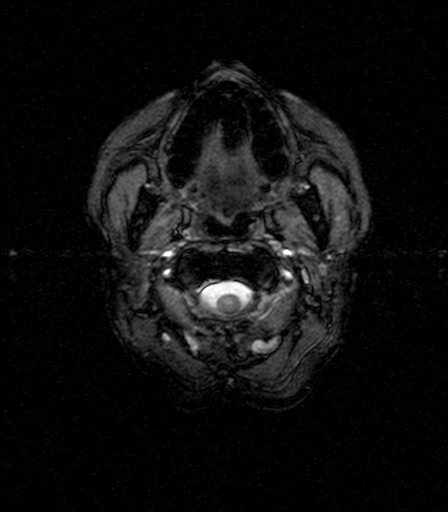
[im 9/27]
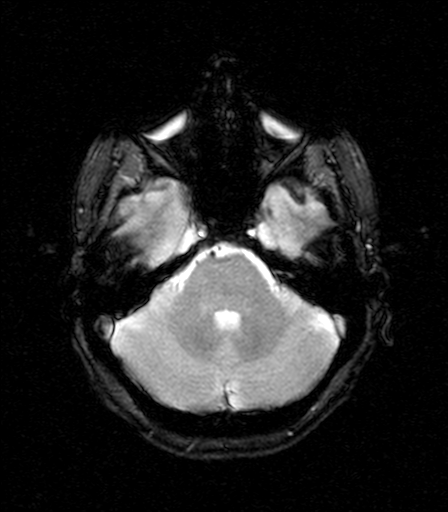
[im 18/27]
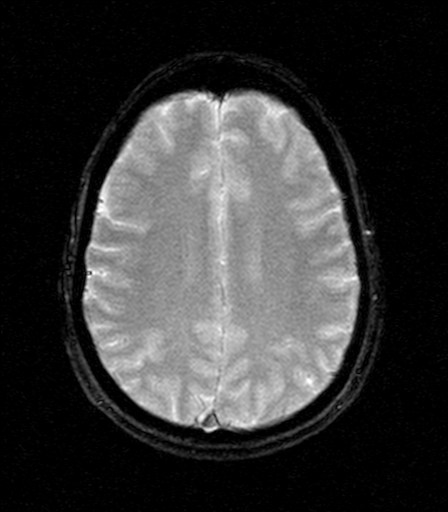
[im 27/27]
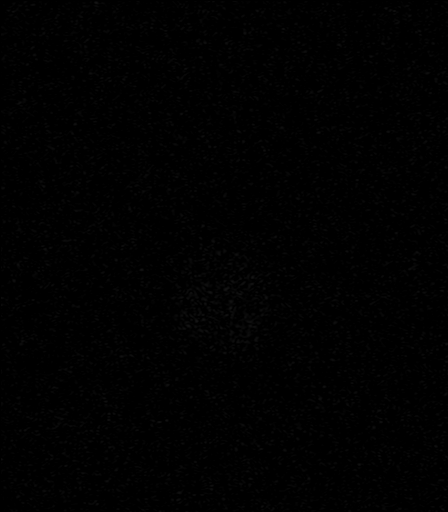

[Series 12: T1 · axial · 3.0mm · 0.45mm/px · 1 of 56 slices shown (2 of 2)]
[im 1/56]
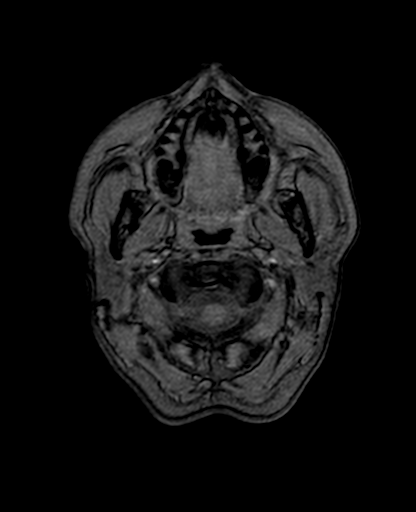

[Series 13: T2 · coronal · 5.0mm · 0.45mm/px · 4 of 30 slices shown (3 of 3)]
[im 1/30]
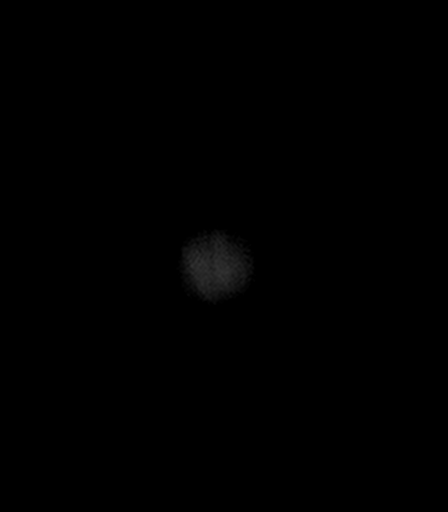
[im 10/30]
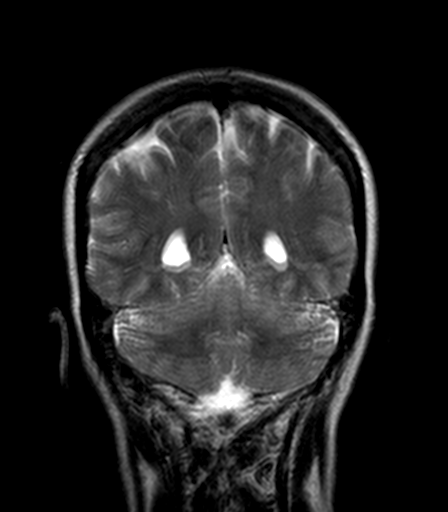
[im 20/30]
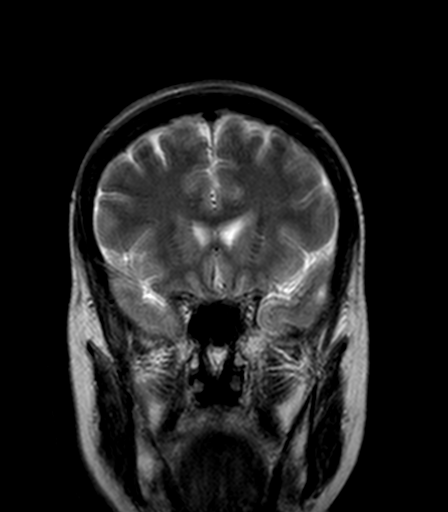
[im 30/30]
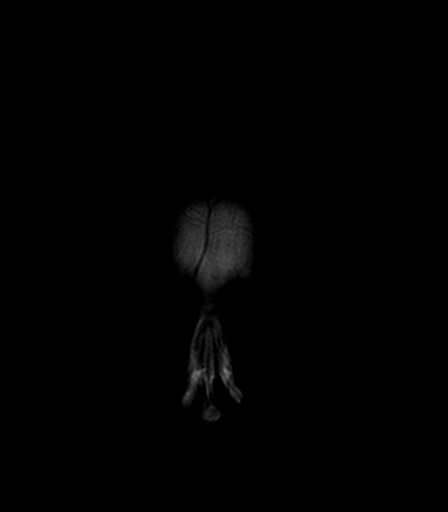

[41 of 48 positions shown; findings below may reference images not displayed]

FINDINGS: Brain: No acute infarction, hemorrhage, hydrocephalus, extra-axial
collection or mass lesion.

Dense gliosis in the parasagittal right occipital lobe related to
infarct that was acute on 4884 MRI.

Vascular: Normal flow voids

Skull and upper cervical spine: Negative

Sinuses/Orbits: Tiny incidental cyst in the midline nasopharynx. No
active sinusitis. Artifact around the anterior globes likely
frommascara.

Other: Sequences at the end of the scan are motion degraded. Study
is overall diagnostic.
IMPRESSION: 1. No acute or reversible finding. No specific explanation for
headache.
2. Remote right occipital infarct that occurred in 4884. No chronic
ischemic changes elsewhere.

## 2018-04-15 DIAGNOSIS — D485 Neoplasm of uncertain behavior of skin: Secondary | ICD-10-CM | POA: Diagnosis not present

## 2018-04-15 DIAGNOSIS — L719 Rosacea, unspecified: Secondary | ICD-10-CM | POA: Diagnosis not present

## 2018-04-24 ENCOUNTER — Ambulatory Visit: Payer: Managed Care, Other (non HMO) | Admitting: Certified Nurse Midwife

## 2018-04-26 ENCOUNTER — Ambulatory Visit (INDEPENDENT_AMBULATORY_CARE_PROVIDER_SITE_OTHER): Payer: Managed Care, Other (non HMO) | Admitting: Nurse Practitioner

## 2018-04-26 ENCOUNTER — Encounter: Payer: Self-pay | Admitting: Nurse Practitioner

## 2018-04-26 VITALS — BP 130/80 | HR 69 | Resp 16 | Ht 67.0 in | Wt 165.0 lb

## 2018-04-26 DIAGNOSIS — I10 Essential (primary) hypertension: Secondary | ICD-10-CM | POA: Diagnosis not present

## 2018-04-26 DIAGNOSIS — R41840 Attention and concentration deficit: Secondary | ICD-10-CM

## 2018-04-26 DIAGNOSIS — F411 Generalized anxiety disorder: Secondary | ICD-10-CM | POA: Diagnosis not present

## 2018-04-26 MED ORDER — LISDEXAMFETAMINE DIMESYLATE 40 MG PO CAPS: 40.0000 mg | ORAL_CAPSULE | ORAL | 0 refills | Status: DC

## 2018-04-26 MED ORDER — HYDROCHLOROTHIAZIDE 25 MG PO TABS: 25.0000 mg | ORAL_TABLET | Freq: Every day | ORAL | 3 refills | Status: DC

## 2018-04-26 MED ORDER — ALPRAZOLAM 0.5 MG PO TABS: 0.5000 mg | ORAL_TABLET | Freq: Two times a day (BID) | ORAL | 3 refills | Status: DC | PRN

## 2018-04-26 MED ORDER — BUPROPION HCL ER (XL) 150 MG PO TB24: ORAL_TABLET | ORAL | 1 refills | Status: DC

## 2018-04-26 NOTE — Progress Notes (Signed)
[facility] [place], [place] [address]  Internal MEDICINE  Office Visit Note  Patient Name: Jillian Edwards  [id number]  [id number]  Date of Service: 04/26/2018  Chief Complaint  Patient presents with  . Hypertension  . Anxiety    She has a difficult time with concentration, especially in the afternoons. Work is piling up and she just looks at it and just doesn't know how to even start. Had to get motivated to do the work that is piling up. Getting very sleeping in the afternoons as well. Increased adderall to 20mg  twice daily when needed. She has been forgetting to take it in the afternoons sometimes. Afraid to take it too late, as it will cause her to increase problems with sleeping. When she does take the second dose, she feels a big crash and gets very sleepy. Would like to try something longer acting and see if that helps the fluctuation of fatigue she is having.  Has noted a bit more trouble with sleeping. Increased anxiety at night. Has found herself taking her xanax more often at night. Has helped her to fall asleep and sleep well. Will need a refill for this.       Current Medication: Outpatient Encounter Medications as of 04/26/2018  Medication Sig Note  . ALPRAZolam (XANAX) 0.5 MG tablet Take 1 tablet (0.5 mg total) by mouth 2 (two) times daily as needed for anxiety.   Marland Kitchen aspirin EC 81 MG tablet Take 81 mg by mouth daily.   Marland Kitchen buPROPion (WELLBUTRIN XL) 150 MG 24 hr tablet Take 2 tablets by mouth daily   . calcium carbonate (TUMS - DOSED IN MG ELEMENTAL CALCIUM) 500 MG chewable tablet Chew 1 tablet by mouth daily.   . cholecalciferol (VITAMIN D) 1000 units tablet Take 1,000 Units by mouth daily.   . hydrochlorothiazide (HYDRODIURIL) 25 MG tablet Take 1 tablet (25 mg total) by mouth daily. Take 1 tablet QAM and 1/2 tablet QPM as needed   . levonorgestrel (MIRENA) 20 MCG/24HR IUD 1 each by Intrauterine route once.   . meloxicam (MOBIC) 7.5 MG tablet  Take 1 tablet (7.5 mg total) by mouth daily.   . [DISCONTINUED] ALPRAZolam (XANAX) 0.5 MG tablet Take 1 tablet (0.5 mg total) by mouth 2 (two) times daily as needed for anxiety.   . [DISCONTINUED] amphetamine-dextroamphetamine (ADDERALL) 20 MG tablet Take 1 tablet (20 mg total) by mouth 2 (two) times daily. 04/26/2018: will change to vyvanse 40mg  daily.   . [DISCONTINUED] buPROPion (WELLBUTRIN XL) 150 MG 24 hr tablet Take 2 tablets by mouth daily   . [DISCONTINUED] hydrochlorothiazide (HYDRODIURIL) 25 MG tablet Take 1 tablet (25 mg total) by mouth daily. Take 1 tablet QAM and 1/2 tablet QPM as needed   . lisdexamfetamine (VYVANSE) 40 MG capsule Take 1 capsule (40 mg total) by mouth every morning.   . [DISCONTINUED] lisdexamfetamine (VYVANSE) 40 MG capsule Take 1 capsule (40 mg total) by mouth every morning.   . [DISCONTINUED] lisdexamfetamine (VYVANSE) 40 MG capsule Take 1 capsule (40 mg total) by mouth every morning.    No facility-administered encounter medications on file as of 04/26/2018.     Surgical History: Past Surgical History:  Procedure Laterality Date  . BREAST BIOPSY Left 02/06/2013   stereotactic biopsy of calcifications-benign  . BREAST ENHANCEMENT SURGERY  08/2003  . COLONOSCOPY WITH PROPOFOL N/A 12/14/2016   Procedure: COLONOSCOPY WITH PROPOFOL;  Surgeon: Jonathon Bellows, MD;  Location: [facility] ENDOSCOPY;  Service: Gastroenterology;  Laterality: N/A;  .  COLPOSCOPY  03/1998; 07/1999   2000-no lesion; 2001-CIN1  . ENDOMETRIAL BIOPSY  03/2007   proliferative    Medical History: Past Medical History:  Diagnosis Date  . ADD (attention deficit disorder)   . Anxiety   . Depression   . Frequent urination   . Herpes genitalis   . Hypertension   . Stress headaches   . Stroke ([place]) ~2005    loss of peripheral vision  . Stroke or transient ischemic attack (TIA) diagnosed during current admission   . Urological disorder    bladder has dropped    Family History: Family History   Problem Relation Age of Onset  . Hypertension Father   . Diabetes Father        Has also had an amputation  . Heart attack Father   . Diabetes Mellitus II Father   . Diabetes Mellitus II Brother   . Hypertension Brother   . Breast cancer Maternal Grandmother 60  . Diabetes Mellitus II Paternal Grandmother     Social History   Socioeconomic History  . Marital status: Legally Separated    Spouse name: Not on file  . Number of children: 1  . Years of education: 22  . Highest education level: Not on file  Occupational History  . Occupation: Corporate treasurer  Social Needs  . Financial resource strain: Not on file  . Food insecurity:    Worry: Not on file    Inability: Not on file  . Transportation needs:    Medical: Not on file    Non-medical: Not on file  Tobacco Use  . Smoking status: Never Smoker  . Smokeless tobacco: Never Used  Substance and Sexual Activity  . Alcohol use: No    Alcohol/week: 0.0 standard drinks  . Drug use: No  . Sexual activity: Yes    Birth control/protection: I.U.D.    Comment: Mirena  Lifestyle  . Physical activity:    Days per week: Not on file    Minutes per session: Not on file  . Stress: Not on file  Relationships  . Social connections:    Talks on phone: Not on file    Gets together: Not on file    Attends religious service: Not on file    Active member of club or organization: Not on file    Attends meetings of clubs or organizations: Not on file    Relationship status: Not on file  . Intimate partner violence:    Fear of current or ex partner: Not on file    Emotionally abused: Not on file    Physically abused: Not on file    Forced sexual activity: Not on file  Other Topics Concern  . Not on file  Social History Narrative  . Not on file      Review of Systems  Constitutional: Positive for fatigue. Negative for activity change and fever.  HENT: Positive for ear pain. Negative for congestion, rhinorrhea, sinus  pressure, sinus pain, sneezing and sore throat.   Respiratory: Negative for cough, chest tightness and wheezing.   Cardiovascular: Negative for chest pain, palpitations and leg swelling.  Gastrointestinal: Negative for constipation, diarrhea, nausea and vomiting.  Endocrine: Positive for heat intolerance. Negative for cold intolerance, polydipsia, polyphagia and polyuria.  Musculoskeletal: Negative for arthralgias, back pain and myalgias.  Skin: Negative for color change, pallor, rash and wound.  Allergic/Immunologic: Positive for environmental allergies.  Neurological: Positive for headaches.  Hematological: Negative for adenopathy. Does not bruise/bleed easily.  Psychiatric/Behavioral: Positive for decreased concentration and sleep disturbance. The patient is nervous/anxious.     Today's Vitals   04/26/18 0839  BP: 130/80  Pulse: 69  Resp: 16  SpO2: 98%  Weight: 165 lb (74.8 kg)  Height: 5\' 7"  (1.702 m)   Body mass index is 25.84 kg/m.  Physical Exam Vitals signs and nursing note reviewed.  Constitutional:      General: She is not in acute distress.    Appearance: Normal appearance. She is well-developed. She is not diaphoretic.  HENT:     Head: Normocephalic and atraumatic.     Right Ear: Ear canal normal.     Left Ear: Ear canal normal.     Nose: Nose normal.     Mouth/Throat:     Mouth: Mucous membranes are moist.     Pharynx: Oropharynx is clear. No oropharyngeal exudate.  Eyes:     Conjunctiva/sclera: Conjunctivae normal.     Pupils: Pupils are equal, round, and reactive to light.  Neck:     Musculoskeletal: Normal range of motion and neck supple.     Thyroid: No thyromegaly.     Vascular: No JVD.     Trachea: No tracheal deviation.  Cardiovascular:     Rate and Rhythm: Normal rate and regular rhythm.     Heart sounds: Normal heart sounds. No murmur. No friction rub. No gallop.   Pulmonary:     Effort: Pulmonary effort is normal. No respiratory distress.      Breath sounds: Normal breath sounds. No wheezing or rales.  Chest:     Chest wall: No tenderness.  Abdominal:     General: Bowel sounds are normal.     Palpations: Abdomen is soft.     Tenderness: There is no abdominal tenderness.  Musculoskeletal: Normal range of motion.  Lymphadenopathy:     Cervical: No cervical adenopathy.  Skin:    General: Skin is warm and dry.     Capillary Refill: Capillary refill takes less than 2 seconds.  Neurological:     Mental Status: She is alert and oriented to person, place, and time.     Cranial Nerves: No cranial nerve deficit.  Psychiatric:        Behavior: Behavior normal.        Thought Content: Thought content normal.        Judgment: Judgment normal.    Assessment/Plan:  1. Essential hypertension Stable. contineu bp medication as prescribed.  - hydrochlorothiazide (HYDRODIURIL) 25 MG tablet; Take 1 tablet (25 mg total) by mouth daily. Take 1 tablet QAM and 1/2 tablet QPM as needed  Dispense: 45 tablet; Refill: 3  2. Attention and concentration deficit Change adderall to vyvanse 40mg  daily. Advised she take in the morning. Three 30 day prescriptions sent to her pharmacy. Dates are 04/26/2018, 05/25/2018, and 06/22/2018 - lisdexamfetamine (VYVANSE) 40 MG capsule; Take 1 capsule (40 mg total) by mouth every morning.  Dispense: 30 capsule; Refill: 0  3. GAD (generalized anxiety disorder) Continue bupropion 300mg  daily. May take alprazolam 0.5mg  twice daily as needed for acute anxiety. Refills were sent to her pharmacy.  - buPROPion (WELLBUTRIN XL) 150 MG 24 hr tablet; Take 2 tablets by mouth daily  Dispense: 180 tablet; Refill: 1 - ALPRAZolam (XANAX) 0.5 MG tablet; Take 1 tablet (0.5 mg total) by mouth 2 (two) times daily as needed for anxiety.  Dispense: 60 tablet; Refill: 3  General Counseling: Kemba verbalizes understanding of the findings of todays visit and agrees with  plan of treatment. I have discussed any further diagnostic evaluation  that may be needed or ordered today. We also reviewed her medications today. she has been encouraged to call the office with any questions or concerns that should arise related to todays visit.  Refilled Controlled medications today. Reviewed risks and possible side effects associated with taking Stimulants. Combination of these drugs with other psychotropic medications could cause dizziness and drowsiness. Pt needs to Monitor symptoms and exercise caution in driving and operating heavy machinery to avoid damages to oneself, to others and to the surroundings. Patient verbalized understanding in this matter. Dependence and abuse for these drugs will be monitored closely. A Controlled substance policy and procedure is on file which allows [facility] to order a urine drug screen test at any visit. Patient understands and agrees with the plan..  This patient was seen by Leretha Pol FNP Collaboration with Dr Lavera Guise as a part of collaborative care agreement  Meds ordered this encounter  Medications  . buPROPion (WELLBUTRIN XL) 150 MG 24 hr tablet    Sig: Take 2 tablets by mouth daily    Dispense:  180 tablet    Refill:  1    Order Specific Question:   Supervising Provider    Answer:   Lavera Guise [place]  . ALPRAZolam (XANAX) 0.5 MG tablet    Sig: Take 1 tablet (0.5 mg total) by mouth 2 (two) times daily as needed for anxiety.    Dispense:  60 tablet    Refill:  3    Order Specific Question:   Supervising Provider    Answer:   Lavera Guise [[id number]]  . hydrochlorothiazide (HYDRODIURIL) 25 MG tablet    Sig: Take 1 tablet (25 mg total) by mouth daily. Take 1 tablet QAM and 1/2 tablet QPM as needed    Dispense:  45 tablet    Refill:  3    Order Specific Question:   Supervising Provider    Answer:   Lavera Guise [place]  . DISCONTD: lisdexamfetamine (VYVANSE) 40 MG capsule    Sig: Take 1 capsule (40 mg total) by mouth every morning.    Dispense:  30 capsule    Refill:  0     Order Specific Question:   Supervising Provider    Answer:   Lavera Guise [[id number]]  . DISCONTD: lisdexamfetamine (VYVANSE) 40 MG capsule    Sig: Take 1 capsule (40 mg total) by mouth every morning.    Dispense:  30 capsule    Refill:  0    Fill after 05/25/2018    Order Specific Question:   Supervising Provider    Answer:   Lavera Guise [[id number]]  . lisdexamfetamine (VYVANSE) 40 MG capsule    Sig: Take 1 capsule (40 mg total) by mouth every morning.    Dispense:  30 capsule    Refill:  0    Fill after 06/22/2018    Order Specific Question:   Supervising Provider    Answer:   Lavera Guise [[id number]]    Time spent: 25 Minutes      Dr Lavera Guise Internal medicine

## 2018-05-21 ENCOUNTER — Ambulatory Visit (INDEPENDENT_AMBULATORY_CARE_PROVIDER_SITE_OTHER): Payer: Managed Care, Other (non HMO) | Admitting: Adult Health

## 2018-05-21 ENCOUNTER — Encounter: Payer: Self-pay | Admitting: Adult Health

## 2018-05-21 VITALS — BP 128/86 | HR 73 | Temp 97.6°F | Resp 16 | Ht 67.0 in | Wt 166.0 lb

## 2018-05-21 DIAGNOSIS — I10 Essential (primary) hypertension: Secondary | ICD-10-CM | POA: Diagnosis not present

## 2018-05-21 DIAGNOSIS — G43809 Other migraine, not intractable, without status migrainosus: Secondary | ICD-10-CM | POA: Diagnosis not present

## 2018-05-21 DIAGNOSIS — F411 Generalized anxiety disorder: Secondary | ICD-10-CM

## 2018-05-21 MED ORDER — SUMATRIPTAN SUCCINATE 50 MG PO TABS: 50.0000 mg | ORAL_TABLET | ORAL | 0 refills | Status: DC | PRN

## 2018-05-21 MED ORDER — BUTALBITAL-APAP-CAFFEINE 50-325-40 MG PO TABS: 1.0000 | ORAL_TABLET | Freq: Four times a day (QID) | ORAL | 0 refills | Status: AC | PRN

## 2018-05-21 MED ORDER — UBROGEPANT 50 MG PO TABS: 50.0000 mg | ORAL_TABLET | Freq: Every day | ORAL | 0 refills | Status: DC | PRN

## 2018-05-21 NOTE — Progress Notes (Signed)
[facility] [place], [place] [address]  Internal MEDICINE  Office Visit Note  Patient Name: Jillian Edwards  [id number]  [id number]  Date of Service: 05/21/2018  Chief Complaint  Patient presents with  . Headache    three days with headache     HPI Pt is here for a sick visit. Pt reports three days of headache. She reports nausea but denies vomiting, visual changes, fever or body aches.  She has tried taking tylenol and Zicam without relief. She has also tried Ibuprofen with no relief. She reports she has been having an increasing amount of headaches recently.  She has now left work multiple times due to headaches.    Current Medication:  Outpatient Encounter Medications as of 05/21/2018  Medication Sig  . ALPRAZolam (XANAX) 0.5 MG tablet Take 1 tablet (0.5 mg total) by mouth 2 (two) times daily as needed for anxiety.  Marland Kitchen aspirin EC 81 MG tablet Take 81 mg by mouth daily.  Marland Kitchen buPROPion (WELLBUTRIN XL) 150 MG 24 hr tablet Take 2 tablets by mouth daily  . calcium carbonate (TUMS - DOSED IN MG ELEMENTAL CALCIUM) 500 MG chewable tablet Chew 1 tablet by mouth daily.  . cholecalciferol (VITAMIN D) 1000 units tablet Take 1,000 Units by mouth daily.  . hydrochlorothiazide (HYDRODIURIL) 25 MG tablet Take 1 tablet (25 mg total) by mouth daily. Take 1 tablet QAM and 1/2 tablet QPM as needed  . levonorgestrel (MIRENA) 20 MCG/24HR IUD 1 each by Intrauterine route once.  . lisdexamfetamine (VYVANSE) 40 MG capsule Take 1 capsule (40 mg total) by mouth every morning.  . meloxicam (MOBIC) 7.5 MG tablet Take 1 tablet (7.5 mg total) by mouth daily.  . butalbital-acetaminophen-caffeine (FIORICET, ESGIC) 50-325-40 MG tablet Take 1-2 tablets by mouth every 6 (six) hours as needed for headache.  . SUMAtriptan (IMITREX) 50 MG tablet Take 1 tablet (50 mg total) by mouth every 2 (two) hours as needed for migraine. May repeat in 2 hours if headache persists or recurs.  . Ubrogepant  (UBRELVY) 50 MG TABS Take 50 mg by mouth daily as needed.   No facility-administered encounter medications on file as of 05/21/2018.       Medical History: Past Medical History:  Diagnosis Date  . ADD (attention deficit disorder)   . Anxiety   . Depression   . Frequent urination   . Herpes genitalis   . Hypertension   . Stress headaches   . Stroke ([place]) ~2005    loss of peripheral vision  . Stroke or transient ischemic attack (TIA) diagnosed during current admission   . Urological disorder    bladder has dropped     Vital Signs: BP 128/86   Pulse 73   Temp 97.6 F (36.4 C)   Resp 16   Ht 5\' 7"  (1.702 m)   Wt 166 lb (75.3 kg)   SpO2 99%   BMI 26.00 kg/m    Review of Systems  Constitutional: Negative for chills, fatigue and unexpected weight change.  HENT: Negative for congestion, rhinorrhea, sneezing and sore throat.   Eyes: Negative for photophobia, pain and redness.  Respiratory: Negative for cough, chest tightness and shortness of breath.   Cardiovascular: Negative for chest pain and palpitations.  Gastrointestinal: Negative for abdominal pain, constipation, diarrhea, nausea and vomiting.  Endocrine: Negative.   Genitourinary: Negative for dysuria and frequency.  Musculoskeletal: Negative for arthralgias, back pain, joint swelling and neck pain.  Skin: Negative for rash.  Allergic/Immunologic:  Negative.   Neurological: Positive for headaches. Negative for dizziness, tremors, seizures, syncope, speech difficulty and numbness.  Hematological: Negative for adenopathy. Does not bruise/bleed easily.  Psychiatric/Behavioral: Negative for behavioral problems and sleep disturbance. The patient is not nervous/anxious.     Physical Exam Vitals signs and nursing note reviewed.  Constitutional:      General: She is not in acute distress.    Appearance: She is well-developed. She is not diaphoretic.  HENT:     Head: Normocephalic and atraumatic.     Mouth/Throat:      Pharynx: No oropharyngeal exudate.  Eyes:     Pupils: Pupils are equal, round, and reactive to light.  Neck:     Musculoskeletal: Normal range of motion and neck supple.     Thyroid: No thyromegaly.     Vascular: No JVD.     Trachea: No tracheal deviation.  Cardiovascular:     Rate and Rhythm: Normal rate and regular rhythm.     Heart sounds: Normal heart sounds. No murmur. No friction rub. No gallop.   Pulmonary:     Effort: Pulmonary effort is normal. No respiratory distress.     Breath sounds: Normal breath sounds. No wheezing or rales.  Chest:     Chest wall: No tenderness.  Abdominal:     Palpations: Abdomen is soft.     Tenderness: There is no abdominal tenderness. There is no guarding.  Musculoskeletal: Normal range of motion.  Lymphadenopathy:     Cervical: No cervical adenopathy.  Skin:    General: Skin is warm and dry.  Neurological:     Mental Status: She is alert and oriented to person, place, and time.     Cranial Nerves: No cranial nerve deficit.  Psychiatric:        Behavior: Behavior normal.        Thought Content: Thought content normal.        Judgment: Judgment normal.    Assessment/Plan: 1. Other migraine without status migrainosus, not intractable Discussed with patient that she could try Ubrelvy for abortive therapy for migraine.  If she is unable to obtain this medication, or it is unsuccessful I have also provided a prescription for Fioricet and Imitrex for future issues.  Instructed patient to return to clinic if she gets no relief with these medications. - Ubrogepant (UBRELVY) 50 MG TABS; Take 50 mg by mouth daily as needed.  Dispense: 10 tablet; Refill: 0 - SUMAtriptan (IMITREX) 50 MG tablet; Take 1 tablet (50 mg total) by mouth every 2 (two) hours as needed for migraine. May repeat in 2 hours if headache persists or recurs.  Dispense: 10 tablet; Refill: 0 - butalbital-acetaminophen-caffeine (FIORICET, ESGIC) 50-325-40 MG tablet; Take 1-2 tablets by  mouth every 6 (six) hours as needed for headache.  Dispense: 20 tablet; Refill: 0  2. Essential hypertension Patient's blood pressure 128/86 today.  Continue taking medications as prescribed.  3. GAD (generalized anxiety disorder) Patient appears slightly anxious however states that she is annoyed by the fact that her head has really not stopped hurting for 3 days.  General Counseling: Angelamarie verbalizes understanding of the findings of todays visit and agrees with plan of treatment. I have discussed any further diagnostic evaluation that may be needed or ordered today. We also reviewed her medications today. she has been encouraged to call the office with any questions or concerns that should arise related to todays visit.   No orders of the defined types were placed in this encounter.  Meds ordered this encounter  Medications  . Ubrogepant (UBRELVY) 50 MG TABS    Sig: Take 50 mg by mouth daily as needed.    Dispense:  10 tablet    Refill:  0    [id number]  GRP [id number] OJ#[id number]  . SUMAtriptan (IMITREX) 50 MG tablet    Sig: Take 1 tablet (50 mg total) by mouth every 2 (two) hours as needed for migraine. May repeat in 2 hours if headache persists or recurs.    Dispense:  10 tablet    Refill:  0  . butalbital-acetaminophen-caffeine (FIORICET, ESGIC) 50-325-40 MG tablet    Sig: Take 1-2 tablets by mouth every 6 (six) hours as needed for headache.    Dispense:  20 tablet    Refill:  0    Time spent: 25 Minutes  This patient was seen by Orson Gear AGNP-C in Collaboration with Dr Lavera Guise as a part of collaborative care agreement.  Kendell Bane AGNP-C Internal Medicine

## 2018-05-29 ENCOUNTER — Ambulatory Visit (INDEPENDENT_AMBULATORY_CARE_PROVIDER_SITE_OTHER): Payer: BLUE CROSS/BLUE SHIELD | Admitting: Certified Nurse Midwife

## 2018-05-29 ENCOUNTER — Other Ambulatory Visit (HOSPITAL_COMMUNITY)
Admission: RE | Admit: 2018-05-29 | Discharge: 2018-05-29 | Disposition: A | Discharge status: Home / Self Care | Payer: BLUE CROSS/BLUE SHIELD | Source: Ambulatory Visit | Attending: Certified Nurse Midwife | Admitting: Certified Nurse Midwife

## 2018-05-29 ENCOUNTER — Encounter: Payer: Self-pay | Admitting: Certified Nurse Midwife

## 2018-05-29 VITALS — BP 146/82 | HR 79 | Ht 67.0 in | Wt 166.0 lb

## 2018-05-29 DIAGNOSIS — N841 Polyp of cervix uteri: Secondary | ICD-10-CM | POA: Diagnosis not present

## 2018-05-29 DIAGNOSIS — Z124 Encounter for screening for malignant neoplasm of cervix: Secondary | ICD-10-CM | POA: Insufficient documentation

## 2018-05-29 DIAGNOSIS — Z01419 Encounter for gynecological examination (general) (routine) without abnormal findings: Secondary | ICD-10-CM | POA: Insufficient documentation

## 2018-05-29 DIAGNOSIS — N951 Menopausal and female climacteric states: Secondary | ICD-10-CM | POA: Diagnosis not present

## 2018-05-29 DIAGNOSIS — Z1239 Encounter for other screening for malignant neoplasm of breast: Secondary | ICD-10-CM

## 2018-05-29 NOTE — Progress Notes (Signed)
Gynecology Annual Exam  PCP: Ronnell Freshwater, NP  Chief Complaint:  Chief Complaint  Patient presents with  . Gynecologic Exam    Had periods the last two months but had not had one in a year    History of Present Illness:Jillian Edwards is a 53 year old Caucasian/White female, G1 P1001, who presents for her annual exam.     She has had no menses for a year with her Mirena IUD then had monthly menses x 2 months, lasting 5-6 days. The bleeding was light to medium and was not accompanied by cramping. Has had more frontal headaches. Hx of migraines. Takes sumatriptan with relief. Would like a refill of sumatriptan. The patient's past medical history is notable for a history of a stroke in 2006, depression/anxiety, hypertension, a remote hx of CIN I, and HSVII. She uses Valtrex episodically, has not needed in a while.  Since her last annual GYN exam dated 11/10/2016 , she had a colonoscopy 12/14/2016 which was normal. She is sexually active. She is currently using an IUD for contraception. Mirena IUD was placed on 04/30/2012.  Her most recent pap smear was obtained 11/10/2016 and was negative.  Her most recent mammogram on file was 06/09/2016 and was normal. Has her mammograms at [facility]. She thinks that she had a mammogram 2019, but results not available. Was seen by PA Copland January 2020 for a possible mass in her left breast. This mass was not palpated on her office exam, but mammogram was ordered. She was unale to palpate the breast mass after a couple of days and did not schedule the mammogram.  Hx of breast augmentation in 2005 and a stereotactic biopsy of calcifications in 01/2013 which was benign.  There is a positive history of breast cancer in her maternal grandmother. Genetic testing has not been done.  There is no family history of ovarian cancer.  The patient does  do monthly self breast exams.  The patient does not smoke.  The patient does not drink alcohol.  The patient  does not use illegal drugs.  The patient exercises regularly at [employer] classes The patient does get adequate calcium in her diet and with her Tums supplement  She had a normal lipid panel in 2019 at her PCP Dr Chancy Milroy N[facility].   Review of Systems: Review of Systems  Constitutional: Negative for chills, fever and weight loss.  HENT: Negative for congestion, sinus pain and sore throat.   Eyes: Negative for blurred vision and pain.  Respiratory: Negative for hemoptysis, shortness of breath and wheezing.   Cardiovascular: Negative for chest pain, palpitations and leg swelling.  Gastrointestinal: Positive for heartburn. Negative for abdominal pain, blood in stool, constipation, diarrhea, nausea and vomiting.  Genitourinary: Negative for dysuria, frequency, hematuria and urgency.       Positive for urinary incontinence.and dyspareunia  Musculoskeletal: Negative for back pain, joint pain and myalgias (left hip pain).  Skin: Negative for itching and rash.  Neurological: Positive for headaches. Negative for dizziness and tingling.  Endo/Heme/Allergies: Negative for environmental allergies and polydipsia. Bruises/bleeds easily.       Positive for hot and cold intolerance and decreased sex drive.   Psychiatric/Behavioral: Positive for depression. The patient is nervous/anxious and has insomnia.     Past Medical History:  Past Medical History:  Diagnosis Date  . ADD (attention deficit disorder)   . Anxiety   . Depression   . Frequent urination   . Headache   .  Herpes genitalis   . Hypertension   . Stress headaches   . Stroke ([place]) ~2005    loss of peripheral vision  . Stroke or transient ischemic attack (TIA) diagnosed during current admission   . Urological disorder    bladder has dropped    Past Surgical History:  Past Surgical History:  Procedure Laterality Date  . BREAST BIOPSY Left 02/06/2013   stereotactic biopsy of calcifications-benign  . BREAST ENHANCEMENT SURGERY   08/2003  . COLONOSCOPY WITH PROPOFOL N/A 12/14/2016   Procedure: COLONOSCOPY WITH PROPOFOL;  Surgeon: Jonathon Bellows, MD;  Location: [facility] ENDOSCOPY;  Service: Gastroenterology;  Laterality: N/A;  . COLPOSCOPY  03/1998; 07/1999   2000-no lesion; 2001-CIN1  . ENDOMETRIAL BIOPSY  03/2007   proliferative    Family History:  Family History  Problem Relation Age of Onset  . Hypertension Father   . Diabetes Father        Has also had an amputation  . Heart attack Father   . Diabetes Mellitus II Father   . Diabetes Mellitus II Brother   . Hypertension Brother   . Breast cancer Maternal Grandmother 60  . Diabetes Mellitus II Paternal Grandmother     Social History:  Social History   Socioeconomic History  . Marital status: Legally Separated    Spouse name: Not on file  . Number of children: 1  . Years of education: 65  . Highest education level: Not on file  Occupational History  . Occupation: Corporate treasurer  Social Needs  . Financial resource strain: Not on file  . Food insecurity:    Worry: Not on file    Inability: Not on file  . Transportation needs:    Medical: Not on file    Non-medical: Not on file  Tobacco Use  . Smoking status: Never Smoker  . Smokeless tobacco: Never Used  Substance and Sexual Activity  . Alcohol use: No    Alcohol/week: 0.0 standard drinks  . Drug use: No  . Sexual activity: Yes    Birth control/protection: I.U.D.    Comment: Mirena  Lifestyle  . Physical activity:    Days per week: Not on file    Minutes per session: Not on file  . Stress: Not on file  Relationships  . Social connections:    Talks on phone: Not on file    Gets together: Not on file    Attends religious service: Not on file    Active member of club or organization: Not on file    Attends meetings of clubs or organizations: Not on file    Relationship status: Not on file  . Intimate partner violence:    Fear of current or ex partner: Not on file    Emotionally  abused: Not on file    Physically abused: Not on file    Forced sexual activity: Not on file  Other Topics Concern  . Not on file  Social History Narrative  . Not on file    Allergies:  No Known Allergies  Medications: Current Outpatient Medications:  .  ALPRAZolam (XANAX) 0.5 MG tablet, Take 1 tablet (0.5 mg total) by mouth 2 (two) times daily as needed for anxiety., Disp: 60 tablet, Rfl: 3 .  aspirin EC 81 MG tablet, Take 81 mg by mouth daily., Disp: , Rfl:  .  buPROPion (WELLBUTRIN XL) 150 MG 24 hr tablet, Take 2 tablets by mouth daily, Disp: 180 tablet, Rfl: 1 .  butalbital-acetaminophen-caffeine (FIORICET, ESGIC) 50-325-40 MG  tablet, Take 1-2 tablets by mouth every 6 (six) hours as needed for headache., Disp: 20 tablet, Rfl: 0 .  calcium carbonate (TUMS - DOSED IN MG ELEMENTAL CALCIUM) 500 MG chewable tablet, Chew 1 tablet by mouth daily., Disp: , Rfl:  .  cholecalciferol (VITAMIN D) 1000 units tablet, Take 2,000 Units by mouth daily. , Disp: , Rfl:  .  doxycycline (PERIOSTAT) 20 MG tablet, , Disp: , Rfl:  .  hydrochlorothiazide (HYDRODIURIL) 25 MG tablet, Take 1 tablet (25 mg total) by mouth daily. Take 1 tablet QAM and 1/2 tablet QPM as needed, Disp: 45 tablet, Rfl: 3 .  Ivermectin 1 % CREA, , Disp: , Rfl:  .  levonorgestrel (MIRENA) 20 MCG/24HR IUD, 1 each by Intrauterine route once., Disp: , Rfl:  .  lisdexamfetamine (VYVANSE) 40 MG capsule, Take 1 capsule (40 mg total) by mouth every morning., Disp: 30 capsule, Rfl: 0 .  meloxicam (MOBIC) 7.5 MG tablet, Take 1 tablet (7.5 mg total) by mouth daily., Disp: 30 tablet, Rfl: 1 .  montelukast (SINGULAIR) 10 MG tablet, Take 10 mg by mouth every evening., Disp: , Rfl:  .  SUMAtriptan (IMITREX) 50 MG tablet, Take 50 mg by mouth every 2 (two) hours as needed for migraine. May repeat in 2 hours if headache persists or recurs., Disp: , Rfl:   Physical Exam Vitals: BP (!) 146/82   Pulse 79   Ht 5\' 7"  (1.702 m)   Wt 166 lb (75.3 kg)    LMP 05/06/2018 (Approximate)   BMI 26.00 kg/m   General: WF in NAD HEENT: normocephalic, anicteric Neck: no thyroid enlargement, no palpable nodules, no cervical lymphadenopathy  Pulmonary: No increased work of breathing, CTAB Cardiovascular: RRR, without murmur  Breast: Breast symmetrical, no tenderness, no palpable nodules or masses, no skin or nipple retraction present, no nipple discharge.  No axillary, infraclavicular or supraclavicular lymphadenopathy. Abdomen: Soft, non-tender, non-distended.  Umbilicus without lesions.  No hepatomegaly or masses palpable. No evidence of hernia. Genitourinary:  External: Normal external female genitalia.  Normal urethral meatus, normal Bartholin's and Skene's glands.    Vagina: Normal vaginal mucosa, white mucoepithelial discharge   Cervix:: small polyp at os, no bleeding, non-tender, IUD string present. Polyp removed  Uterus: Anteverted, normal size, shape, and consistency, mobile, and non-tender  Adnexa: No adnexal masses, non-tender  Rectal: deferred  Lymphatic: no evidence of inguinal lymphadenopathy Extremities: no edema, erythema, or tenderness Neurologic: Grossly intact Psychiatric: mood appropriate, affect full.   Assessment: 53 y.o. annual gyn exam Perimenopausal symptoms  Plan:    1) Breast cancer screening - recommend monthly self breast exam and annual mammograms. Patient to schedule mammogram at [facility]  2) Colon cancer screen- normal colonoscopy 2018. Next due 2028  3) Cervical cancer screening - Pap was done. ASCCP guidelines and rational discussed.  Patient opts for yearly screening interval. Endocervical polyp sent for pathology  4) Contraception -Mirena in place x 6 years..  FSH, LH today and if elevated will leave Mirena in for another year. If normal, discuss replacing Mirena vs use of another form of birth control  5) Routine healthcare maintenance including cholesterol and diabetes screening managed by PCP   6)  Discussed options for treatment of decreased sex drive/dyspareunia/ vasomotor smptoms. Explained how to do Kegel exercises for SUI  7) RTO 1 year and prn.  Dalia Heading, CNM

## 2018-05-30 LAB — FSH/LH
FSH: 59.9 m[IU]/mL
LH: 32.2 m[IU]/mL

## 2018-05-30 LAB — CYTOLOGY - PAP
Diagnosis: NEGATIVE
HPV: NOT DETECTED

## 2018-05-31 ENCOUNTER — Encounter: Payer: Self-pay | Admitting: Nurse Practitioner

## 2018-05-31 ENCOUNTER — Ambulatory Visit (INDEPENDENT_AMBULATORY_CARE_PROVIDER_SITE_OTHER): Payer: Managed Care, Other (non HMO) | Admitting: Nurse Practitioner

## 2018-05-31 VITALS — BP 130/90 | HR 78 | Resp 16 | Ht 67.0 in | Wt 165.6 lb

## 2018-05-31 DIAGNOSIS — I10 Essential (primary) hypertension: Secondary | ICD-10-CM | POA: Diagnosis not present

## 2018-05-31 DIAGNOSIS — Z8673 Personal history of transient ischemic attack (TIA), and cerebral infarction without residual deficits: Secondary | ICD-10-CM

## 2018-05-31 DIAGNOSIS — G4452 New daily persistent headache (NDPH): Secondary | ICD-10-CM

## 2018-05-31 MED ORDER — PROPRANOLOL HCL ER 60 MG PO CP24: 60.0000 mg | ORAL_CAPSULE | Freq: Every day | ORAL | 3 refills | Status: DC

## 2018-05-31 NOTE — Progress Notes (Signed)
[facility] [place], [place] [address]  Internal MEDICINE  Office Visit Note  Patient Name: Jillian Edwards  [id number]  [id number]  Date of Service: 06/03/2018   Pt is here for a sick visit.  Chief Complaint  Patient presents with  . Migraine    Pt Bp was high146/80 and 170/82     Pt reports three days of headache. She reports nausea but denies vomiting, visual changes, fever or body aches.  She has tried taking tylenol and Zicam without relief. She has also tried Ibuprofen with no relief. She reports she has been having an increasing amount of headaches recently.  She has now left work multiple times due to headaches. She was seen 05/21/2018 for same symptoms. Was given prescriptions for imitrex, ubrelvy, and fioricet. She has taken multiple does of these medications everyday since she was last seen. She states that they do improve the pain for a short period of time, but headache just comes right back. 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 She is afraid, with new onset of headaches and higher blood pressure, she is having a similar situation.          Current Medication:  Outpatient Encounter Medications as of 05/31/2018  Medication Sig  . ALPRAZolam (XANAX) 0.5 MG tablet Take 1 tablet (0.5 mg total) by mouth 2 (two) times daily as needed for anxiety.  Marland Kitchen aspirin EC 81 MG tablet Take 81 mg by mouth daily.  Marland Kitchen buPROPion (WELLBUTRIN XL) 150 MG 24 hr tablet Take 2 tablets by mouth daily  . butalbital-acetaminophen-caffeine (FIORICET, ESGIC) 50-325-40 MG tablet Take 1-2 tablets by mouth every 6 (six) hours as needed for headache.  . calcium carbonate (TUMS - DOSED IN MG ELEMENTAL CALCIUM) 500 MG chewable tablet Chew 1 tablet by mouth daily.  . cholecalciferol (VITAMIN D) 1000 units tablet Take 1,000 Units by mouth daily.  Marland Kitchen doxycycline (PERIOSTAT) 20 MG tablet   . hydrochlorothiazide (HYDRODIURIL) 25  MG tablet Take 1 tablet (25 mg total) by mouth daily. Take 1 tablet QAM and 1/2 tablet QPM as needed  . Ivermectin 1 % CREA   . levonorgestrel (MIRENA) 20 MCG/24HR IUD 1 each by Intrauterine route once.  . lisdexamfetamine (VYVANSE) 40 MG capsule Take 1 capsule (40 mg total) by mouth every morning.  . meloxicam (MOBIC) 7.5 MG tablet Take 1 tablet (7.5 mg total) by mouth daily.  . montelukast (SINGULAIR) 10 MG tablet Take 10 mg by mouth every evening.  . SUMAtriptan (IMITREX) 50 MG tablet Take 50 mg by mouth every 2 (two) hours as needed for migraine. May repeat in 2 hours if headache persists or recurs.  . propranolol ER (INDERAL LA) 60 MG 24 hr capsule Take 1 capsule (60 mg total) by mouth daily.   No facility-administered encounter medications on file as of 05/31/2018.       Medical History: Past Medical History:  Diagnosis Date  . ADD (attention deficit disorder)   . Anxiety   . Depression   . Frequent urination   . Headache   . Herpes genitalis   . Hypertension   . Stress headaches   . Stroke ([place]) ~2005    loss of peripheral vision  . Stroke or transient ischemic attack (TIA) diagnosed during current admission   . Urological disorder    bladder has dropped     Vital Signs: BP 130/90   Pulse 78  Resp 16   Ht 5\' 7"  (1.702 m)   Wt 165 lb 9.6 oz (75.1 kg)   LMP 05/06/2018 (Approximate)   SpO2 98%   BMI 25.94 kg/m    Review of Systems  Constitutional: Positive for activity change and fatigue. Negative for chills and unexpected weight change.  HENT: Negative for congestion, rhinorrhea, sneezing and sore throat.   Respiratory: Negative for cough, chest tightness and shortness of breath.   Cardiovascular: Negative for chest pain and palpitations.       Blood pressures elevated.   Gastrointestinal: Positive for nausea and vomiting. Negative for abdominal pain, constipation and diarrhea.  Musculoskeletal: Positive for neck pain and neck stiffness. Negative for  arthralgias, back pain and joint swelling.  Skin: Negative for rash.  Neurological: Positive for headaches. Negative for dizziness, tremors, seizures, syncope, speech difficulty and numbness.  Hematological: Negative for adenopathy. Does not bruise/bleed easily.  Psychiatric/Behavioral: Negative for behavioral problems and sleep disturbance. The patient is nervous/anxious.     Physical Exam Vitals signs and nursing note reviewed.  Constitutional:      General: She is in acute distress.     Appearance: Normal appearance. She is well-developed. She is not diaphoretic.  HENT:     Head: Normocephalic and atraumatic.     Mouth/Throat:     Pharynx: No oropharyngeal exudate.  Eyes:     Conjunctiva/sclera: Conjunctivae normal.     Pupils: Pupils are equal, round, and reactive to light.  Neck:     Musculoskeletal: Normal range of motion and neck supple.     Thyroid: No thyromegaly.     Vascular: No carotid bruit or JVD.     Trachea: No tracheal deviation.  Cardiovascular:     Rate and Rhythm: Normal rate and regular rhythm.     Heart sounds: Normal heart sounds. No murmur. No friction rub. No gallop.   Pulmonary:     Effort: Pulmonary effort is normal. No respiratory distress.     Breath sounds: Normal breath sounds. No wheezing or rales.  Chest:     Chest wall: No tenderness.  Abdominal:     General: Bowel sounds are normal.     Palpations: Abdomen is soft.     Tenderness: There is no abdominal tenderness. There is no guarding.  Musculoskeletal: Normal range of motion.  Lymphadenopathy:     Cervical: No cervical adenopathy.  Skin:    General: Skin is warm and dry.  Neurological:     General: No focal deficit present.     Mental Status: She is alert and oriented to person, place, and time.     Cranial Nerves: No cranial nerve deficit.     Comments: Mini neuro exam done in the office. Pupils are equal, round, and reactive to light and accomodation. Cranial nerves intact.  Extremities are strong and equal, bilaterally.   Psychiatric:        Behavior: Behavior normal.        Thought Content: Thought content normal.        Judgment: Judgment normal.   Assessment/Plan: 1. New daily persistent headache Continue to use previously prescribed medications to treat acute headache. Will get head CT and CTA neck for further evaluation.  - CT Head Wo Contrast; Future - CT ANGIO NECK W OR WO CONTRAST; Future  2. History of stroke Will get head CT and CTA neck for further evaluation.  - CT Head Wo Contrast; Future - CT ANGIO NECK W OR WO CONTRAST; Future  3.  Essential hypertension Add propraolol ER 60mg  at bedtime to help lower pressure. Should also decrease frequency and severity of headaches.  - propranolol ER (INDERAL LA) 60 MG 24 hr capsule; Take 1 capsule (60 mg total) by mouth daily.  Dispense: 30 capsule; Refill: 3  General Counseling: [place] verbalizes understanding of the findings of todays visit and agrees with plan of treatment. I have discussed any further diagnostic evaluation that may be needed or ordered today. We also reviewed her medications today. she has been encouraged to call the office with any questions or concerns that should arise related to todays visit.    Counseling:  Hypertension Counseling:   The following hypertensive lifestyle modification were recommended and discussed:  1. Limiting alcohol intake to less than 1 oz/day of ethanol:(24 oz of beer or 8 oz of wine or 2 oz of 100-proof whiskey). 2. Take baby ASA 81 mg daily. 3. Importance of regular aerobic exercise and losing weight. 4. Reduce dietar saturated fat and cholesterol intake for overall cardiovascular health.  5. Maintaining adequate dietary potassium, calcium, and magnesium intake. 6. Regular monitoring of the blood pressure. 7. Reduce sodium intake to less than 100 mmol/day (less than 2.3 gm of sodium or less than 6 gm of sodium choride)   This patient was seen by  [place] with Dr Lavera Guise as a part of collaborative care agreement  Orders Placed This Encounter  Procedures  . CT Head Wo Contrast  . CT ANGIO NECK W OR WO CONTRAST    Meds ordered this encounter  Medications  . propranolol ER (INDERAL LA) 60 MG 24 hr capsule    Sig: Take 1 capsule (60 mg total) by mouth daily.    Dispense:  30 capsule    Refill:  3    Order Specific Question:   Supervising Provider    Answer:   Lavera Guise [[id number]]    Time spent: 30 Minutes

## 2018-06-03 DIAGNOSIS — G43909 Migraine, unspecified, not intractable, without status migrainosus: Secondary | ICD-10-CM | POA: Insufficient documentation

## 2018-06-03 DIAGNOSIS — Z8673 Personal history of transient ischemic attack (TIA), and cerebral infarction without residual deficits: Secondary | ICD-10-CM | POA: Insufficient documentation

## 2018-06-03 DIAGNOSIS — G4452 New daily persistent headache (NDPH): Secondary | ICD-10-CM | POA: Insufficient documentation

## 2018-06-06 DIAGNOSIS — H1011 Acute atopic conjunctivitis, right eye: Secondary | ICD-10-CM | POA: Diagnosis not present

## 2018-06-13 ENCOUNTER — Other Ambulatory Visit: Payer: Self-pay

## 2018-06-13 ENCOUNTER — Telehealth: Payer: Self-pay | Admitting: Nurse Practitioner

## 2018-06-13 MED ORDER — SUMATRIPTAN SUCCINATE 50 MG PO TABS: 50.0000 mg | ORAL_TABLET | ORAL | 2 refills | Status: DC | PRN

## 2018-06-13 NOTE — Telephone Encounter (Signed)
Spoke with pt that she is feeling much better she can wait Ct scan for now and we send imitrex as per [employer]

## 2018-06-14 ENCOUNTER — Ambulatory Visit: Admission: RE | Admit: 2018-06-14 | Payer: BLUE CROSS/BLUE SHIELD | Source: Ambulatory Visit

## 2018-06-17 ENCOUNTER — Ambulatory Visit (INDEPENDENT_AMBULATORY_CARE_PROVIDER_SITE_OTHER): Payer: Managed Care, Other (non HMO) | Admitting: Nurse Practitioner

## 2018-06-17 ENCOUNTER — Other Ambulatory Visit: Payer: Self-pay

## 2018-06-17 ENCOUNTER — Encounter: Payer: Self-pay | Admitting: Nurse Practitioner

## 2018-06-17 VITALS — BP 138/82 | HR 77 | Resp 16 | Ht 67.0 in | Wt 168.0 lb

## 2018-06-17 DIAGNOSIS — J324 Chronic pansinusitis: Secondary | ICD-10-CM | POA: Diagnosis not present

## 2018-06-17 DIAGNOSIS — I10 Essential (primary) hypertension: Secondary | ICD-10-CM | POA: Diagnosis not present

## 2018-06-17 DIAGNOSIS — Z8673 Personal history of transient ischemic attack (TIA), and cerebral infarction without residual deficits: Secondary | ICD-10-CM

## 2018-06-17 DIAGNOSIS — G4452 New daily persistent headache (NDPH): Secondary | ICD-10-CM

## 2018-06-17 NOTE — Progress Notes (Signed)
Pt blood pressure elevated, taken twice 1st reading 142/83 machine 2nd reading manually 146/84  Informed provider.

## 2018-06-17 NOTE — Progress Notes (Signed)
[facility] [place], [place] [address]  Internal MEDICINE  Office Visit Note  Patient Name: Jillian Edwards  [id number]  [id number]  Date of Service: 06/29/2018  Chief Complaint  Patient presents with  . Medical Management of Chronic Issues    2wk follow up  . Sinusitis    pt states she is havigng drainage and feel it in her throat , she also is having popping in her ears, no fever or headaches  . Hypertension    Pt reports three days of headache. She reports nausea but denies vomiting, visual changes, fever or body aches.  She has tried taking tylenol and Zicam without relief. She has also tried Ibuprofen with no relief. She reports she has been having an increasing amount of headaches recently.  She has now left work multiple times due to headaches.  Was given prescriptions for imitrex, ubrelvy, and fioricet. Was started on propranolol ER 60mg  at her most recent visit. States that she has not had a headache since starting this medication. Blood pressure is slightly elevated today.        Current Medication: Outpatient Encounter Medications as of 06/17/2018  Medication Sig  . ALPRAZolam (XANAX) 0.5 MG tablet Take 1 tablet (0.5 mg total) by mouth 2 (two) times daily as needed for anxiety.  Marland Kitchen aspirin EC 81 MG tablet Take 81 mg by mouth daily.  Marland Kitchen buPROPion (WELLBUTRIN XL) 150 MG 24 hr tablet Take 2 tablets by mouth daily  . butalbital-acetaminophen-caffeine (FIORICET, ESGIC) 50-325-40 MG tablet Take 1-2 tablets by mouth every 6 (six) hours as needed for headache.  . calcium carbonate (TUMS - DOSED IN MG ELEMENTAL CALCIUM) 500 MG chewable tablet Chew 1 tablet by mouth daily.  . cholecalciferol (VITAMIN D) 1000 units tablet Take 2,000 Units by mouth daily.   Marland Kitchen doxycycline (PERIOSTAT) 20 MG tablet   . hydrochlorothiazide (HYDRODIURIL) 25 MG tablet Take 1 tablet (25 mg total) by mouth daily. Take 1 tablet QAM and 1/2 tablet QPM as needed  . Ivermectin 1 % CREA    . levonorgestrel (MIRENA) 20 MCG/24HR IUD 1 each by Intrauterine route once.  . lisdexamfetamine (VYVANSE) 40 MG capsule Take 1 capsule (40 mg total) by mouth every morning.  . meloxicam (MOBIC) 7.5 MG tablet Take 1 tablet (7.5 mg total) by mouth daily.  . montelukast (SINGULAIR) 10 MG tablet Take 10 mg by mouth every evening.  . propranolol ER (INDERAL LA) 60 MG 24 hr capsule Take 1 capsule (60 mg total) by mouth daily.  . [DISCONTINUED] SUMAtriptan (IMITREX) 50 MG tablet Take 1 tablet (50 mg total) by mouth every 2 (two) hours as needed for migraine. May repeat in 2 hours if headache persists or recurs.   No facility-administered encounter medications on file as of 06/17/2018.     Surgical History: Past Surgical History:  Procedure Laterality Date  . BREAST BIOPSY Left 02/06/2013   stereotactic biopsy of calcifications-benign  . BREAST ENHANCEMENT SURGERY  08/2003  . COLONOSCOPY WITH PROPOFOL N/A 12/14/2016   Procedure: COLONOSCOPY WITH PROPOFOL;  Surgeon: Jonathon Bellows, MD;  Location: [facility] ENDOSCOPY;  Service: Gastroenterology;  Laterality: N/A;  . COLPOSCOPY  03/1998; 07/1999   2000-no lesion; 2001-CIN1  . ENDOMETRIAL BIOPSY  03/2007   proliferative    Medical History: Past Medical History:  Diagnosis Date  . ADD (attention deficit disorder)   . Anxiety   . Depression   . Frequent urination   . Headache   . Herpes genitalis   .  Hypertension   . Stress headaches   . Stroke ([place]) ~2005    loss of peripheral vision  . Stroke or transient ischemic attack (TIA) diagnosed during current admission   . Urological disorder    bladder has dropped    Family History: Family History  Problem Relation Age of Onset  . Hypertension Father   . Diabetes Father        Has also had an amputation  . Heart attack Father   . Diabetes Mellitus II Father   . Diabetes Mellitus II Brother   . Hypertension Brother   . Breast cancer Maternal Grandmother 60  . Diabetes Mellitus II Paternal  Grandmother     Social History   Socioeconomic History  . Marital status: Legally Separated    Spouse name: Not on file  . Number of children: 1  . Years of education: 4  . Highest education level: Not on file  Occupational History  . Occupation: Corporate treasurer  Social Needs  . Financial resource strain: Not on file  . Food insecurity:    Worry: Not on file    Inability: Not on file  . Transportation needs:    Medical: Not on file    Non-medical: Not on file  Tobacco Use  . Smoking status: Never Smoker  . Smokeless tobacco: Never Used  Substance and Sexual Activity  . Alcohol use: No    Alcohol/week: 0.0 standard drinks  . Drug use: No  . Sexual activity: Yes    Birth control/protection: I.U.D.    Comment: Mirena  Lifestyle  . Physical activity:    Days per week: Not on file    Minutes per session: Not on file  . Stress: Not on file  Relationships  . Social connections:    Talks on phone: Not on file    Gets together: Not on file    Attends religious service: Not on file    Active member of club or organization: Not on file    Attends meetings of clubs or organizations: Not on file    Relationship status: Not on file  . Intimate partner violence:    Fear of current or ex partner: Not on file    Emotionally abused: Not on file    Physically abused: Not on file    Forced sexual activity: Not on file  Other Topics Concern  . Not on file  Social History Narrative  . Not on file      Review of Systems  Constitutional: Positive for fatigue. Negative for activity change, chills and unexpected weight change.  HENT: Positive for ear pain and sinus pressure. Negative for congestion, rhinorrhea, sneezing and sore throat.   Respiratory: Negative for cough, chest tightness, shortness of breath and wheezing.   Cardiovascular: Negative for chest pain and palpitations.       Blood pressures improved today.  Gastrointestinal: Positive for nausea and vomiting.  Negative for abdominal pain, constipation and diarrhea.  Musculoskeletal: Positive for neck pain and neck stiffness. Negative for arthralgias, back pain and joint swelling.  Skin: Negative for rash.  Allergic/Immunologic: Positive for environmental allergies.  Neurological: Negative for dizziness, tremors, seizures, syncope, speech difficulty, numbness and headaches.       Patient states she has not had a headache since starting propranolol at her last visit.   Hematological: Negative for adenopathy. Does not bruise/bleed easily.  Psychiatric/Behavioral: Negative for behavioral problems and sleep disturbance. The patient is nervous/anxious.     Today's Vitals  06/17/18 1155  BP: 138/82  Pulse: 77  Resp: 16  SpO2: 99%  Weight: 168 lb (76.2 kg)  Height: 5\' 7"  (1.702 m)   Body mass index is 26.31 kg/m.  Physical Exam Vitals signs and nursing note reviewed.  Constitutional:      Appearance: Normal appearance. She is well-developed. She is not diaphoretic.  HENT:     Head: Normocephalic and atraumatic.     Right Ear: Tympanic membrane is bulging.     Left Ear: Tympanic membrane is bulging.     Nose:     Right Sinus: Frontal sinus tenderness present.     Left Sinus: Frontal sinus tenderness present.     Mouth/Throat:     Pharynx: Posterior oropharyngeal erythema present. No oropharyngeal exudate.  Eyes:     Conjunctiva/sclera: Conjunctivae normal.     Pupils: Pupils are equal, round, and reactive to light.  Neck:     Musculoskeletal: Normal range of motion and neck supple.     Thyroid: No thyromegaly.     Vascular: No carotid bruit or JVD.     Trachea: No tracheal deviation.  Cardiovascular:     Rate and Rhythm: Normal rate and regular rhythm.     Heart sounds: Normal heart sounds. No murmur. No friction rub. No gallop.   Pulmonary:     Effort: Pulmonary effort is normal. No respiratory distress.     Breath sounds: Normal breath sounds. No wheezing or rales.  Chest:      Chest wall: No tenderness.  Abdominal:     General: Bowel sounds are normal.     Palpations: Abdomen is soft.     Tenderness: There is no abdominal tenderness. There is no guarding.  Musculoskeletal: Normal range of motion.  Lymphadenopathy:     Cervical: No cervical adenopathy.  Skin:    General: Skin is warm and dry.  Neurological:     General: No focal deficit present.     Mental Status: She is alert and oriented to person, place, and time.     Cranial Nerves: No cranial nerve deficit.     Comments:    Psychiatric:        Behavior: Behavior normal.        Thought Content: Thought content normal.        Judgment: Judgment normal.   Assessment/Plan: 1. Essential hypertension Improved. Continue bp medication as prescribed.   2. New daily persistent headache Improved. Continue preventive medication daily. Use abortive therapy as needed and as prescribed. Awaiting brain CT which has been postponed due to fear of Novel Coronavirus.   3. History of stroke Awaiting brain CT which has been postponed due to fear of Novel Coronavirus.   4. Chronic pansinusitis Currently no evidence of acute infection. Take OTC allergy medication daily. Will begin antibiotic therapy as indicated.   General Counseling: Mellody verbalizes understanding of the findings of todays visit and agrees with plan of treatment. I have discussed any further diagnostic evaluation that may be needed or ordered today. We also reviewed her medications today. she has been encouraged to call the office with any questions or concerns that should arise related to todays visit.   Hypertension Counseling:   The following hypertensive lifestyle modification were recommended and discussed:  1. Limiting alcohol intake to less than 1 oz/day of ethanol:(24 oz of beer or 8 oz of wine or 2 oz of 100-proof whiskey). 2. Take baby ASA 81 mg daily. 3. Importance of regular aerobic exercise and  losing weight. 4. Reduce dietary  saturated fat and cholesterol intake for overall cardiovascular health. 5. Maintaining adequate dietary potassium, calcium, and magnesium intake. 6. Regular monitoring of the blood pressure. 7. Reduce sodium intake to less than 100 mmol/day (less than 2.3 gm of sodium or less than 6 gm of sodium choride)   This patient was seen by [place] with Dr Lavera Guise as a part of collaborative care agreement  Time spent: 25 Minutes      Dr Lavera Guise Internal medicine

## 2018-06-20 ENCOUNTER — Other Ambulatory Visit: Payer: Self-pay | Admitting: Nurse Practitioner

## 2018-06-20 ENCOUNTER — Encounter: Payer: Self-pay | Admitting: Nurse Practitioner

## 2018-06-20 DIAGNOSIS — G4452 New daily persistent headache (NDPH): Secondary | ICD-10-CM

## 2018-06-20 MED ORDER — SUMATRIPTAN SUCCINATE 50 MG PO TABS: 50.0000 mg | ORAL_TABLET | ORAL | 2 refills | Status: DC | PRN

## 2018-07-23 ENCOUNTER — Encounter: Payer: Self-pay | Admitting: Nurse Practitioner

## 2018-07-23 ENCOUNTER — Ambulatory Visit: Payer: Managed Care, Other (non HMO) | Admitting: Nurse Practitioner

## 2018-07-23 ENCOUNTER — Other Ambulatory Visit: Payer: Self-pay

## 2018-07-23 VITALS — BP 133/80 | HR 72 | Resp 16 | Ht 67.0 in | Wt 168.0 lb

## 2018-07-23 DIAGNOSIS — G43809 Other migraine, not intractable, without status migrainosus: Secondary | ICD-10-CM | POA: Diagnosis not present

## 2018-07-23 DIAGNOSIS — I10 Essential (primary) hypertension: Secondary | ICD-10-CM

## 2018-07-23 DIAGNOSIS — R41840 Attention and concentration deficit: Secondary | ICD-10-CM

## 2018-07-23 DIAGNOSIS — G43909 Migraine, unspecified, not intractable, without status migrainosus: Secondary | ICD-10-CM | POA: Insufficient documentation

## 2018-07-23 DIAGNOSIS — F411 Generalized anxiety disorder: Secondary | ICD-10-CM

## 2018-07-23 MED ORDER — LISDEXAMFETAMINE DIMESYLATE 50 MG PO CAPS: 50.0000 mg | ORAL_CAPSULE | ORAL | 0 refills | Status: DC

## 2018-07-23 MED ORDER — ALPRAZOLAM 0.5 MG PO TABS: 0.5000 mg | ORAL_TABLET | Freq: Two times a day (BID) | ORAL | 3 refills | Status: DC | PRN

## 2018-07-23 NOTE — Progress Notes (Signed)
[facility] [place], [place] [address]  Internal MEDICINE  Office Visit Note  Patient Name: Jillian Edwards  [id number]  [id number]  Date of Service: 07/23/2018  Chief Complaint  Patient presents with  . Hypertension  . Depression  . Anxiety  . Quality Metric Gaps    mammogram    She has a difficult time with concentration, especially in the afternoons. Work is piling up and she just looks at it and just doesn't know how to even start. Had to get motivated to do the work that is piling up. Getting very sleeping in the afternoons as well. She is currently on vyvanse 40mg  daily. Really helps her to focus and concentrate at work, however, in the afternoon, it's all she can do to stay awake. Feels like the vyvanse doesn't do much in the afternoons. .  She states that she has had a big improvement in her headaches, both In frequency and severity of the headaches. She continues to take propranolol ER 60 mg every day. Blood pressure continues to be very well controlled. When she does have acute headache, she will either take the sumatriptan or ubrelvy. Both of these medications work very well to get rid of the headache.  She has no new concerns or complaints today. She has been doing well. States that even her sinuses have been doing well despite the increased pollen levels. She does still need to have her routine, fasting labs drawn. She is [place] due for mammogram, however, would like to hold off on this until severity of COVID 19 begins to improve.       Current Medication: Outpatient Encounter Medications as of 07/23/2018  Medication Sig  . ALPRAZolam (XANAX) 0.5 MG tablet Take 1 tablet (0.5 mg total) by mouth 2 (two) times daily as needed for anxiety.  Marland Kitchen aspirin EC 81 MG tablet Take 81 mg by mouth daily.  Marland Kitchen buPROPion (WELLBUTRIN XL) 150 MG 24 hr tablet Take 2 tablets by mouth daily  . butalbital-acetaminophen-caffeine (FIORICET, ESGIC) 50-325-40 MG tablet Take  1-2 tablets by mouth every 6 (six) hours as needed for headache.  . calcium carbonate (TUMS - DOSED IN MG ELEMENTAL CALCIUM) 500 MG chewable tablet Chew 1 tablet by mouth daily.  . cholecalciferol (VITAMIN D) 1000 units tablet Take 2,000 Units by mouth daily.   Marland Kitchen doxycycline (PERIOSTAT) 20 MG tablet   . hydrochlorothiazide (HYDRODIURIL) 25 MG tablet Take 1 tablet (25 mg total) by mouth daily. Take 1 tablet QAM and 1/2 tablet QPM as needed  . Ivermectin 1 % CREA   . levonorgestrel (MIRENA) 20 MCG/24HR IUD 1 each by Intrauterine route once.  . lisdexamfetamine (VYVANSE) 50 MG capsule Take 1 capsule (50 mg total) by mouth every morning.  . meloxicam (MOBIC) 7.5 MG tablet Take 1 tablet (7.5 mg total) by mouth daily.  . montelukast (SINGULAIR) 10 MG tablet Take 10 mg by mouth every evening.  . propranolol ER (INDERAL LA) 60 MG 24 hr capsule Take 1 capsule (60 mg total) by mouth daily.  . SUMAtriptan (IMITREX) 50 MG tablet Take 1 tablet (50 mg total) by mouth every 2 (two) hours as needed for migraine. May repeat in 2 hours if headache persists or recurs.  . [DISCONTINUED] ALPRAZolam (XANAX) 0.5 MG tablet Take 1 tablet (0.5 mg total) by mouth 2 (two) times daily as needed for anxiety.  . [DISCONTINUED] lisdexamfetamine (VYVANSE) 40 MG capsule Take 1 capsule (40 mg total) by mouth every morning.  . [  DISCONTINUED] lisdexamfetamine (VYVANSE) 50 MG capsule Take 1 capsule (50 mg total) by mouth every morning.  . [DISCONTINUED] lisdexamfetamine (VYVANSE) 50 MG capsule Take 1 capsule (50 mg total) by mouth every morning.   No facility-administered encounter medications on file as of 07/23/2018.     Surgical History: Past Surgical History:  Procedure Laterality Date  . BREAST BIOPSY Left 02/06/2013   stereotactic biopsy of calcifications-benign  . BREAST ENHANCEMENT SURGERY  08/2003  . COLONOSCOPY WITH PROPOFOL N/A 12/14/2016   Procedure: COLONOSCOPY WITH PROPOFOL;  Surgeon: Jonathon Bellows, MD;  Location:  [facility] ENDOSCOPY;  Service: Gastroenterology;  Laterality: N/A;  . COLPOSCOPY  03/1998; 07/1999   2000-no lesion; 2001-CIN1  . ENDOMETRIAL BIOPSY  03/2007   proliferative    Medical History: Past Medical History:  Diagnosis Date  . ADD (attention deficit disorder)   . Anxiety   . Depression   . Frequent urination   . Headache   . Herpes genitalis   . Hypertension   . Stress headaches   . Stroke ([place]) ~2005    loss of peripheral vision  . Stroke or transient ischemic attack (TIA) diagnosed during current admission   . Urological disorder    bladder has dropped    Family History: Family History  Problem Relation Age of Onset  . Hypertension Father   . Diabetes Father        Has also had an amputation  . Heart attack Father   . Diabetes Mellitus II Father   . Diabetes Mellitus II Brother   . Hypertension Brother   . Breast cancer Maternal Grandmother 60  . Diabetes Mellitus II Paternal Grandmother     Social History   Socioeconomic History  . Marital status: Legally Separated    Spouse name: Not on file  . Number of children: 1  . Years of education: 27  . Highest education level: Not on file  Occupational History  . Occupation: Corporate treasurer  Social Needs  . Financial resource strain: Not on file  . Food insecurity:    Worry: Not on file    Inability: Not on file  . Transportation needs:    Medical: Not on file    Non-medical: Not on file  Tobacco Use  . Smoking status: Never Smoker  . Smokeless tobacco: Never Used  Substance and Sexual Activity  . Alcohol use: No    Alcohol/week: 0.0 standard drinks  . Drug use: No  . Sexual activity: Yes    Birth control/protection: I.U.D.    Comment: Mirena  Lifestyle  . Physical activity:    Days per week: Not on file    Minutes per session: Not on file  . Stress: Not on file  Relationships  . Social connections:    Talks on phone: Not on file    Gets together: Not on file    Attends religious  service: Not on file    Active member of club or organization: Not on file    Attends meetings of clubs or organizations: Not on file    Relationship status: Not on file  . Intimate partner violence:    Fear of current or ex partner: Not on file    Emotionally abused: Not on file    Physically abused: Not on file    Forced sexual activity: Not on file  Other Topics Concern  . Not on file  Social History Narrative  . Not on file      Review of Systems  Constitutional:  Negative for activity change, chills, fatigue and unexpected weight change.  HENT: Negative for congestion, ear pain, rhinorrhea, sinus pressure, sneezing and sore throat.   Respiratory: Negative for cough, chest tightness, shortness of breath and wheezing.   Cardiovascular: Negative for chest pain and palpitations.       Blood pressure doing well   Gastrointestinal: Negative for abdominal pain, constipation, diarrhea, nausea and vomiting.  Endocrine: Negative for cold intolerance, heat intolerance and polyuria.  Musculoskeletal: Negative for arthralgias, back pain, joint swelling, neck pain and neck stiffness.  Skin: Negative for rash.  Allergic/Immunologic: Positive for environmental allergies.  Neurological: Negative for dizziness, tremors, seizures, syncope, speech difficulty, numbness and headaches.       Headaches have improved in severity and frequency.  Hematological: Negative for adenopathy. Does not bruise/bleed easily.  Psychiatric/Behavioral: Positive for decreased concentration. Negative for behavioral problems and sleep disturbance. The patient is nervous/anxious.     Today's Vitals   07/23/18 0832  BP: 133/80  Pulse: 72  Resp: 16  SpO2: 100%  Weight: 168 lb (76.2 kg)  Height: 5\' 7"  (1.702 m)   Body mass index is 26.31 kg/m.  Physical Exam Vitals signs and nursing note reviewed.  Constitutional:      Appearance: Normal appearance. She is well-developed. She is not diaphoretic.  HENT:      Head: Normocephalic and atraumatic.     Mouth/Throat:     Pharynx: No oropharyngeal exudate.  Eyes:     Extraocular Movements: Extraocular movements intact.     Conjunctiva/sclera: Conjunctivae normal.     Pupils: Pupils are equal, round, and reactive to light.  Neck:     Musculoskeletal: Normal range of motion and neck supple.     Thyroid: No thyromegaly.     Vascular: No carotid bruit or JVD.     Trachea: No tracheal deviation.  Cardiovascular:     Rate and Rhythm: Normal rate and regular rhythm.     Heart sounds: Normal heart sounds. No murmur. No friction rub. No gallop.   Pulmonary:     Effort: Pulmonary effort is normal. No respiratory distress.     Breath sounds: Normal breath sounds. No wheezing or rales.  Chest:     Chest wall: No tenderness.  Abdominal:     Tenderness: There is no abdominal tenderness. There is no guarding.  Musculoskeletal: Normal range of motion.  Lymphadenopathy:     Cervical: No cervical adenopathy.  Skin:    General: Skin is warm and dry.  Neurological:     General: No focal deficit present.     Mental Status: She is alert and oriented to person, place, and time.     Cranial Nerves: No cranial nerve deficit.  Psychiatric:        Behavior: Behavior normal.        Thought Content: Thought content normal.        Judgment: Judgment normal.   Assessment/Plan: 1. Essential hypertension Stable. Continue propranolol ER 60mg  and HCTZ as prescribed.   2. Other migraine without status migrainosus, not intractable Improved. Continue to take sumatriptan and/or ubrelvy as needed and as prescribed to alleviate acute migraine headaches.  3. GAD (generalized anxiety disorder) Well controlled. May continue to take alprazolam 0.5mg  twice daily if needed for acute anxiety. New prescription sent to her pharmacy today.  - ALPRAZolam (XANAX) 0.5 MG tablet; Take 1 tablet (0.5 mg total) by mouth 2 (two) times daily as needed for anxiety.  Dispense: 60 tablet;  Refill: 3  4. Attention and concentration deficit Increased vyvanse to 50mg  daily. Three 30 day prescriptions sent to her pharmacy. Dates are 07/23/2018, 08/20/2018, and 09/18/2018 - lisdexamfetamine (VYVANSE) 50 MG capsule; Take 1 capsule (50 mg total) by mouth every morning.  Dispense: 30 capsule; Refill: 0  General Counseling: Heiress verbalizes understanding of the findings of todays visit and agrees with plan of treatment. I have discussed any further diagnostic evaluation that may be needed or ordered today. We also reviewed her medications today. she has been encouraged to call the office with any questions or concerns that should arise related to todays visit.  Refilled Controlled medications today. Reviewed risks and possible side effects associated with taking Stimulants. Combination of these drugs with other psychotropic medications could cause dizziness and drowsiness. Pt needs to Monitor symptoms and exercise caution in driving and operating heavy machinery to avoid damages to oneself, to others and to the surroundings. Patient verbalized understanding in this matter. Dependence and abuse for these drugs will be monitored closely. A Controlled substance policy and procedure is on file which allows [facility] to order a urine drug screen test at any visit. Patient understands and agrees with the plan..  Hypertension Counseling:   The following hypertensive lifestyle modification were recommended and discussed:  1. Limiting alcohol intake to less than 1 oz/day of ethanol:(24 oz of beer or 8 oz of wine or 2 oz of 100-proof whiskey). 2. Take baby ASA 81 mg daily. 3. Importance of regular aerobic exercise and losing weight. 4. Reduce dietary saturated fat and cholesterol intake for overall cardiovascular health. 5. Maintaining adequate dietary potassium, calcium, and magnesium intake. 6. Regular monitoring of the blood pressure. 7. Reduce sodium intake to less than 100 mmol/day  (less than 2.3 gm of sodium or less than 6 gm of sodium choride)   This patient was seen by [place] with Dr Lavera Guise as a part of collaborative care agreement  Meds ordered this encounter  Medications  . ALPRAZolam (XANAX) 0.5 MG tablet    Sig: Take 1 tablet (0.5 mg total) by mouth 2 (two) times daily as needed for anxiety.    Dispense:  60 tablet    Refill:  3    Order Specific Question:   Supervising Provider    Answer:   Lavera Guise [[id number]]  . DISCONTD: lisdexamfetamine (VYVANSE) 50 MG capsule    Sig: Take 1 capsule (50 mg total) by mouth every morning.    Dispense:  30 capsule    Refill:  0    Order Specific Question:   Supervising Provider    Answer:   Lavera Guise [[id number]]  . DISCONTD: lisdexamfetamine (VYVANSE) 50 MG capsule    Sig: Take 1 capsule (50 mg total) by mouth every morning.    Dispense:  30 capsule    Refill:  0    Fill after 08/20/2018    Order Specific Question:   Supervising Provider    Answer:   Lavera Guise [[id number]]  . lisdexamfetamine (VYVANSE) 50 MG capsule    Sig: Take 1 capsule (50 mg total) by mouth every morning.    Dispense:  30 capsule    Refill:  0    Fill after 09/18/2018    Order Specific Question:   Supervising Provider    Answer:   Lavera Guise [[id number]]    Time spent: 25 Minutes      Dr Lavera Guise Internal medicine

## 2018-08-26 ENCOUNTER — Encounter: Payer: Self-pay | Admitting: Nurse Practitioner

## 2018-09-16 ENCOUNTER — Other Ambulatory Visit: Payer: Self-pay

## 2018-09-16 DIAGNOSIS — I10 Essential (primary) hypertension: Secondary | ICD-10-CM

## 2018-09-16 MED ORDER — PROPRANOLOL HCL ER 60 MG PO CP24: 60.0000 mg | ORAL_CAPSULE | Freq: Every day | ORAL | 3 refills | Status: DC

## 2018-10-14 ENCOUNTER — Other Ambulatory Visit: Payer: Self-pay

## 2018-10-14 DIAGNOSIS — Z1283 Encounter for screening for malignant neoplasm of skin: Secondary | ICD-10-CM | POA: Diagnosis not present

## 2018-10-14 DIAGNOSIS — I10 Essential (primary) hypertension: Secondary | ICD-10-CM

## 2018-10-14 DIAGNOSIS — D2230 Melanocytic nevi of unspecified part of face: Secondary | ICD-10-CM | POA: Diagnosis not present

## 2018-10-14 DIAGNOSIS — D485 Neoplasm of uncertain behavior of skin: Secondary | ICD-10-CM | POA: Diagnosis not present

## 2018-10-14 DIAGNOSIS — D225 Melanocytic nevi of trunk: Secondary | ICD-10-CM | POA: Diagnosis not present

## 2018-10-14 DIAGNOSIS — D224 Melanocytic nevi of scalp and neck: Secondary | ICD-10-CM | POA: Diagnosis not present

## 2018-10-14 MED ORDER — HYDROCHLOROTHIAZIDE 25 MG PO TABS: 25.0000 mg | ORAL_TABLET | Freq: Every day | ORAL | 3 refills | Status: DC

## 2018-10-22 ENCOUNTER — Other Ambulatory Visit: Payer: Self-pay

## 2018-10-22 ENCOUNTER — Ambulatory Visit (INDEPENDENT_AMBULATORY_CARE_PROVIDER_SITE_OTHER): Payer: Managed Care, Other (non HMO) | Admitting: Nurse Practitioner

## 2018-10-22 ENCOUNTER — Encounter: Payer: Self-pay | Admitting: Nurse Practitioner

## 2018-10-22 VITALS — BP 136/84 | HR 60 | Resp 16 | Ht 67.0 in | Wt 164.0 lb

## 2018-10-22 DIAGNOSIS — Z79899 Other long term (current) drug therapy: Secondary | ICD-10-CM | POA: Diagnosis not present

## 2018-10-22 DIAGNOSIS — I10 Essential (primary) hypertension: Secondary | ICD-10-CM | POA: Diagnosis not present

## 2018-10-22 DIAGNOSIS — R41840 Attention and concentration deficit: Secondary | ICD-10-CM

## 2018-10-22 DIAGNOSIS — G4452 New daily persistent headache (NDPH): Secondary | ICD-10-CM | POA: Diagnosis not present

## 2018-10-22 DIAGNOSIS — F411 Generalized anxiety disorder: Secondary | ICD-10-CM

## 2018-10-22 DIAGNOSIS — F5101 Primary insomnia: Secondary | ICD-10-CM | POA: Insufficient documentation

## 2018-10-22 LAB — POCT URINE DRUG SCREEN
POC Amphetamine UR: POSITIVE — AB
POC BENZODIAZEPINES UR: NOT DETECTED
POC Barbiturate UR: NOT DETECTED
POC Cocaine UR: NOT DETECTED
POC Ecstasy UR: NOT DETECTED
POC Marijuana UR: NOT DETECTED
POC Methadone UR: NOT DETECTED
POC Methamphetamine UR: NOT DETECTED
POC Opiate Ur: NOT DETECTED
POC Oxycodone UR: NOT DETECTED
POC PHENCYCLIDINE UR: NOT DETECTED
POC TRICYCLICS UR: NOT DETECTED

## 2018-10-22 MED ORDER — SUMATRIPTAN SUCCINATE 50 MG PO TABS: 50.0000 mg | ORAL_TABLET | ORAL | 3 refills | Status: DC | PRN

## 2018-10-22 MED ORDER — TRAZODONE HCL 50 MG PO TABS: 25.0000 mg | ORAL_TABLET | Freq: Every evening | ORAL | 3 refills | Status: DC | PRN

## 2018-10-22 MED ORDER — LISDEXAMFETAMINE DIMESYLATE 50 MG PO CAPS: 50.0000 mg | ORAL_CAPSULE | ORAL | 0 refills | Status: DC

## 2018-10-22 MED ORDER — BUPROPION HCL ER (XL) 150 MG PO TB24: ORAL_TABLET | ORAL | 1 refills | Status: DC

## 2018-10-22 NOTE — Progress Notes (Signed)
[facility], [place] [address]  Internal MEDICINE  Office Visit Note  Patient Name: Jillian Edwards  [id number]  [id number]  Date of Service: 10/22/2018  Chief Complaint  Patient presents with  . Medical Management of Chronic Issues  . Anxiety  . Depression  . Hypertension    The patient is here for routine follow up visit. She states that headaches are well controlled. She is taking propranolol every day. Will take sumatriptan at the onset of headache and this will get rid of this very quickly. Today, her biggest issue is problems sleeping. She states that she is having a lot of hot flashes which are keeping her awake at night. Tossing and turning, putting covers on and taking them back off. No longer sleeps with TV on. States that she sleeps for two hours at a time. States that by lunchtime she is so sleepy. She feels that the vyvanse does help some keeping her focused and on track, but does not help with fatigue which is coming from not sleeping well.       Current Medication: Outpatient Encounter Medications as of 10/22/2018  Medication Sig  . ALPRAZolam (XANAX) 0.5 MG tablet Take 1 tablet (0.5 mg total) by mouth 2 (two) times daily as needed for anxiety.  Marland Kitchen aspirin EC 81 MG tablet Take 81 mg by mouth daily.  Marland Kitchen buPROPion (WELLBUTRIN XL) 150 MG 24 hr tablet Take 2 tablets by mouth daily  . butalbital-acetaminophen-caffeine (FIORICET, ESGIC) 50-325-40 MG tablet Take 1-2 tablets by mouth every 6 (six) hours as needed for headache.  . calcium carbonate (TUMS - DOSED IN MG ELEMENTAL CALCIUM) 500 MG chewable tablet Chew 1 tablet by mouth daily.  . cholecalciferol (VITAMIN D) 1000 units tablet Take 2,000 Units by mouth daily.   Marland Kitchen doxycycline (PERIOSTAT) 20 MG tablet   . hydrochlorothiazide (HYDRODIURIL) 25 MG tablet Take 1 tablet (25 mg total) by mouth daily. Take 1 tablet QAM and 1/2 tablet QPM as needed  . Ivermectin 1 % CREA   . levonorgestrel  (MIRENA) 20 MCG/24HR IUD 1 each by Intrauterine route once.  . lisdexamfetamine (VYVANSE) 50 MG capsule Take 1 capsule (50 mg total) by mouth every morning. Fill after 12/17/2018  . meloxicam (MOBIC) 7.5 MG tablet Take 1 tablet (7.5 mg total) by mouth daily.  . propranolol ER (INDERAL LA) 60 MG 24 hr capsule Take 1 capsule (60 mg total) by mouth daily.  . SUMAtriptan (IMITREX) 50 MG tablet Take 1 tablet (50 mg total) by mouth every 2 (two) hours as needed for migraine. May repeat in 2 hours if headache persists or recurs.  . vitamin E 100 UNIT capsule Take 100 Units by mouth daily.  . [DISCONTINUED] buPROPion (WELLBUTRIN XL) 150 MG 24 hr tablet Take 2 tablets by mouth daily  . [DISCONTINUED] lisdexamfetamine (VYVANSE) 50 MG capsule Take 1 capsule (50 mg total) by mouth every morning.  . [DISCONTINUED] lisdexamfetamine (VYVANSE) 50 MG capsule Take 1 capsule (50 mg total) by mouth every morning.  . [DISCONTINUED] lisdexamfetamine (VYVANSE) 50 MG capsule Take 1 capsule (50 mg total) by mouth every morning.  . [DISCONTINUED] SUMAtriptan (IMITREX) 50 MG tablet Take 1 tablet (50 mg total) by mouth every 2 (two) hours as needed for migraine. May repeat in 2 hours if headache persists or recurs.  . traZODone (DESYREL) 50 MG tablet Take 0.5-1 tablets (25-50 mg total) by mouth at bedtime as needed for sleep.  . [DISCONTINUED] montelukast (SINGULAIR) 10 MG  tablet Take 10 mg by mouth every evening.   No facility-administered encounter medications on file as of 10/22/2018.     Surgical History: Past Surgical History:  Procedure Laterality Date  . BREAST BIOPSY Left 02/06/2013   stereotactic biopsy of calcifications-benign  . BREAST ENHANCEMENT SURGERY  08/2003  . COLONOSCOPY WITH PROPOFOL N/A 12/14/2016   Procedure: COLONOSCOPY WITH PROPOFOL;  Surgeon: Jonathon Bellows, MD;  Location: [facility] ENDOSCOPY;  Service: Gastroenterology;  Laterality: N/A;  . COLPOSCOPY  03/1998; 07/1999   2000-no lesion; 2001-CIN1  .  ENDOMETRIAL BIOPSY  03/2007   proliferative    Medical History: Past Medical History:  Diagnosis Date  . ADD (attention deficit disorder)   . Anxiety   . Depression   . Frequent urination   . Headache   . Herpes genitalis   . Hypertension   . Stress headaches   . Stroke ([place]) ~2005    loss of peripheral vision  . Stroke or transient ischemic attack (TIA) diagnosed during current admission   . Urological disorder    bladder has dropped    Family History: Family History  Problem Relation Age of Onset  . Hypertension Father   . Diabetes Father        Has also had an amputation  . Heart attack Father   . Diabetes Mellitus II Father   . Diabetes Mellitus II Brother   . Hypertension Brother   . Breast cancer Maternal Grandmother 60  . Diabetes Mellitus II Paternal Grandmother     Social History   Socioeconomic History  . Marital status: Legally Separated    Spouse name: Not on file  . Number of children: 1  . Years of education: 87  . Highest education level: Not on file  Occupational History  . Occupation: Corporate treasurer  Social Needs  . Financial resource strain: Not on file  . Food insecurity    Worry: Not on file    Inability: Not on file  . Transportation needs    Medical: Not on file    Non-medical: Not on file  Tobacco Use  . Smoking status: Never Smoker  . Smokeless tobacco: Never Used  Substance and Sexual Activity  . Alcohol use: No    Alcohol/week: 0.0 standard drinks  . Drug use: No  . Sexual activity: Yes    Birth control/protection: I.U.D.    Comment: Mirena  Lifestyle  . Physical activity    Days per week: Not on file    Minutes per session: Not on file  . Stress: Not on file  Relationships  . Social Herbalist on phone: Not on file    Gets together: Not on file    Attends religious service: Not on file    Active member of club or organization: Not on file    Attends meetings of clubs or organizations: Not on  file    Relationship status: Not on file  . Intimate partner violence    Fear of current or ex partner: Not on file    Emotionally abused: Not on file    Physically abused: Not on file    Forced sexual activity: Not on file  Other Topics Concern  . Not on file  Social History Narrative  . Not on file      Review of Systems  Constitutional: Positive for fatigue. Negative for activity change, chills and unexpected weight change.  HENT: Negative for congestion, ear pain, rhinorrhea, sinus pressure, sneezing and sore throat.  Respiratory: Negative for cough, chest tightness, shortness of breath and wheezing.   Cardiovascular: Negative for chest pain and palpitations.  Gastrointestinal: Negative for abdominal pain, constipation, diarrhea, nausea and vomiting.  Endocrine: Negative for cold intolerance, heat intolerance and polyuria.  Musculoskeletal: Negative for arthralgias, back pain, joint swelling, neck pain and neck stiffness.  Skin: Negative for rash.  Allergic/Immunologic: Positive for environmental allergies.  Neurological: Negative for dizziness, tremors, seizures, syncope, speech difficulty, numbness and headaches.       Headaches have improved in severity and frequency.  Hematological: Negative for adenopathy. Does not bruise/bleed easily.  Psychiatric/Behavioral: Positive for decreased concentration and sleep disturbance. Negative for behavioral problems. The patient is nervous/anxious.     Today's Vitals   10/22/18 0849  BP: 136/84  Pulse: 60  Resp: 16  SpO2: 100%  Weight: 164 lb (74.4 kg)  Height: 5\' 7"  (1.702 m)   Body mass index is 25.69 kg/m.  Physical Exam Vitals signs and nursing note reviewed.  Constitutional:      Appearance: Normal appearance. She is well-developed. She is not diaphoretic.  HENT:     Head: Normocephalic and atraumatic.     Mouth/Throat:     Pharynx: No oropharyngeal exudate.  Eyes:     Extraocular Movements: Extraocular movements  intact.     Conjunctiva/sclera: Conjunctivae normal.     Pupils: Pupils are equal, round, and reactive to light.  Neck:     Musculoskeletal: Normal range of motion and neck supple.     Thyroid: No thyromegaly.     Vascular: No carotid bruit or JVD.     Trachea: No tracheal deviation.  Cardiovascular:     Rate and Rhythm: Normal rate and regular rhythm.     Heart sounds: Normal heart sounds. No murmur. No friction rub. No gallop.   Pulmonary:     Effort: Pulmonary effort is normal. No respiratory distress.     Breath sounds: Normal breath sounds. No wheezing or rales.  Chest:     Chest wall: No tenderness.  Abdominal:     Tenderness: There is no abdominal tenderness. There is no guarding.  Musculoskeletal: Normal range of motion.  Lymphadenopathy:     Cervical: No cervical adenopathy.  Skin:    General: Skin is warm and dry.  Neurological:     General: No focal deficit present.     Mental Status: She is alert and oriented to person, place, and time.     Cranial Nerves: No cranial nerve deficit.  Psychiatric:        Behavior: Behavior normal.        Thought Content: Thought content normal.        Judgment: Judgment normal.    Assessment/Plan: 1. Essential hypertension Stable. Continue bp medication as prescribed   2. New daily persistent headache May take imitrex as needed for acute migraine.  - SUMAtriptan (IMITREX) 50 MG tablet; Take 1 tablet (50 mg total) by mouth every 2 (two) hours as needed for migraine. May repeat in 2 hours if headache persists or recurs.  Dispense: 10 tablet; Refill: 3  3. Primary insomnia Start trazodne 50mg , take 1/2 to 1 tablet po at bedtime as needed . - traZODone (DESYREL) 50 MG tablet; Take 0.5-1 tablets (25-50 mg total) by mouth at bedtime as needed for sleep.  Dispense: 30 tablet; Refill: 3  4. Attention and concentration deficit May continue to take vyvanse 50mg  daily. Three 30 day prescriptions sent to her pharmacy. Dates are 10/22/2018,  11/20/2018, and  12/19/2018 - lisdexamfetamine (VYVANSE) 50 MG capsule; Take 1 capsule (50 mg total) by mouth every morning. Fill after 12/17/2018  Dispense: 30 capsule; Refill: 0  5. GAD (generalized anxiety disorder) Continue wellbutrin as prescribed  - buPROPion (WELLBUTRIN XL) 150 MG 24 hr tablet; Take 2 tablets by mouth daily  Dispense: 180 tablet; Refill: 1  6. Encounter for long-term (current) use of medications - POCT Urine Drug Screen appropriately positive for AMP only.   General Counseling: Rena verbalizes understanding of the findings of todays visit and agrees with plan of treatment. I have discussed any further diagnostic evaluation that may be needed or ordered today. We also reviewed her medications today. she has been encouraged to call the office with any questions or concerns that should arise related to todays visit.  Hypertension Counseling:   The following hypertensive lifestyle modification were recommended and discussed:  1. Limiting alcohol intake to less than 1 oz/day of ethanol:(24 oz of beer or 8 oz of wine or 2 oz of 100-proof whiskey). 2. Take baby ASA 81 mg daily. 3. Importance of regular aerobic exercise and losing weight. 4. Reduce dietary saturated fat and cholesterol intake for overall cardiovascular health. 5. Maintaining adequate dietary potassium, calcium, and magnesium intake. 6. Regular monitoring of the blood pressure. 7. Reduce sodium intake to less than 100 mmol/day (less than 2.3 gm of sodium or less than 6 gm of sodium choride)   This patient was seen by [place] with Dr Lavera Guise as a part of collaborative care agreement  Orders Placed This Encounter  Procedures  . POCT Urine Drug Screen    Meds ordered this encounter  Medications  . traZODone (DESYREL) 50 MG tablet    Sig: Take 0.5-1 tablets (25-50 mg total) by mouth at bedtime as needed for sleep.    Dispense:  30 tablet    Refill:  3    Order Specific  Question:   Supervising Provider    Answer:   Lavera Guise [[id number]]  . DISCONTD: lisdexamfetamine (VYVANSE) 50 MG capsule    Sig: Take 1 capsule (50 mg total) by mouth every morning.    Dispense:  30 capsule    Refill:  0    Order Specific Question:   Supervising Provider    Answer:   Lavera Guise [[id number]]  . buPROPion (WELLBUTRIN XL) 150 MG 24 hr tablet    Sig: Take 2 tablets by mouth daily    Dispense:  180 tablet    Refill:  1    Order Specific Question:   Supervising Provider    Answer:   Lavera Guise [place]  . SUMAtriptan (IMITREX) 50 MG tablet    Sig: Take 1 tablet (50 mg total) by mouth every 2 (two) hours as needed for migraine. May repeat in 2 hours if headache persists or recurs.    Dispense:  10 tablet    Refill:  3    Order Specific Question:   Supervising Provider    Answer:   Lavera Guise [[id number]]  . DISCONTD: lisdexamfetamine (VYVANSE) 50 MG capsule    Sig: Take 1 capsule (50 mg total) by mouth every morning.    Dispense:  30 capsule    Refill:  0    Fill after 11/20/2018    Order Specific Question:   Supervising Provider    Answer:   Lavera Guise [[id number]]  . lisdexamfetamine (VYVANSE) 50 MG capsule    Sig: Take 1 capsule (  50 mg total) by mouth every morning. Fill after 12/17/2018    Dispense:  30 capsule    Refill:  0    Fill after 11/20/2018    Order Specific Question:   Supervising Provider    Answer:   Lavera Guise [[id number]]    Time spent: 25 Minutes      Dr Lavera Guise Internal medicine

## 2018-11-13 ENCOUNTER — Other Ambulatory Visit: Payer: Self-pay | Admitting: Nurse Practitioner

## 2018-11-13 DIAGNOSIS — F5101 Primary insomnia: Secondary | ICD-10-CM

## 2018-12-13 ENCOUNTER — Other Ambulatory Visit: Payer: Self-pay

## 2018-12-13 DIAGNOSIS — I10 Essential (primary) hypertension: Secondary | ICD-10-CM

## 2018-12-13 MED ORDER — PROPRANOLOL HCL ER 60 MG PO CP24: 60.0000 mg | ORAL_CAPSULE | Freq: Every day | ORAL | 3 refills | Status: DC

## 2019-01-01 ENCOUNTER — Other Ambulatory Visit: Payer: Self-pay

## 2019-01-01 DIAGNOSIS — I10 Essential (primary) hypertension: Secondary | ICD-10-CM

## 2019-01-01 MED ORDER — HYDROCHLOROTHIAZIDE 25 MG PO TABS: 25.0000 mg | ORAL_TABLET | Freq: Every day | ORAL | 3 refills | Status: DC

## 2019-01-02 ENCOUNTER — Other Ambulatory Visit: Payer: Self-pay | Admitting: Nurse Practitioner

## 2019-01-02 ENCOUNTER — Encounter: Payer: Self-pay | Admitting: Nurse Practitioner

## 2019-01-02 DIAGNOSIS — I10 Essential (primary) hypertension: Secondary | ICD-10-CM

## 2019-01-02 MED ORDER — PROPRANOLOL HCL ER 60 MG PO CP24: 60.0000 mg | ORAL_CAPSULE | Freq: Every day | ORAL | 3 refills | Status: DC

## 2019-01-02 MED ORDER — HYDROCHLOROTHIAZIDE 25 MG PO TABS: 25.0000 mg | ORAL_TABLET | Freq: Every day | ORAL | 3 refills | Status: DC

## 2019-01-02 NOTE — Progress Notes (Signed)
Refilled prescription for hydrochlorothiazide and propranolol and sent to [facility]

## 2019-01-14 ENCOUNTER — Encounter: Payer: Self-pay | Admitting: Nurse Practitioner

## 2019-01-14 ENCOUNTER — Other Ambulatory Visit: Payer: Self-pay

## 2019-01-14 DIAGNOSIS — F411 Generalized anxiety disorder: Secondary | ICD-10-CM

## 2019-01-14 MED ORDER — BUPROPION HCL ER (XL) 150 MG PO TB24: ORAL_TABLET | ORAL | 1 refills | Status: DC

## 2019-01-20 ENCOUNTER — Encounter: Payer: Self-pay | Admitting: Nurse Practitioner

## 2019-01-20 ENCOUNTER — Ambulatory Visit (INDEPENDENT_AMBULATORY_CARE_PROVIDER_SITE_OTHER): Payer: BLUE CROSS/BLUE SHIELD | Admitting: Nurse Practitioner

## 2019-01-20 ENCOUNTER — Other Ambulatory Visit: Payer: Self-pay

## 2019-01-20 VITALS — BP 142/82 | HR 76 | Temp 97.5°F | Resp 16 | Ht 67.0 in | Wt 159.0 lb

## 2019-01-20 DIAGNOSIS — F411 Generalized anxiety disorder: Secondary | ICD-10-CM | POA: Diagnosis not present

## 2019-01-20 DIAGNOSIS — I10 Essential (primary) hypertension: Secondary | ICD-10-CM

## 2019-01-20 DIAGNOSIS — J0141 Acute recurrent pansinusitis: Secondary | ICD-10-CM

## 2019-01-20 DIAGNOSIS — R41840 Attention and concentration deficit: Secondary | ICD-10-CM

## 2019-01-20 MED ORDER — LISDEXAMFETAMINE DIMESYLATE 50 MG PO CAPS: 50.0000 mg | ORAL_CAPSULE | ORAL | 0 refills | Status: DC

## 2019-01-20 MED ORDER — AMOXICILLIN-POT CLAVULANATE 875-125 MG PO TABS: 1.0000 | ORAL_TABLET | Freq: Two times a day (BID) | ORAL | 0 refills | Status: DC

## 2019-01-20 MED ORDER — ALPRAZOLAM 0.5 MG PO TABS: 0.5000 mg | ORAL_TABLET | Freq: Two times a day (BID) | ORAL | 3 refills | Status: DC | PRN

## 2019-01-20 NOTE — Progress Notes (Signed)
[facility] [place], [place] [address]  Internal MEDICINE  Office Visit Note  Patient Name: Jillian Edwards  [id number]  [id number]  Date of Service: 01/20/2019  Chief Complaint  Patient presents with  . Hypertension  . Anxiety    The patient is here for routine follow up. She states that she has some left sinus pressure and headache. Has been going on for about a week. Has fluid and congestion on the left ear. Has been going on for about one week. Denies sore throat or cough. Feels like vyvanse is doing well. Still gets very sleepy after lunch nad by the end of the day she is "ready to crash." states that she does not sleep well at all. Did try trazodone. Took one tablet one night. She got dizzy and felt "weird." states that she never tried taking it again.       Current Medication: Outpatient Encounter Medications as of 01/20/2019  Medication Sig  . ALPRAZolam (XANAX) 0.5 MG tablet Take 1 tablet (0.5 mg total) by mouth 2 (two) times daily as needed for anxiety.  Marland Kitchen aspirin EC 81 MG tablet Take 81 mg by mouth daily.  Marland Kitchen buPROPion (WELLBUTRIN XL) 150 MG 24 hr tablet Take 2 tablets by mouth daily  . butalbital-acetaminophen-caffeine (FIORICET, ESGIC) 50-325-40 MG tablet Take 1-2 tablets by mouth every 6 (six) hours as needed for headache.  . cholecalciferol (VITAMIN D) 1000 units tablet Take 2,000 Units by mouth daily.   Marland Kitchen doxycycline (PERIOSTAT) 20 MG tablet   . hydrochlorothiazide (HYDRODIURIL) 25 MG tablet Take 1 tablet (25 mg total) by mouth daily. Take 1 tablet QAM and 1/2 tablet QPM as needed  . Ivermectin 1 % CREA   . levonorgestrel (MIRENA) 20 MCG/24HR IUD 1 each by Intrauterine route once.  . lisdexamfetamine (VYVANSE) 50 MG capsule Take 1 capsule (50 mg total) by mouth every morning. Fill after 12/17/2018  . meloxicam (MOBIC) 7.5 MG tablet Take 1 tablet (7.5 mg total) by mouth daily.  . propranolol ER (INDERAL LA) 60 MG 24 hr capsule Take 1 capsule  (60 mg total) by mouth daily.  . SUMAtriptan (IMITREX) 50 MG tablet Take 1 tablet (50 mg total) by mouth every 2 (two) hours as needed for migraine. May repeat in 2 hours if headache persists or recurs.  . traZODone (DESYREL) 50 MG tablet TAKE 0.5-1 TABLETS (25-50 MG TOTAL) BY MOUTH AT BEDTIME AS NEEDED FOR SLEEP.  Marland Kitchen vitamin E 100 UNIT capsule Take 100 Units by mouth daily.  . [DISCONTINUED] ALPRAZolam (XANAX) 0.5 MG tablet Take 1 tablet (0.5 mg total) by mouth 2 (two) times daily as needed for anxiety.  . [DISCONTINUED] lisdexamfetamine (VYVANSE) 50 MG capsule Take 1 capsule (50 mg total) by mouth every morning. Fill after 12/17/2018  . [DISCONTINUED] lisdexamfetamine (VYVANSE) 50 MG capsule Take 1 capsule (50 mg total) by mouth every morning. Fill after 12/17/2018  . [DISCONTINUED] lisdexamfetamine (VYVANSE) 50 MG capsule Take 1 capsule (50 mg total) by mouth every morning. Fill after 12/17/2018  . amoxicillin-clavulanate (AUGMENTIN) 875-125 MG tablet Take 1 tablet by mouth 2 (two) times daily.  . calcium carbonate (TUMS - DOSED IN MG ELEMENTAL CALCIUM) 500 MG chewable tablet Chew 1 tablet by mouth daily.   No facility-administered encounter medications on file as of 01/20/2019.     Surgical History: Past Surgical History:  Procedure Laterality Date  . BREAST BIOPSY Left 02/06/2013   stereotactic biopsy of calcifications-benign  . BREAST ENHANCEMENT SURGERY  08/2003  . COLONOSCOPY WITH PROPOFOL N/A 12/14/2016   Procedure: COLONOSCOPY WITH PROPOFOL;  Surgeon: Jonathon Bellows, MD;  Location: [facility] ENDOSCOPY;  Service: Gastroenterology;  Laterality: N/A;  . COLPOSCOPY  03/1998; 07/1999   2000-no lesion; 2001-CIN1  . ENDOMETRIAL BIOPSY  03/2007   proliferative    Medical History: Past Medical History:  Diagnosis Date  . ADD (attention deficit disorder)   . Anxiety   . Depression   . Frequent urination   . Headache   . Herpes genitalis   . Hypertension   . Stress headaches   . Stroke ([place])  ~2005    loss of peripheral vision  . Stroke or transient ischemic attack (TIA) diagnosed during current admission   . Urological disorder    bladder has dropped    Family History: Family History  Problem Relation Age of Onset  . Hypertension Father   . Diabetes Father        Has also had an amputation  . Heart attack Father   . Diabetes Mellitus II Father   . Diabetes Mellitus II Brother   . Hypertension Brother   . Breast cancer Maternal Grandmother 60  . Diabetes Mellitus II Paternal Grandmother     Social History   Socioeconomic History  . Marital status: Legally Separated    Spouse name: Not on file  . Number of children: 1  . Years of education: 7  . Highest education level: Not on file  Occupational History  . Occupation: Corporate treasurer  Social Needs  . Financial resource strain: Not on file  . Food insecurity    Worry: Not on file    Inability: Not on file  . Transportation needs    Medical: Not on file    Non-medical: Not on file  Tobacco Use  . Smoking status: Never Smoker  . Smokeless tobacco: Never Used  Substance and Sexual Activity  . Alcohol use: No    Alcohol/week: 0.0 standard drinks  . Drug use: No  . Sexual activity: Yes    Birth control/protection: I.U.D.    Comment: Mirena  Lifestyle  . Physical activity    Days per week: Not on file    Minutes per session: Not on file  . Stress: Not on file  Relationships  . Social Herbalist on phone: Not on file    Gets together: Not on file    Attends religious service: Not on file    Active member of club or organization: Not on file    Attends meetings of clubs or organizations: Not on file    Relationship status: Not on file  . Intimate partner violence    Fear of current or ex partner: Not on file    Emotionally abused: Not on file    Physically abused: Not on file    Forced sexual activity: Not on file  Other Topics Concern  . Not on file  Social History Narrative   . Not on file      Review of Systems  Constitutional: Positive for fatigue. Negative for activity change, chills and unexpected weight change.  HENT: Positive for congestion, ear pain, sinus pressure and sinus pain. Negative for rhinorrhea, sneezing and sore throat.        Left ear and left sinus pain.   Respiratory: Negative for cough, chest tightness, shortness of breath and wheezing.   Cardiovascular: Negative for chest pain and palpitations.  Gastrointestinal: Negative for abdominal pain, constipation, diarrhea, nausea and  vomiting.  Endocrine: Negative for cold intolerance, heat intolerance, polydipsia and polyuria.  Musculoskeletal: Negative for arthralgias, back pain, joint swelling, neck pain and neck stiffness.  Skin: Negative for rash.  Allergic/Immunologic: Positive for environmental allergies.  Neurological: Positive for headaches. Negative for dizziness, tremors, seizures, syncope, speech difficulty and numbness.       Headaches have improved in severity and frequency.  Hematological: Negative for adenopathy. Does not bruise/bleed easily.  Psychiatric/Behavioral: Positive for decreased concentration and sleep disturbance. Negative for behavioral problems. The patient is nervous/anxious.     Today's Vitals   01/20/19 0839  BP: (!) 142/82  Pulse: 76  Resp: 16  Temp: (!) 97.5 F (36.4 C)  SpO2: 99%  Weight: 159 lb (72.1 kg)  Height: 5\' 7"  (1.702 m)   Body mass index is 24.9 kg/m.  Physical Exam Vitals signs and nursing note reviewed.  Constitutional:      Appearance: Normal appearance. She is well-developed. She is not diaphoretic.  HENT:     Head: Normocephalic and atraumatic.     Right Ear: No tenderness. Tympanic membrane is erythematous and bulging.     Left Ear: Tenderness present. Tympanic membrane is erythematous and bulging.     Nose: Congestion present.     Right Sinus: Frontal sinus tenderness present.     Left Sinus: Frontal sinus tenderness  present.     Mouth/Throat:     Pharynx: No oropharyngeal exudate.  Eyes:     Extraocular Movements: Extraocular movements intact.     Conjunctiva/sclera: Conjunctivae normal.     Pupils: Pupils are equal, round, and reactive to light.  Neck:     Musculoskeletal: Normal range of motion and neck supple.     Thyroid: No thyromegaly.     Vascular: No carotid bruit or JVD.     Trachea: No tracheal deviation.  Cardiovascular:     Rate and Rhythm: Normal rate and regular rhythm.     Heart sounds: Normal heart sounds. No murmur. No friction rub. No gallop.   Pulmonary:     Effort: Pulmonary effort is normal. No respiratory distress.     Breath sounds: Normal breath sounds. No wheezing or rales.  Chest:     Chest wall: No tenderness.  Abdominal:     Tenderness: There is no abdominal tenderness. There is no guarding.  Musculoskeletal: Normal range of motion.  Lymphadenopathy:     Cervical: Cervical adenopathy present.  Skin:    General: Skin is warm and dry.  Neurological:     General: No focal deficit present.     Mental Status: She is alert and oriented to person, place, and time.     Cranial Nerves: No cranial nerve deficit.  Psychiatric:        Behavior: Behavior normal.        Thought Content: Thought content normal.        Judgment: Judgment normal.   Assessment/Plan: 1. Acute recurrent pansinusitis augmentin 875mg  twice daiy for 10 days. May take OTC medication to help improve symptoms. Rest and increase fluids.  - amoxicillin-clavulanate (AUGMENTIN) 875-125 MG tablet; Take 1 tablet by mouth 2 (two) times daily.  Dispense: 20 tablet; Refill: 0  2. Essential hypertension Stable. continue bp medication as prescribed   3. GAD (generalized anxiety disorder) Continue bupropion as prescribed. May take alprazolam 0.5mg  twice daily as needed for acute anxiety. New prescription provided today.  - ALPRAZolam (XANAX) 0.5 MG tablet; Take 1 tablet (0.5 mg total) by mouth 2 (two) times  daily as needed for anxiety.  Dispense: 60 tablet; Refill: 3  4. Attention and concentration deficit May continue vyvanse 50mg  daily as needed for focus. Three 30 day presctiptions were provided. Dates are 01/20/2019, 02/18/2019, and 03/18/2019 - lisdexamfetamine (VYVANSE) 50 MG capsule; Take 1 capsule (50 mg total) by mouth every morning. Fill after 12/17/2018  Dispense: 30 capsule; Refill: 0  General Counseling: Jillian Edwards verbalizes understanding of the findings of todays visit and agrees with plan of treatment. I have discussed any further diagnostic evaluation that may be needed or ordered today. We also reviewed her medications today. she has been encouraged to call the office with any questions or concerns that should arise related to todays visit.  Refilled Controlled medications today. Reviewed risks and possible side effects associated with taking Stimulants. Combination of these drugs with other psychotropic medications could cause dizziness and drowsiness. Pt needs to Monitor symptoms and exercise caution in driving and operating heavy machinery to avoid damages to oneself, to others and to the surroundings. Patient verbalized understanding in this matter. Dependence and abuse for these drugs will be monitored closely. A Controlled substance policy and procedure is on file which allows [facility] to order a urine drug screen test at any visit. Patient understands and agrees with the plan..  This patient was seen by Leretha Pol FNP Collaboration with Dr Lavera Guise as a part of collaborative care agreement  Meds ordered this encounter  Medications  . amoxicillin-clavulanate (AUGMENTIN) 875-125 MG tablet    Sig: Take 1 tablet by mouth 2 (two) times daily.    Dispense:  20 tablet    Refill:  0    Order Specific Question:   Supervising Provider    Answer:   Lavera Guise [id number]  . ALPRAZolam (XANAX) 0.5 MG tablet    Sig: Take 1 tablet (0.5 mg total) by mouth 2 (two) times  daily as needed for anxiety.    Dispense:  60 tablet    Refill:  3    Order Specific Question:   Supervising Provider    Answer:   Lavera Guise [id number]  . DISCONTD: lisdexamfetamine (VYVANSE) 50 MG capsule    Sig: Take 1 capsule (50 mg total) by mouth every morning. Fill after 12/17/2018    Dispense:  30 capsule    Refill:  0    Order Specific Question:   Supervising Provider    Answer:   Lavera Guise [id number]  . DISCONTD: lisdexamfetamine (VYVANSE) 50 MG capsule    Sig: Take 1 capsule (50 mg total) by mouth every morning. Fill after 12/17/2018    Dispense:  30 capsule    Refill:  0    Fill after 02/18/2019    Order Specific Question:   Supervising Provider    Answer:   Lavera Guise [id number]  . lisdexamfetamine (VYVANSE) 50 MG capsule    Sig: Take 1 capsule (50 mg total) by mouth every morning. Fill after 12/17/2018    Dispense:  30 capsule    Refill:  0    Fill after 03/18/2019    Order Specific Question:   Supervising Provider    Answer:   Lavera Guise [id number]    Time spent: 25 Minutes      Dr Lavera Guise Internal medicine

## 2019-02-03 ENCOUNTER — Encounter: Payer: Self-pay | Admitting: Nurse Practitioner

## 2019-02-03 ENCOUNTER — Other Ambulatory Visit: Payer: Self-pay | Admitting: Nurse Practitioner

## 2019-02-03 DIAGNOSIS — R41840 Attention and concentration deficit: Secondary | ICD-10-CM

## 2019-02-03 MED ORDER — AMPHETAMINE-DEXTROAMPHETAMINE 20 MG PO TABS: 20.0000 mg | ORAL_TABLET | Freq: Two times a day (BID) | ORAL | 0 refills | Status: DC

## 2019-02-03 NOTE — Progress Notes (Signed)
D/c vyvanse. Sent single thirty day prescription for adderall 20mg  twice daily to [facility] [address].

## 2019-02-04 ENCOUNTER — Other Ambulatory Visit: Payer: Self-pay

## 2019-02-04 DIAGNOSIS — I10 Essential (primary) hypertension: Secondary | ICD-10-CM

## 2019-02-04 MED ORDER — PROPRANOLOL HCL ER 60 MG PO CP24: 60.0000 mg | ORAL_CAPSULE | Freq: Every day | ORAL | 3 refills | Status: DC

## 2019-02-17 ENCOUNTER — Encounter: Payer: Self-pay | Admitting: Nurse Practitioner

## 2019-02-19 DIAGNOSIS — Z20828 Contact with and (suspected) exposure to other viral communicable diseases: Secondary | ICD-10-CM | POA: Diagnosis not present

## 2019-03-19 DIAGNOSIS — H16041 Marginal corneal ulcer, right eye: Secondary | ICD-10-CM | POA: Diagnosis not present

## 2019-03-24 DIAGNOSIS — H16041 Marginal corneal ulcer, right eye: Secondary | ICD-10-CM | POA: Diagnosis not present

## 2019-04-07 ENCOUNTER — Other Ambulatory Visit: Payer: Self-pay | Admitting: Nurse Practitioner

## 2019-04-07 ENCOUNTER — Encounter: Payer: Self-pay | Admitting: Nurse Practitioner

## 2019-04-07 DIAGNOSIS — R41840 Attention and concentration deficit: Secondary | ICD-10-CM

## 2019-04-07 MED ORDER — AMPHETAMINE-DEXTROAMPHETAMINE 20 MG PO TABS: 20.0000 mg | ORAL_TABLET | Freq: Two times a day (BID) | ORAL | 0 refills | Status: DC

## 2019-04-07 NOTE — Progress Notes (Signed)
Filled 30 day prescription for adderall twice daily as needed.

## 2019-04-17 DIAGNOSIS — Z1231 Encounter for screening mammogram for malignant neoplasm of breast: Secondary | ICD-10-CM | POA: Diagnosis not present

## 2019-04-18 ENCOUNTER — Encounter: Payer: Self-pay | Admitting: Nurse Practitioner

## 2019-04-24 ENCOUNTER — Other Ambulatory Visit: Payer: Self-pay

## 2019-04-24 ENCOUNTER — Ambulatory Visit (INDEPENDENT_AMBULATORY_CARE_PROVIDER_SITE_OTHER): Payer: BLUE CROSS/BLUE SHIELD | Admitting: Nurse Practitioner

## 2019-04-24 ENCOUNTER — Encounter: Payer: Self-pay | Admitting: Nurse Practitioner

## 2019-04-24 VITALS — Ht 67.0 in | Wt 165.0 lb

## 2019-04-24 DIAGNOSIS — Z86018 Personal history of other benign neoplasm: Secondary | ICD-10-CM | POA: Diagnosis not present

## 2019-04-24 DIAGNOSIS — J0141 Acute recurrent pansinusitis: Secondary | ICD-10-CM | POA: Diagnosis not present

## 2019-04-24 DIAGNOSIS — H60502 Unspecified acute noninfective otitis externa, left ear: Secondary | ICD-10-CM | POA: Diagnosis not present

## 2019-04-24 DIAGNOSIS — R41840 Attention and concentration deficit: Secondary | ICD-10-CM

## 2019-04-24 DIAGNOSIS — F411 Generalized anxiety disorder: Secondary | ICD-10-CM | POA: Diagnosis not present

## 2019-04-24 DIAGNOSIS — Z1283 Encounter for screening for malignant neoplasm of skin: Secondary | ICD-10-CM | POA: Diagnosis not present

## 2019-04-24 DIAGNOSIS — L820 Inflamed seborrheic keratosis: Secondary | ICD-10-CM | POA: Diagnosis not present

## 2019-04-24 DIAGNOSIS — D225 Melanocytic nevi of trunk: Secondary | ICD-10-CM | POA: Diagnosis not present

## 2019-04-24 DIAGNOSIS — F5101 Primary insomnia: Secondary | ICD-10-CM

## 2019-04-24 DIAGNOSIS — L719 Rosacea, unspecified: Secondary | ICD-10-CM | POA: Diagnosis not present

## 2019-04-24 DIAGNOSIS — L400 Psoriasis vulgaris: Secondary | ICD-10-CM | POA: Diagnosis not present

## 2019-04-24 MED ORDER — AMPHETAMINE-DEXTROAMPHETAMINE 20 MG PO TABS: 20.0000 mg | ORAL_TABLET | Freq: Two times a day (BID) | ORAL | 0 refills | Status: DC

## 2019-04-24 MED ORDER — AMOXICILLIN-POT CLAVULANATE 875-125 MG PO TABS: 1.0000 | ORAL_TABLET | Freq: Two times a day (BID) | ORAL | 0 refills | Status: DC

## 2019-04-24 MED ORDER — HYDROCORTISONE-ACETIC ACID 1-2 % OT SOLN: 4.0000 [drp] | Freq: Two times a day (BID) | OTIC | 1 refills | Status: DC

## 2019-04-24 MED ORDER — ALPRAZOLAM 0.5 MG PO TABS: 0.5000 mg | ORAL_TABLET | Freq: Two times a day (BID) | ORAL | 3 refills | Status: DC | PRN

## 2019-04-24 MED ORDER — TRAZODONE HCL 50 MG PO TABS: 25.0000 mg | ORAL_TABLET | Freq: Every evening | ORAL | 1 refills | Status: DC | PRN

## 2019-04-24 NOTE — Progress Notes (Signed)
[facility] [place], [place] [address]  Internal MEDICINE  Telephone Visit  Patient Name: Jillian Edwards  [id number]  [id number]  Date of Service: 04/26/2019  I connected with the patient at 3:35pm by webcam and verified the patients identity using two identifiers.   I discussed the limitations, risks, security and privacy concerns of performing an evaluation and management service by webcam and the availability of in person appointments. I also discussed with the patient that there may be a patient responsible charge related to the service.  The patient expressed understanding and agrees to proceed.    Chief Complaint  Patient presents with  . Telephone Assessment  . Telephone Screen  . Hypertension  . Ear Problem    FLUID BUILD UP ON LEFT EAR     The patient has been contacted via webcam for follow up visit due to concerns for spread of novel coronavirus. The patient presents for routine visit. The patient states that her left ear feels like it's popping. Always builds up fluid. Feels like there is water in it. States that she had some "leftover" amoxicillin" and took this for two or three days. Helped her sinuses stop draining, but left ear is still feeling full.  The patient continues to take adderall 20mg  twice daily as needed for concentration and focus issues. She does well with this dosing of medication without negative side effects. She is due to have refills of this medication today.       Current Medication: Outpatient Encounter Medications as of 04/24/2019  Medication Sig  . ALPRAZolam (XANAX) 0.5 MG tablet Take 1 tablet (0.5 mg total) by mouth 2 (two) times daily as needed for anxiety.  Marland Kitchen amphetamine-dextroamphetamine (ADDERALL) 20 MG tablet Take 1 tablet (20 mg total) by mouth 2 (two) times daily.  Marland Kitchen aspirin EC 81 MG tablet Take 81 mg by mouth daily.  Marland Kitchen buPROPion (WELLBUTRIN XL) 150 MG 24 hr tablet Take 2 tablets by mouth daily  .  butalbital-acetaminophen-caffeine (FIORICET, ESGIC) 50-325-40 MG tablet Take 1-2 tablets by mouth every 6 (six) hours as needed for headache.  . calcium carbonate (TUMS - DOSED IN MG ELEMENTAL CALCIUM) 500 MG chewable tablet Chew 1 tablet by mouth daily.  . cholecalciferol (VITAMIN D) 1000 units tablet Take 2,000 Units by mouth daily.   Marland Kitchen doxycycline (PERIOSTAT) 20 MG tablet   . hydrochlorothiazide (HYDRODIURIL) 25 MG tablet Take 1 tablet (25 mg total) by mouth daily. Take 1 tablet QAM and 1/2 tablet QPM as needed  . Ivermectin 1 % CREA   . levonorgestrel (MIRENA) 20 MCG/24HR IUD 1 each by Intrauterine route once.  . lisdexamfetamine (VYVANSE) 50 MG capsule Take 1 capsule (50 mg total) by mouth every morning. Fill after 12/17/2018  . meloxicam (MOBIC) 7.5 MG tablet Take 1 tablet (7.5 mg total) by mouth daily.  . propranolol ER (INDERAL LA) 60 MG 24 hr capsule Take 1 capsule (60 mg total) by mouth daily.  . SUMAtriptan (IMITREX) 50 MG tablet Take 1 tablet (50 mg total) by mouth every 2 (two) hours as needed for migraine. May repeat in 2 hours if headache persists or recurs.  . traZODone (DESYREL) 50 MG tablet Take 0.5-1 tablets (25-50 mg total) by mouth at bedtime as needed for sleep.  . vitamin E 100 UNIT capsule Take 100 Units by mouth daily.  . [DISCONTINUED] ALPRAZolam (XANAX) 0.5 MG tablet Take 1 tablet (0.5 mg total) by mouth 2 (two) times daily as needed for anxiety.  . [  DISCONTINUED] amphetamine-dextroamphetamine (ADDERALL) 20 MG tablet Take 1 tablet (20 mg total) by mouth 2 (two) times daily.  . [DISCONTINUED] amphetamine-dextroamphetamine (ADDERALL) 20 MG tablet Take 1 tablet (20 mg total) by mouth 2 (two) times daily.  . [DISCONTINUED] amphetamine-dextroamphetamine (ADDERALL) 20 MG tablet Take 1 tablet (20 mg total) by mouth 2 (two) times daily.  . [DISCONTINUED] amphetamine-dextroamphetamine (ADDERALL) 20 MG tablet Take 1 tablet (20 mg total) by mouth 2 (two) times daily.  .  [DISCONTINUED] amphetamine-dextroamphetamine (ADDERALL) 20 MG tablet Take 1 tablet (20 mg total) by mouth 2 (two) times daily.  . [DISCONTINUED] amphetamine-dextroamphetamine (ADDERALL) 20 MG tablet Take 1 tablet (20 mg total) by mouth 2 (two) times daily.  . [DISCONTINUED] amphetamine-dextroamphetamine (ADDERALL) 20 MG tablet Take 1 tablet (20 mg total) by mouth 2 (two) times daily.  . [DISCONTINUED] traZODone (DESYREL) 50 MG tablet TAKE 0.5-1 TABLETS (25-50 MG TOTAL) BY MOUTH AT BEDTIME AS NEEDED FOR SLEEP.  Marland Kitchen acetic acid-hydrocortisone (VOSOL-HC) OTIC solution Place 4 drops into the left ear 2 (two) times daily.  Marland Kitchen amoxicillin-clavulanate (AUGMENTIN) 875-125 MG tablet Take 1 tablet by mouth 2 (two) times daily.  . [DISCONTINUED] amoxicillin-clavulanate (AUGMENTIN) 875-125 MG tablet Take 1 tablet by mouth 2 (two) times daily. (Patient not taking: Reported on 04/24/2019)   No facility-administered encounter medications on file as of 04/24/2019.    Surgical History: Past Surgical History:  Procedure Laterality Date  . BREAST BIOPSY Left 02/06/2013   stereotactic biopsy of calcifications-benign  . BREAST ENHANCEMENT SURGERY  08/2003  . COLONOSCOPY WITH PROPOFOL N/A 12/14/2016   Procedure: COLONOSCOPY WITH PROPOFOL;  Surgeon: Jonathon Bellows, MD;  Location: [facility] ENDOSCOPY;  Service: Gastroenterology;  Laterality: N/A;  . COLPOSCOPY  03/1998; 07/1999   2000-no lesion; 2001-CIN1  . ENDOMETRIAL BIOPSY  03/2007   proliferative    Medical History: Past Medical History:  Diagnosis Date  . ADD (attention deficit disorder)   . Anxiety   . Depression   . Frequent urination   . Headache   . Herpes genitalis   . Hypertension   . Stress headaches   . Stroke ([place]) ~2005    loss of peripheral vision  . Stroke or transient ischemic attack (TIA) diagnosed during current admission   . Urological disorder    bladder has dropped    Family History: Family History  Problem Relation Age of Onset  .  Hypertension Father   . Diabetes Father        Has also had an amputation  . Heart attack Father   . Diabetes Mellitus II Father   . Diabetes Mellitus II Brother   . Hypertension Brother   . Breast cancer Maternal Grandmother 60  . Diabetes Mellitus II Paternal Grandmother     Social History   Socioeconomic History  . Marital status: Legally Separated    Spouse name: Not on file  . Number of children: 1  . Years of education: 61  . Highest education level: Not on file  Occupational History  . Occupation: Corporate treasurer  Tobacco Use  . Smoking status: Never Smoker  . Smokeless tobacco: Never Used  Substance and Sexual Activity  . Alcohol use: No    Alcohol/week: 0.0 standard drinks  . Drug use: No  . Sexual activity: Yes    Birth control/protection: I.U.D.    Comment: Mirena  Other Topics Concern  . Not on file  Social History Narrative  . Not on file   Social Determinants of Health   Financial Resource Strain:   .  Difficulty of Paying Living Expenses: Not on file  Food Insecurity:   . Worried About Charity fundraiser in the Last Year: Not on file  . Ran Out of Food in the Last Year: Not on file  Transportation Needs:   . Lack of Transportation (Medical): Not on file  . Lack of Transportation (Non-Medical): Not on file  Physical Activity:   . Days of Exercise per Week: Not on file  . Minutes of Exercise per Session: Not on file  Stress:   . Feeling of Stress : Not on file  Social Connections:   . Frequency of Communication with Friends and Family: Not on file  . Frequency of Social Gatherings with Friends and Family: Not on file  . Attends Religious Services: Not on file  . Active Member of Clubs or Organizations: Not on file  . Attends Archivist Meetings: Not on file  . Marital Status: Not on file  Intimate Partner Violence:   . Fear of Current or Ex-Partner: Not on file  . Emotionally Abused: Not on file  . Physically Abused: Not on  file  . Sexually Abused: Not on file      Review of Systems  Constitutional: Positive for fatigue. Negative for activity change, chills and unexpected weight change.  HENT: Positive for congestion, ear pain, postnasal drip, rhinorrhea, sinus pressure and sinus pain. Negative for sneezing and sore throat.        Left ear and left sinus pain.   Respiratory: Negative for cough, chest tightness, shortness of breath and wheezing.   Cardiovascular: Negative for chest pain and palpitations.  Gastrointestinal: Negative for abdominal pain, constipation, diarrhea, nausea and vomiting.  Endocrine: Negative for cold intolerance, heat intolerance, polydipsia and polyuria.  Musculoskeletal: Negative for arthralgias, back pain, joint swelling, neck pain and neck stiffness.  Skin: Negative for rash.  Allergic/Immunologic: Positive for environmental allergies.  Neurological: Positive for headaches. Negative for dizziness, tremors, seizures, syncope, speech difficulty and numbness.       Headaches have improved in severity and frequency.  Hematological: Negative for adenopathy. Does not bruise/bleed easily.  Psychiatric/Behavioral: Positive for decreased concentration and sleep disturbance. Negative for behavioral problems. The patient is nervous/anxious.     Today's Vitals   04/24/19 1436  Weight: 165 lb (74.8 kg)  Height: 5\' 7"  (1.702 m)   Body mass index is 25.84 kg/m.  Observation/Objective:   The patient is alert and oriented. She is pleasant and answers all questions appropriately. Breathing is non-labored. She is in no acute distress at this time.    Assessment/Plan: 1. Acute otitis externa of left ear, unspecified type Try round of acetic acid-HC ear drops. Use four drops in both ears twice daily for next 7 days.  - acetic acid-hydrocortisone (VOSOL-HC) OTIC solution; Place 4 drops into the left ear 2 (two) times daily.  Dispense: 10 mL; Refill: 1  2. Acute recurrent  pansinusitis augmentin 875mg  twice daily for 10 days. Advised she rest and increase fluids. Use OTC medication as needed and as indicated to improve acute symptoms.  - amoxicillin-clavulanate (AUGMENTIN) 875-125 MG tablet; Take 1 tablet by mouth 2 (two) times daily.  Dispense: 20 tablet; Refill: 0  3. Attention and concentration deficit May continue to take adderall 20mg  twice daily as needed. Three 30 day prescriptions sent to her pharmacy. Dates are 04/24/2019, 05/23/2019, and 06/20/2019.  - amphetamine-dextroamphetamine (ADDERALL) 20 MG tablet; Take 1 tablet (20 mg total) by mouth 2 (two) times daily.  Dispense: 60  tablet; Refill: 0  4. GAD (generalized anxiety disorder) May take alprazolam 0.5mg  up to twice daily as needed for acute anxiety. Refills provided today.  - ALPRAZolam (XANAX) 0.5 MG tablet; Take 1 tablet (0.5 mg total) by mouth 2 (two) times daily as needed for anxiety.  Dispense: 60 tablet; Refill: 3  5. Primary insomnia Continue trazodone. Take 1/2 to 1 tablet at bedtime as needed for insomnia.  - traZODone (DESYREL) 50 MG tablet; Take 0.5-1 tablets (25-50 mg total) by mouth at bedtime as needed for sleep.  Dispense: 90 tablet; Refill: 1  General Counseling: Marquite verbalizes understanding of the findings of today's phone visit and agrees with plan of treatment. I have discussed any further diagnostic evaluation that may be needed or ordered today. We also reviewed her medications today. she has been encouraged to call the office with any questions or concerns that should arise related to todays visit.   Refilled Controlled medications today. Reviewed risks and possible side effects associated with taking Stimulants. Combination of these drugs with other psychotropic medications could cause dizziness and drowsiness. Pt needs to Monitor symptoms and exercise caution in driving and operating heavy machinery to avoid damages to oneself, to others and to the surroundings. Patient  verbalized understanding in this matter. Dependence and abuse for these drugs will be monitored closely. A Controlled substance policy and procedure is on file which allows [facility] to order a urine drug screen test at any visit. Patient understands and agrees with the plan..  This patient was seen by Leretha Pol FNP Collaboration with Dr Lavera Guise as a part of collaborative care agreement  Meds ordered this encounter  Medications  . acetic acid-hydrocortisone (VOSOL-HC) OTIC solution    Sig: Place 4 drops into the left ear 2 (two) times daily.    Dispense:  10 mL    Refill:  1    Order Specific Question:   Supervising Provider    Answer:   Lavera Guise [id number]  . amoxicillin-clavulanate (AUGMENTIN) 875-125 MG tablet    Sig: Take 1 tablet by mouth 2 (two) times daily.    Dispense:  20 tablet    Refill:  0    Order Specific Question:   Supervising Provider    Answer:   Lavera Guise [id number]  . DISCONTD: amphetamine-dextroamphetamine (ADDERALL) 20 MG tablet    Sig: Take 1 tablet (20 mg total) by mouth 2 (two) times daily.    Dispense:  60 tablet    Refill:  0    Order Specific Question:   Supervising Provider    Answer:   Lavera Guise [id number]  . ALPRAZolam (XANAX) 0.5 MG tablet    Sig: Take 1 tablet (0.5 mg total) by mouth 2 (two) times daily as needed for anxiety.    Dispense:  60 tablet    Refill:  3    Order Specific Question:   Supervising Provider    Answer:   Lavera Guise [id number]  . traZODone (DESYREL) 50 MG tablet    Sig: Take 0.5-1 tablets (25-50 mg total) by mouth at bedtime as needed for sleep.    Dispense:  90 tablet    Refill:  1    Order Specific Question:   Supervising Provider    Answer:   Lavera Guise [id number]  . DISCONTD: amphetamine-dextroamphetamine (ADDERALL) 20 MG tablet    Sig: Take 1 tablet (20 mg total) by mouth 2 (two) times daily.    Dispense:  60 tablet    Refill:  0    Fill after 05/23/2019    Order Specific Question:   Supervising  Provider    Answer:   Lavera Guise [id number]  . DISCONTD: amphetamine-dextroamphetamine (ADDERALL) 20 MG tablet    Sig: Take 1 tablet (20 mg total) by mouth 2 (two) times daily.    Dispense:  60 tablet    Refill:  0    Fill after 05/23/2019    Order Specific Question:   Supervising Provider    Answer:   Lavera Guise [id number]  . DISCONTD: amphetamine-dextroamphetamine (ADDERALL) 20 MG tablet    Sig: Take 1 tablet (20 mg total) by mouth 2 (two) times daily.    Dispense:  60 tablet    Refill:  0    Fill after 05/23/2019    Order Specific Question:   Supervising Provider    Answer:   Lavera Guise [id number]  . DISCONTD: amphetamine-dextroamphetamine (ADDERALL) 20 MG tablet    Sig: Take 1 tablet (20 mg total) by mouth 2 (two) times daily.    Dispense:  60 tablet    Refill:  0    Fill after 05/23/2019    Order Specific Question:   Supervising Provider    Answer:   Lavera Guise [id number]  . DISCONTD: amphetamine-dextroamphetamine (ADDERALL) 20 MG tablet    Sig: Take 1 tablet (20 mg total) by mouth 2 (two) times daily.    Dispense:  60 tablet    Refill:  0    Fill after 05/23/2019    Order Specific Question:   Supervising Provider    Answer:   Lavera Guise [id number]  . amphetamine-dextroamphetamine (ADDERALL) 20 MG tablet    Sig: Take 1 tablet (20 mg total) by mouth 2 (two) times daily.    Dispense:  60 tablet    Refill:  0    Fill after 06/20/2019    Order Specific Question:   Supervising Provider    Answer:   Lavera Guise [id number]    Time spent: 54 Minutes    Dr Lavera Guise Internal medicine

## 2019-04-26 DIAGNOSIS — H60502 Unspecified acute noninfective otitis externa, left ear: Secondary | ICD-10-CM | POA: Insufficient documentation

## 2019-05-12 ENCOUNTER — Ambulatory Visit
Admission: EM | Admit: 2019-05-12 | Discharge: 2019-05-12 | Disposition: A | Discharge status: Home / Self Care | Payer: BLUE CROSS/BLUE SHIELD | Attending: Family Medicine | Admitting: Family Medicine

## 2019-05-12 ENCOUNTER — Other Ambulatory Visit: Payer: Self-pay

## 2019-05-12 ENCOUNTER — Encounter: Payer: Self-pay | Admitting: Emergency Medicine

## 2019-05-12 DIAGNOSIS — Y93G1 Activity, food preparation and clean up: Secondary | ICD-10-CM | POA: Diagnosis not present

## 2019-05-12 DIAGNOSIS — Z23 Encounter for immunization: Secondary | ICD-10-CM | POA: Diagnosis not present

## 2019-05-12 DIAGNOSIS — S61213A Laceration without foreign body of left middle finger without damage to nail, initial encounter: Secondary | ICD-10-CM

## 2019-05-12 MED ORDER — LIDOCAINE-EPINEPHRINE-TETRACAINE (LET) TOPICAL GEL
3.0000 mL | Freq: Once | TOPICAL | Status: AC
  Administered 2019-05-12: 3 mL via TOPICAL

## 2019-05-12 MED ORDER — TETANUS-DIPHTH-ACELL PERTUSSIS 5-2.5-18.5 LF-MCG/0.5 IM SUSP
0.5000 mL | Freq: Once | INTRAMUSCULAR | Status: AC
  Administered 2019-05-12: 0.5 mL via INTRAMUSCULAR

## 2019-05-12 NOTE — ED Triage Notes (Signed)
Pt has laceration on her left middle finger occurred this morning about 7 am. She was cutting limes for her water.

## 2019-05-12 NOTE — ED Provider Notes (Signed)
[facility]    CSN: [id number] Arrival date & time: 05/12/19  1421      History   Chief Complaint Chief Complaint  Patient presents with  . Laceration    left middle finger    HPI Jillian Edwards is a 54 y.o. female.   54 yo female with a c/o laceration to her left middle finger that occurred this morning around 7am at home while cutting some limes.    Laceration   Past Medical History:  Diagnosis Date  . ADD (attention deficit disorder)   . Anxiety   . Depression   . Frequent urination   . Headache   . Herpes genitalis   . Hypertension   . Stress headaches   . Stroke ([place]) ~2005    loss of peripheral vision  . Stroke or transient ischemic attack (TIA) diagnosed during current admission   . Urological disorder    bladder has dropped    Patient Active Problem List   Diagnosis Date Noted  . Acute otitis externa of left ear 04/26/2019  . Encounter for long-term (current) use of medications 10/22/2018  . Primary insomnia 10/22/2018  . Migraine 07/23/2018  . Chronic pansinusitis 06/17/2018  . New daily persistent headache 06/03/2018  . History of stroke 06/03/2018  . Other fatigue 01/26/2018  . Unspecified menopausal and perimenopausal disorder 01/26/2018  . Acute recurrent pansinusitis 08/02/2017  . Edema of both feet 08/02/2017  . Attention and concentration deficit 05/04/2017  . GAD (generalized anxiety disorder) 05/04/2017  . Stroke ([place])   . Essential hypertension   . Herpes genitalis   . Depression   . Anxiety   . ADD (attention deficit disorder)   . Incontinence 08/01/2015  . Microscopic hematuria 08/01/2015  . Urinary frequency 08/01/2015  . Cystocele, grade 2 08/01/2015    Past Surgical History:  Procedure Laterality Date  . BREAST BIOPSY Left 02/06/2013   stereotactic biopsy of calcifications-benign  . BREAST ENHANCEMENT SURGERY  08/2003  . COLONOSCOPY WITH PROPOFOL N/A 12/14/2016   Procedure: COLONOSCOPY WITH PROPOFOL;   Surgeon: Jonathon Bellows, MD;  Location: [facility] ENDOSCOPY;  Service: Gastroenterology;  Laterality: N/A;  . COLPOSCOPY  03/1998; 07/1999   2000-no lesion; 2001-CIN1  . ENDOMETRIAL BIOPSY  03/2007   proliferative    OB History    Gravida  1   Para  1   Term  1   Preterm      AB      Living  1     SAB      TAB      Ectopic      Multiple      Live Births  1            Home Medications    Prior to Admission medications   Medication Sig Start Date End Date Taking? Authorizing Provider  ALPRAZolam Duanne Moron) 0.5 MG tablet Take 1 tablet (0.5 mg total) by mouth 2 (two) times daily as needed for anxiety. 04/24/19  Yes Boscia, Heather E, NP  amphetamine-dextroamphetamine (ADDERALL) 20 MG tablet Take 1 tablet (20 mg total) by mouth 2 (two) times daily. 04/24/19  Yes Ronnell Freshwater, NP  aspirin EC 81 MG tablet Take 81 mg by mouth daily.   Yes [provider]  buPROPion (WELLBUTRIN XL) 150 MG 24 hr tablet Take 2 tablets by mouth daily 01/14/19  Yes Boscia, Heather E, NP  butalbital-acetaminophen-caffeine (FIORICET, ESGIC) 50-325-40 MG tablet Take 1-2 tablets by mouth every 6 (six) hours  as needed for headache. 05/21/18 05/21/19 Yes Scar[place], Audie Clear, NP  calcium carbonate (TUMS - DOSED IN MG ELEMENTAL CALCIUM) 500 MG chewable tablet Chew 1 tablet by mouth daily.   Yes [provider]  cholecalciferol (VITAMIN D) 1000 units tablet Take 2,000 Units by mouth daily.    Yes [provider]  doxycycline (PERIOSTAT) 20 MG tablet  03/11/18  Yes [provider]  hydrochlorothiazide (HYDRODIURIL) 25 MG tablet Take 1 tablet (25 mg total) by mouth daily. Take 1 tablet QAM and 1/2 tablet QPM as needed 01/02/19  Yes Boscia, Heather E, NP  Ivermectin 1 % CREA  03/24/18  Yes [provider]  levonorgestrel (MIRENA) 20 MCG/24HR IUD 1 each by Intrauterine route once.   Yes [provider]  propranolol ER (INDERAL LA) 60 MG 24 hr capsule Take 1 capsule (60  mg total) by mouth daily. 02/04/19  Yes Boscia, Greer Ee, NP  SUMAtriptan (IMITREX) 50 MG tablet Take 1 tablet (50 mg total) by mouth every 2 (two) hours as needed for migraine. May repeat in 2 hours if headache persists or recurs. 10/22/18  Yes Ronnell Freshwater, NP  vitamin E 100 UNIT capsule Take 100 Units by mouth daily.   Yes [provider]  acetic acid-hydrocortisone (VOSOL-HC) OTIC solution Place 4 drops into the left ear 2 (two) times daily. 04/24/19   Ronnell Freshwater, NP  amoxicillin-clavulanate (AUGMENTIN) 875-125 MG tablet Take 1 tablet by mouth 2 (two) times daily. 04/24/19   Ronnell Freshwater, NP  lisdexamfetamine (VYVANSE) 50 MG capsule Take 1 capsule (50 mg total) by mouth every morning. Fill after 12/17/2018 01/20/19   Ronnell Freshwater, NP  meloxicam (MOBIC) 7.5 MG tablet Take 1 tablet (7.5 mg total) by mouth daily. 11/10/16   Dalia Heading, CNM  traZODone (DESYREL) 50 MG tablet Take 0.5-1 tablets (25-50 mg total) by mouth at bedtime as needed for sleep. 04/24/19   Ronnell Freshwater, NP    Family History Family History  Problem Relation Age of Onset  . Hypertension Father   . Diabetes Father        Has also had an amputation  . Heart attack Father   . Diabetes Mellitus II Father   . Diabetes Mellitus II Brother   . Hypertension Brother   . Breast cancer Maternal Grandmother 60  . Diabetes Mellitus II Paternal Grandmother     Social History Social History   Tobacco Use  . Smoking status: Never Smoker  . Smokeless tobacco: Never Used  Substance Use Topics  . Alcohol use: No    Alcohol/week: 0.0 standard drinks  . Drug use: No     Allergies   Patient has no known allergies.   Review of Systems Review of Systems   Physical Exam Triage Vital Signs ED Triage Vitals  Enc Vitals Group     BP 05/12/19 1457 (!) 121/91     Pulse Rate 05/12/19 1457 78     Resp 05/12/19 1457 18     Temp 05/12/19 1457 98.1 F (36.7 C)     Temp Source 05/12/19 1457  Oral     SpO2 05/12/19 1457 100 %     Weight 05/12/19 1458 165 lb (74.8 kg)     Height 05/12/19 1458 5\' 7"  (1.702 m)     Head Circumference --      Peak Flow --      Pain Score 05/12/19 1458 2     Pain Loc --  Pain Edu? --      Excl. in [place]? --    No data found.  Updated Vital Signs BP (!) 121/91 (BP Location: Right Arm)   Pulse 78   Temp 98.1 F (36.7 C) (Oral)   Resp 18   Ht 5\' 7"  (1.702 m)   Wt 74.8 kg   SpO2 100%   BMI 25.84 kg/m   Visual Acuity Right Eye Distance:   Left Eye Distance:   Bilateral Distance:    Right Eye Near:   Left Eye Near:    Bilateral Near:     Physical Exam Vitals and nursing note reviewed.  Constitutional:      General: She is not in acute distress.    Appearance: She is not toxic-appearing or diaphoretic.  Musculoskeletal:     Left hand: Laceration (distal palmar middle finger (v-shaped)) present. No swelling. Normal range of motion. Normal strength. There is no disruption of two-point discrimination. Normal pulse.     Comments: Hand neurovascularly intact  Neurological:     Mental Status: She is alert.      UC Treatments / Results  Labs (all labs ordered are listed, but only abnormal results are displayed) Labs Reviewed - No data to display  EKG   Radiology No results found.  Procedures Laceration Repair  Date/Time: 05/12/2019 6:24 PM Performed by: Norval Gable, MD Authorized by: Norval Gable, MD   Consent:    Consent obtained:  Verbal   Consent given by:  Patient   Risks discussed:  Infection, need for additional repair, nerve damage, poor wound healing, poor cosmetic result, pain, retained foreign body, tendon damage and vascular damage   Alternatives discussed:  No treatment Anesthesia (see MAR for exact dosages):    Anesthesia method:  Local infiltration   Local anesthetic:  Lidocaine 1% w/o epi Laceration details:    Location:  Finger   Finger location:  L long finger   Length (cm):  1.5 Repair type:     Repair type:  Simple Pre-procedure details:    Preparation:  Patient was prepped and draped in usual sterile fashion Exploration:    Hemostasis achieved with:  LET and direct pressure   Wound exploration: wound explored through full range of motion and entire depth of wound probed and visualized     Wound extent: areolar tissue violated     Contaminated: no   Treatment:    Area cleansed with:  Betadine   Amount of cleaning:  Standard   Irrigation solution:  Sterile water   Irrigation method:  Syringe   Foreign body removal: no foreign bodies visualized.   Skin repair:    Repair method:  Sutures   Suture size:  5-0   Suture material:  Nylon   Suture technique:  Simple interrupted   Number of sutures:  5 Approximation:    Approximation:  Close Post-procedure details:    Dressing:  Antibiotic ointment, splint for protection and non-adherent dressing   Patient tolerance of procedure:  Tolerated well, no immediate complications   (including critical care time)  Medications Ordered in UC Medications  Tdap (BOOSTRIX) injection 0.5 mL (0.5 mLs Intramuscular Given 05/12/19 1504)  lidocaine-EPINEPHrine-tetracaine (LET) topical gel (3 mLs Topical Given 05/12/19 1520)    Initial Impression / Assessment and Plan / UC Course  I have reviewed the triage vital signs and the nursing notes.  Pertinent labs & imaging results that were available during my care of the patient were reviewed by me and considered in  my medical decision making (see chart for details).      Final Clinical Impressions(s) / UC Diagnoses   Final diagnoses:  Laceration of left middle finger without foreign body without damage to nail, initial encounter     Discharge Instructions     Wound care as explained Follow up in 7 days for suture removal or sooner if any problems    ED Prescriptions    None      1. diagnosis reviewed with patient; given Tdap 2. Procedure as per note above 3. Recommend  supportive treatment with routine wound care 4. Follow-up in 7-8 days for suture removal or sooner prn   PDMP not reviewed this encounter.   Norval Gable, MD 05/12/19 [phone]

## 2019-05-12 NOTE — Discharge Instructions (Signed)
Wound care as explained Follow up in 7 days for suture removal or sooner if any problems

## 2019-06-01 NOTE — Progress Notes (Signed)
Gynecology Annual Exam  PCP: Ronnell Freshwater, NP  Chief Complaint:  Chief Complaint  Patient presents with  . Gynecologic Exam    History of Present Illness:Jillian Edwards is a 54 year old Caucasian/White female, G1 P1001, who presents for her annual exam. She has had no menses in over a year. LMP 04/2018? Has hot flashes which are +/- relieved with vitamin E.She has been having dyspareunia (abdominal pain) and decreased libido. Uses lubricants at times for vaginal dryness.  The patient's past medical history is notable for a history of a stroke in 2006, depression/anxiety, hypertension, a remote hx of CIN I, and HSVII. She uses Valtrex episodically, has not needed in a while. Hx of migraines. Takes inderal for prophylaxis and sumatriptan when needed.  Since her last annual GYN exam dated 05/29/2018 , she has had no other significant changes in her health. She is sexually active. She is currently using an IUD for contraception. Mirena IUD was placed on 04/30/2012. Last year she had an elevated FSH and LH. Plan is to remove Mirena today. Her most recent pap smear was obtained 05/29/2018 and was NIL/negative.  Her most recent mammogram on file was 04/17/19 and was benign. Has her mammograms at [facility].  Hx of breast augmentation in 2005 and a stereotactic biopsy of calcifications in 01/2013 which was benign.  There is a positive history of breast cancer in her maternal grandmother. Genetic testing has not been done.  There is no family history of ovarian cancer.  The patient does  do monthly self breast exams.  She had a colonoscopy 12/14/2016 which was negative. The patient does not smoke.  The patient does not drink alcohol.  The patient does not use illegal drugs.  The patient exercises three times a week doing HIIT classes The patient does get adequate calcium in her diet and with her Tums supplement  She had a normal lipid panel in 2019 at her PCP Dr Chancy Milroy Nira [facility].   Review  of Systems: Review of Systems  Constitutional: Negative for chills, fever and weight loss.  HENT: Positive for congestion. Negative for sinus pain and sore throat.   Eyes: Negative for blurred vision and pain.  Respiratory: Negative for hemoptysis, shortness of breath and wheezing.   Cardiovascular: Negative for chest pain, palpitations and leg swelling.  Gastrointestinal: Negative for abdominal pain, blood in stool, constipation, diarrhea, heartburn, nausea and vomiting.  Genitourinary: Negative for dysuria, frequency, hematuria and urgency.       Positive for amenorrhea and  Dyspareunia and decreased libido  Musculoskeletal: Negative for back pain, joint pain and myalgias.  Skin: Negative for itching and rash.  Neurological: Positive for headaches. Negative for dizziness and tingling.  Endo/Heme/Allergies: Negative for environmental allergies and polydipsia. Does not bruise/bleed easily.       Marland Kitchen   Psychiatric/Behavioral: Positive for depression. The patient has insomnia. The patient is not nervous/anxious.     Past Medical History:  Past Medical History:  Diagnosis Date  . ADD (attention deficit disorder)   . Anxiety   . Depression   . Frequent urination   . Headache   . Herpes genitalis   . Hypertension   . Stress headaches   . Stroke ([place]) ~2005    loss of peripheral vision  . Stroke or transient ischemic attack (TIA) diagnosed during current admission   . Urological disorder    bladder has dropped    Past Surgical History:  Past Surgical History:  Procedure Laterality Date  . BREAST BIOPSY Left 02/06/2013   stereotactic biopsy of calcifications-benign  . BREAST ENHANCEMENT SURGERY  08/2003  . COLONOSCOPY WITH PROPOFOL N/A 12/14/2016   Procedure: COLONOSCOPY WITH PROPOFOL;  Surgeon: Jonathon Bellows, MD;  Location: [facility] ENDOSCOPY;  Service: Gastroenterology;  Laterality: N/A;  . COLPOSCOPY  03/1998; 07/1999   2000-no lesion; 2001-CIN1  . ENDOMETRIAL BIOPSY  03/2007    proliferative    Family History:  Family History  Problem Relation Age of Onset  . Hypertension Father   . Diabetes Father        Has also had an amputation  . Heart attack Father   . Diabetes Mellitus II Father   . Diabetes Mellitus II Brother   . Hypertension Brother   . Breast cancer Maternal Grandmother 60  . Diabetes Mellitus II Paternal Grandmother     Social History:  Social History   Socioeconomic History  . Marital status: Legally Separated    Spouse name: Not on file  . Number of children: 1  . Years of education: 53  . Highest education level: Not on file  Occupational History  . Occupation: Corporate treasurer  Tobacco Use  . Smoking status: Never Smoker  . Smokeless tobacco: Never Used  Substance and Sexual Activity  . Alcohol use: No    Alcohol/week: 0.0 standard drinks  . Drug use: No  . Sexual activity: Yes    Birth control/protection: I.U.D.    Comment: Mirena  Other Topics Concern  . Not on file  Social History Narrative  . Not on file   Social Determinants of Health   Financial Resource Strain:   . Difficulty of Paying Living Expenses: Not on file  Food Insecurity:   . Worried About Charity fundraiser in the Last Year: Not on file  . Ran Out of Food in the Last Year: Not on file  Transportation Needs:   . Lack of Transportation (Medical): Not on file  . Lack of Transportation (Non-Medical): Not on file  Physical Activity:   . Days of Exercise per Week: Not on file  . Minutes of Exercise per Session: Not on file  Stress:   . Feeling of Stress : Not on file  Social Connections:   . Frequency of Communication with Friends and Family: Not on file  . Frequency of Social Gatherings with Friends and Family: Not on file  . Attends Religious Services: Not on file  . Active Member of Clubs or Organizations: Not on file  . Attends Archivist Meetings: Not on file  . Marital Status: Not on file  Intimate Partner Violence:   .  Fear of Current or Ex-Partner: Not on file  . Emotionally Abused: Not on file  . Physically Abused: Not on file  . Sexually Abused: Not on file    Allergies:  No Known Allergies  Medications: Current Outpatient Medications:  .  ALPRAZolam (XANAX) 0.5 MG tablet, Take 1 tablet (0.5 mg total) by mouth 2 (two) times daily as needed for anxiety., Disp: 60 tablet, Rfl: 3 .  aspirin EC 81 MG tablet, Take 81 mg by mouth daily., Disp: , Rfl:  .  buPROPion (WELLBUTRIN XL) 150 MG 24 hr tablet, Take 2 tablets by mouth daily, Disp: 180 tablet, Rfl: 1 .  butalbital-acetaminophen-caffeine (FIORICET, ESGIC) 50-325-40 MG tablet, Take 1-2 tablets by mouth every 6 (six) hours as needed for headache., Disp: 20 tablet, Rfl: 0 .  calcium carbonate (TUMS - DOSED IN MG  ELEMENTAL CALCIUM) 500 MG chewable tablet, Chew 1 tablet by mouth daily., Disp: , Rfl:  .  cholecalciferol (VITAMIN D) 1000 units tablet, Take 2,000 Units by mouth daily. , Disp: , Rfl:  .  doxycycline (PERIOSTAT) 20 MG tablet, , Disp: , Rfl:  .  hydrochlorothiazide (HYDRODIURIL) 25 MG tablet, Take 1 tablet (25 mg total) by mouth daily. Take 1 tablet QAM and 1/2 tablet QPM as needed, Disp: 45 tablet, Rfl: 3 .  Ivermectin 1 % CREA, , Disp: , Rfl:  .  levonorgestrel (MIRENA) 20 MCG/24HR IUD, 1 each by Intrauterine route once., Disp: , Rfl:  .  montelukast (SINGULAIR) 10 MG tablet, Take 10 mg by mouth every evening., Disp: , Rfl:  .  SUMAtriptan (IMITREX) 50 MG tablet, Take 50 mg by mouth every 2 (two) hours as needed for migraine. May repeat in 2 hours if headache persists or recurs., Disp: , Rfl:   Physical Exam Vitals: BP 120/70   Pulse 66   Ht 5\' 7"  (1.702 m)   Wt 168 lb (76.2 kg)   BMI 26.31 kg/m   General: WF in NAD HEENT: normocephalic, anicteric Neck: no thyroid enlargement, no palpable nodules, no cervical lymphadenopathy  Pulmonary: No increased work of breathing, CTAB Cardiovascular: RRR, without murmur  Breast: Breast  symmetrical, s/p breast augmentation, no tenderness, no palpable nodules or masses, no skin or nipple retraction present, no nipple discharge.  No axillary, infraclavicular or supraclavicular lymphadenopathy. Abdomen: Soft, non-tender, non-distended.  Umbilicus without lesions.  No hepatomegaly or masses palpable. No evidence of hernia. Genitourinary:  External: Normal external female genitalia.  Normal urethral meatus, normal Bartholin's and Skene's glands.    Vagina: Normal vaginal mucosa, white mucoepithelial discharge   Cervix:: small polyp at os, no bleeding, non-tender, IUD string present. Polyp removed. AgNO3 applied for hemostasis  Uterus: Anteverted, normal size, shape, and consistency, mobile, and non-tender  Adnexa: No adnexal masses, non-tender  Rectal: deferred  Lymphatic: no evidence of inguinal lymphadenopathy Extremities: no edema, erythema, or tenderness Neurologic: Grossly intact Psychiatric: mood appropriate, affect full.   Assessment: 54 y.o. annual gyn exam Vasomotor symptoms Dyspareunia and decreased libido   Plan:    1) Breast cancer screening - recommend monthly self breast exam and annual mammograms. Mammogram is UTD  2) Colon cancer screen- normal colonoscopy 2018. Next due 2028  3) Cervical cancer screening - Pap was not done. ASCCP guidelines and rational discussed.  Patient opts for every 3 year Pap  screening interval. Endocervical polyp sent for pathology  4) Contraception -Mirena in place x 7 years..  FSH, LH in menopausal range last year. Recommended removing IUD. No further contraception should be needed. If menses resumes after IUD removed, use condoms and consider another form of BC. See procedure note below.  5) Routine healthcare maintenance including cholesterol and diabetes screening managed by PCP   6) Discussed waiting to see if the dyspareunia decreased after the IUD was removed. Continue use of lubricants as needed.  7) RTO 1 year and  prn.  Dalia Heading, CNM    History of Present Illness:  LAIKYN MCPHAUL is a 54 y.o. that had a Mirena IUD placed approximately 7 years ago.   Pelvic exam:  Two IUD strings present seen coming from the cervical os. EGBUS, vaginal vault and cervix: within normal limits  IUD Removal Strings of IUD identified and grasped.  IUD removed without problem.  Pt tolerated this well.  IUD noted to be intact.  Assessment: IUD Removal  Plan: IUD removed and plan for contraception is no method (menopausal). She was amenable to this plan.  06/02/2019 Dalia Heading, CNM

## 2019-06-02 ENCOUNTER — Other Ambulatory Visit: Payer: Self-pay

## 2019-06-02 ENCOUNTER — Other Ambulatory Visit (HOSPITAL_COMMUNITY)
Admission: RE | Admit: 2019-06-02 | Discharge: 2019-06-02 | Disposition: A | Discharge status: Home / Self Care | Payer: BLUE CROSS/BLUE SHIELD | Source: Ambulatory Visit | Attending: Certified Nurse Midwife | Admitting: Certified Nurse Midwife

## 2019-06-02 ENCOUNTER — Encounter: Payer: Self-pay | Admitting: Certified Nurse Midwife

## 2019-06-02 ENCOUNTER — Ambulatory Visit (INDEPENDENT_AMBULATORY_CARE_PROVIDER_SITE_OTHER): Payer: BLUE CROSS/BLUE SHIELD | Admitting: Certified Nurse Midwife

## 2019-06-02 VITALS — BP 120/70 | HR 66 | Ht 67.0 in | Wt 168.0 lb

## 2019-06-02 DIAGNOSIS — Z30432 Encounter for removal of intrauterine contraceptive device: Secondary | ICD-10-CM

## 2019-06-02 DIAGNOSIS — Z01411 Encounter for gynecological examination (general) (routine) with abnormal findings: Secondary | ICD-10-CM | POA: Diagnosis not present

## 2019-06-02 DIAGNOSIS — N841 Polyp of cervix uteri: Secondary | ICD-10-CM | POA: Diagnosis not present

## 2019-06-02 DIAGNOSIS — N941 Unspecified dyspareunia: Secondary | ICD-10-CM

## 2019-06-02 DIAGNOSIS — F5101 Primary insomnia: Secondary | ICD-10-CM | POA: Diagnosis not present

## 2019-06-02 DIAGNOSIS — N951 Menopausal and female climacteric states: Secondary | ICD-10-CM

## 2019-06-02 DIAGNOSIS — Z01419 Encounter for gynecological examination (general) (routine) without abnormal findings: Secondary | ICD-10-CM

## 2019-06-02 DIAGNOSIS — R6882 Decreased libido: Secondary | ICD-10-CM | POA: Diagnosis not present

## 2019-06-03 LAB — SURGICAL PATHOLOGY

## 2019-06-05 ENCOUNTER — Encounter: Payer: Self-pay | Admitting: Nurse Practitioner

## 2019-06-09 ENCOUNTER — Other Ambulatory Visit: Payer: Self-pay

## 2019-06-09 DIAGNOSIS — G4452 New daily persistent headache (NDPH): Secondary | ICD-10-CM

## 2019-06-09 MED ORDER — SUMATRIPTAN SUCCINATE 50 MG PO TABS: 50.0000 mg | ORAL_TABLET | ORAL | 3 refills | Status: DC | PRN

## 2019-06-25 DIAGNOSIS — H47323 Drusen of optic disc, bilateral: Secondary | ICD-10-CM | POA: Diagnosis not present

## 2019-07-03 ENCOUNTER — Other Ambulatory Visit: Payer: Self-pay | Admitting: Dermatology

## 2019-07-14 ENCOUNTER — Other Ambulatory Visit: Payer: Self-pay

## 2019-07-14 DIAGNOSIS — I10 Essential (primary) hypertension: Secondary | ICD-10-CM

## 2019-07-14 MED ORDER — PROPRANOLOL HCL ER 60 MG PO CP24: 60.0000 mg | ORAL_CAPSULE | Freq: Every day | ORAL | 3 refills | Status: DC

## 2019-07-15 ENCOUNTER — Other Ambulatory Visit: Payer: Self-pay

## 2019-07-15 DIAGNOSIS — F411 Generalized anxiety disorder: Secondary | ICD-10-CM

## 2019-07-15 MED ORDER — BUPROPION HCL ER (XL) 150 MG PO TB24: ORAL_TABLET | ORAL | 1 refills | Status: DC

## 2019-07-22 ENCOUNTER — Telehealth: Payer: Self-pay

## 2019-07-22 NOTE — Telephone Encounter (Signed)
Confirmed and screened for 07-24-19 ov. 

## 2019-07-24 ENCOUNTER — Ambulatory Visit: Payer: BLUE CROSS/BLUE SHIELD | Admitting: Nurse Practitioner

## 2019-07-24 ENCOUNTER — Other Ambulatory Visit: Payer: Self-pay

## 2019-07-24 ENCOUNTER — Encounter: Payer: Self-pay | Admitting: Nurse Practitioner

## 2019-07-24 VITALS — BP 138/81 | HR 72 | Temp 97.4°F | Resp 16 | Ht 67.0 in | Wt 165.6 lb

## 2019-07-24 DIAGNOSIS — F411 Generalized anxiety disorder: Secondary | ICD-10-CM

## 2019-07-24 DIAGNOSIS — N959 Unspecified menopausal and perimenopausal disorder: Secondary | ICD-10-CM | POA: Diagnosis not present

## 2019-07-24 DIAGNOSIS — R41840 Attention and concentration deficit: Secondary | ICD-10-CM | POA: Diagnosis not present

## 2019-07-24 DIAGNOSIS — I10 Essential (primary) hypertension: Secondary | ICD-10-CM | POA: Diagnosis not present

## 2019-07-24 DIAGNOSIS — Z79899 Other long term (current) drug therapy: Secondary | ICD-10-CM

## 2019-07-24 LAB — POCT URINE DRUG SCREEN
POC Amphetamine UR: POSITIVE — AB
POC BENZODIAZEPINES UR: NOT DETECTED
POC Barbiturate UR: NOT DETECTED
POC Cocaine UR: NOT DETECTED
POC Ecstasy UR: NOT DETECTED
POC Marijuana UR: NOT DETECTED
POC Methadone UR: NOT DETECTED
POC Methamphetamine UR: NOT DETECTED
POC Opiate Ur: NOT DETECTED
POC Oxycodone UR: NOT DETECTED
POC PHENCYCLIDINE UR: NOT DETECTED
POC TRICYCLICS UR: NOT DETECTED

## 2019-07-24 MED ORDER — AMPHETAMINE-DEXTROAMPHETAMINE 30 MG PO TABS: 30.0000 mg | ORAL_TABLET | Freq: Two times a day (BID) | ORAL | 0 refills | Status: DC

## 2019-07-24 MED ORDER — ALPRAZOLAM 0.5 MG PO TABS: 0.5000 mg | ORAL_TABLET | Freq: Two times a day (BID) | ORAL | 3 refills | Status: DC | PRN

## 2019-07-24 MED ORDER — HYDROCHLOROTHIAZIDE 25 MG PO TABS: 25.0000 mg | ORAL_TABLET | Freq: Every day | ORAL | 3 refills | Status: DC

## 2019-07-24 NOTE — Progress Notes (Signed)
[facility] [place], [place] [address]  Internal MEDICINE  Office Visit Note  Patient Name: Jillian Edwards  [id number]  [id number]  Date of Service: 08/06/2019  Chief Complaint  Patient presents with  . ADD  . Hypertension    The patient is here for follow up visit. She did have her Mirena IUD removed per her GYN a few months ago. She has not had menstrual cycle. She states that she has severe hot flashes and is having trouble sleeping at night. The patient does have history of mild stroke, and it is contraindicated for her to take any synthetic hormones.  She states that she is fatigued all the time. Started taking multivitamin for women which has not made much of a difference. She is overdue to have routine check of lab work.       Current Medication: Outpatient Encounter Medications as of 07/24/2019  Medication Sig Note  . ALPRAZolam (XANAX) 0.5 MG tablet Take 1 tablet (0.5 mg total) by mouth 2 (two) times daily as needed for anxiety.   Marland Kitchen amphetamine-dextroamphetamine (ADDERALL) 30 MG tablet Take 1 tablet by mouth 2 (two) times daily.   Marland Kitchen aspirin EC 81 MG tablet Take 81 mg by mouth daily.   Marland Kitchen buPROPion (WELLBUTRIN XL) 150 MG 24 hr tablet Take 2 tablets by mouth daily   . calcium carbonate (TUMS - DOSED IN MG ELEMENTAL CALCIUM) 500 MG chewable tablet Chew 1 tablet by mouth daily.   . cholecalciferol (VITAMIN D) 1000 units tablet Take 2,000 Units by mouth daily.    Marland Kitchen doxycycline (PERIOSTAT) 20 MG tablet TAKE 1 TABLET BY MOUTH TWICE A DAY   . hydrochlorothiazide (HYDRODIURIL) 25 MG tablet Take 1 tablet (25 mg total) by mouth daily. Take 1 tablet QAM and 1/2 tablet QPM as needed   . Ivermectin 1 % CREA    . mometasone (ELOCON) 0.1 % cream Apply 1 application topically as needed.   . propranolol ER (INDERAL LA) 60 MG 24 hr capsule Take 1 capsule (60 mg total) by mouth daily.   . SUMAtriptan (IMITREX) 50 MG tablet Take 1 tablet (50 mg total) by mouth every 2  (two) hours as needed for migraine. May repeat in 2 hours if headache persists or recurs.   . vitamin E 180 MG (400 UNITS) capsule Take 1 capsule by mouth daily.   . [DISCONTINUED] ALPRAZolam (XANAX) 0.5 MG tablet Take 1 tablet (0.5 mg total) by mouth 2 (two) times daily as needed for anxiety.   . [DISCONTINUED] amphetamine-dextroamphetamine (ADDERALL) 20 MG tablet Take 1 tablet (20 mg total) by mouth 2 (two) times daily.   . [DISCONTINUED] amphetamine-dextroamphetamine (ADDERALL) 30 MG tablet Take 1 tablet by mouth 2 (two) times daily.   . [DISCONTINUED] amphetamine-dextroamphetamine (ADDERALL) 30 MG tablet Take 1 tablet by mouth 2 (two) times daily.   . [DISCONTINUED] hydrochlorothiazide (HYDRODIURIL) 25 MG tablet Take 1 tablet (25 mg total) by mouth daily. Take 1 tablet QAM and 1/2 tablet QPM as needed   . [DISCONTINUED] levonorgestrel (MIRENA) 20 MCG/24HR IUD 1 each by Intrauterine route once. 07/24/2019: removed per GYN provider   No facility-administered encounter medications on file as of 07/24/2019.    Surgical History: Past Surgical History:  Procedure Laterality Date  . BREAST BIOPSY Left 02/06/2013   stereotactic biopsy of calcifications-benign  . BREAST ENHANCEMENT SURGERY  08/2003  . COLONOSCOPY WITH PROPOFOL N/A 12/14/2016   Procedure: COLONOSCOPY WITH PROPOFOL;  Surgeon: Jonathon Bellows, MD;  Location: [facility]  ENDOSCOPY;  Service: Gastroenterology;  Laterality: N/A;  . COLPOSCOPY  03/1998; 07/1999   2000-no lesion; 2001-CIN1  . ENDOMETRIAL BIOPSY  03/2007   proliferative    Medical History: Past Medical History:  Diagnosis Date  . ADD (attention deficit disorder)   . Anxiety   . Depression   . Frequent urination   . Headache   . Herpes genitalis   . Hypertension   . Stress headaches   . Stroke ([place]) ~2005    loss of peripheral vision  . Stroke or transient ischemic attack (TIA) diagnosed during current admission   . Urological disorder    bladder has dropped    Family  History: Family History  Problem Relation Age of Onset  . Hypertension Father   . Diabetes Father        Has also had an amputation  . Heart attack Father   . Diabetes Mellitus II Father   . Diabetes Mellitus II Brother   . Hypertension Brother   . Breast cancer Maternal Grandmother 60  . Diabetes Mellitus II Paternal Grandmother     Social History   Socioeconomic History  . Marital status: Legally Separated    Spouse name: Not on file  . Number of children: 1  . Years of education: 69  . Highest education level: Not on file  Occupational History  . Occupation: Corporate treasurer  Tobacco Use  . Smoking status: Never Smoker  . Smokeless tobacco: Never Used  Substance and Sexual Activity  . Alcohol use: No    Alcohol/week: 0.0 standard drinks  . Drug use: No  . Sexual activity: Yes    Birth control/protection: I.U.D.    Comment: Mirena  Other Topics Concern  . Not on file  Social History Narrative  . Not on file   Social Determinants of Health   Financial Resource Strain:   . Difficulty of Paying Living Expenses:   Food Insecurity:   . Worried About Charity fundraiser in the Last Year:   . Arboriculturist in the Last Year:   Transportation Needs:   . Film/video editor (Medical):   Marland Kitchen Lack of Transportation (Non-Medical):   Physical Activity:   . Days of Exercise per Week:   . Minutes of Exercise per Session:   Stress:   . Feeling of Stress :   Social Connections:   . Frequency of Communication with Friends and Family:   . Frequency of Social Gatherings with Friends and Family:   . Attends Religious Services:   . Active Member of Clubs or Organizations:   . Attends Archivist Meetings:   Marland Kitchen Marital Status:   Intimate Partner Violence:   . Fear of Current or Ex-Partner:   . Emotionally Abused:   Marland Kitchen Physically Abused:   . Sexually Abused:       Review of Systems  Constitutional: Negative for activity change, chills, fatigue and  unexpected weight change.  HENT: Negative for congestion, postnasal drip, rhinorrhea, sneezing and sore throat.   Respiratory: Negative for cough, chest tightness, shortness of breath and wheezing.   Cardiovascular: Negative for chest pain and palpitations.  Gastrointestinal: Negative for abdominal pain, constipation, diarrhea, nausea and vomiting.  Endocrine: Positive for heat intolerance. Negative for cold intolerance, polydipsia and polyuria.  Genitourinary: Negative for dysuria.  Musculoskeletal: Negative for arthralgias, back pain, joint swelling and neck pain.  Skin: Negative for rash.  Allergic/Immunologic: Negative for environmental allergies.  Neurological: Negative for dizziness, tremors, numbness and  headaches.  Hematological: Negative for adenopathy. Does not bruise/bleed easily.  Psychiatric/Behavioral: Positive for dysphoric mood. Negative for behavioral problems (Depression), sleep disturbance and suicidal ideas. The patient is nervous/anxious.     Today's Vitals   07/24/19 1451  BP: 138/81  Pulse: 72  Resp: 16  Temp: (!) 97.4 F (36.3 C)  SpO2: 98%  Weight: 165 lb 9.6 oz (75.1 kg)   Body mass index is 25.94 kg/m.  Physical Exam Vitals and nursing note reviewed.  Constitutional:      Appearance: Normal appearance. She is well-developed. She is not diaphoretic.  HENT:     Head: Normocephalic and atraumatic.     Mouth/Throat:     Pharynx: No oropharyngeal exudate.  Eyes:     Extraocular Movements: Extraocular movements intact.     Conjunctiva/sclera: Conjunctivae normal.     Pupils: Pupils are equal, round, and reactive to light.  Neck:     Thyroid: No thyromegaly.     Vascular: No carotid bruit or JVD.     Trachea: No tracheal deviation.  Cardiovascular:     Rate and Rhythm: Normal rate and regular rhythm.     Heart sounds: Normal heart sounds. No murmur. No friction rub. No gallop.   Pulmonary:     Effort: Pulmonary effort is normal. No respiratory  distress.     Breath sounds: Normal breath sounds. No wheezing or rales.  Chest:     Chest wall: No tenderness.  Abdominal:     Palpations: Abdomen is soft.  Musculoskeletal:        General: Normal range of motion.     Cervical back: Normal range of motion and neck supple.  Lymphadenopathy:     Cervical: No cervical adenopathy.  Skin:    General: Skin is warm and dry.  Neurological:     General: No focal deficit present.     Mental Status: She is alert and oriented to person, place, and time.     Cranial Nerves: No cranial nerve deficit.  Psychiatric:        Attention and Perception: Attention and perception normal.        Mood and Affect: Affect normal. Mood is anxious.        Speech: Speech normal.        Behavior: Behavior normal. Behavior is cooperative.        Thought Content: Thought content normal.        Cognition and Memory: Cognition and memory normal.        Judgment: Judgment normal.    Assessment/Plan: 1. Essential hypertension Blood pressure stable. Continue HCTZ as prescribed.  - hydrochlorothiazide (HYDRODIURIL) 25 MG tablet; Take 1 tablet (25 mg total) by mouth daily. Take 1 tablet QAM and 1/2 tablet QPM as needed  Dispense: 45 tablet; Refill: 3  2. Attention and concentration deficit Increase adderall to 30mg  up to twice daily as needed for focus and concentration. Three 30 day prescriptions sent to her pharmacy. Dates are 07/24/2019, 08/21/2019 and 09/19/2019 - amphetamine-dextroamphetamine (ADDERALL) 30 MG tablet; Take 1 tablet by mouth 2 (two) times daily.  Dispense: 60 tablet; Refill: 0  3. Unspecified menopausal and perimenopausal disorder Check reproductive hormones with new labs.   4. GAD (generalized anxiety disorder) May take alprazolam 0.5mg  up to twice daily as needed for acute anxiety. New prescription sent to her pharmacy.  - ALPRAZolam (XANAX) 0.5 MG tablet; Take 1 tablet (0.5 mg total) by mouth 2 (two) times daily as needed for anxiety.  Dispense: 60 tablet; Refill: 3  5. Encounter for long-term (current) use of medications - POCT Urine Drug Screen appropriately positive for AMP only.   General Counseling: Anastasha verbalizes understanding of the findings of todays visit and agrees with plan of treatment. I have discussed any further diagnostic evaluation that may be needed or ordered today. We also reviewed her medications today. she has been encouraged to call the office with any questions or concerns that should arise related to todays visit.  Refilled Controlled medications today. Reviewed risks and possible side effects associated with taking Stimulants. Combination of these drugs with other psychotropic medications could cause dizziness and drowsiness. Pt needs to Monitor symptoms and exercise caution in driving and operating heavy machinery to avoid damages to oneself, to others and to the surroundings. Patient verbalized understanding in this matter. Dependence and abuse for these drugs will be monitored closely. A Controlled substance policy and procedure is on file which allows [facility] to order a urine drug screen test at any visit. Patient understands and agrees with the plan..  This patient was seen by Leretha Pol FNP Collaboration with Dr Lavera Guise as a part of collaborative care agreement  Orders Placed This Encounter  Procedures  . POCT Urine Drug Screen    Meds ordered this encounter  Medications  . ALPRAZolam (XANAX) 0.5 MG tablet    Sig: Take 1 tablet (0.5 mg total) by mouth 2 (two) times daily as needed for anxiety.    Dispense:  60 tablet    Refill:  3    Order Specific Question:   Supervising Provider    Answer:   Lavera Guise [id number]  . DISCONTD: amphetamine-dextroamphetamine (ADDERALL) 30 MG tablet    Sig: Take 1 tablet by mouth 2 (two) times daily.    Dispense:  60 tablet    Refill:  0    Order Specific Question:   Supervising Provider    Answer:   Lavera Guise [id number]  .  hydrochlorothiazide (HYDRODIURIL) 25 MG tablet    Sig: Take 1 tablet (25 mg total) by mouth daily. Take 1 tablet QAM and 1/2 tablet QPM as needed    Dispense:  45 tablet    Refill:  3    Order Specific Question:   Supervising Provider    Answer:   Lavera Guise [place]  . DISCONTD: amphetamine-dextroamphetamine (ADDERALL) 30 MG tablet    Sig: Take 1 tablet by mouth 2 (two) times daily.    Dispense:  60 tablet    Refill:  0    Fill after 08/21/2019    Order Specific Question:   Supervising Provider    Answer:   Lavera Guise [id number]  . amphetamine-dextroamphetamine (ADDERALL) 30 MG tablet    Sig: Take 1 tablet by mouth 2 (two) times daily.    Dispense:  60 tablet    Refill:  0    Fill after 09/19/2019    Order Specific Question:   Supervising Provider    Answer:   Lavera Guise [id number]    Total time spent: 30 Minutes   Time spent includes review of chart, medications, test results, and follow up plan with the patient.      Dr Lavera Guise Internal medicine

## 2019-08-15 ENCOUNTER — Encounter: Payer: Self-pay | Admitting: Nurse Practitioner

## 2019-08-15 ENCOUNTER — Other Ambulatory Visit: Payer: Self-pay | Admitting: Nurse Practitioner

## 2019-08-15 DIAGNOSIS — J0141 Acute recurrent pansinusitis: Secondary | ICD-10-CM

## 2019-08-15 MED ORDER — AMOXICILLIN-POT CLAVULANATE 875-125 MG PO TABS: 1.0000 | ORAL_TABLET | Freq: Two times a day (BID) | ORAL | 0 refills | Status: DC

## 2019-08-15 NOTE — Progress Notes (Signed)
augmentin 875mg  twice daily for next 10 days. Rest and increase fluids. Take OTC medications as needed and as indicated for acute symptoms.

## 2019-10-06 ENCOUNTER — Other Ambulatory Visit: Payer: Self-pay | Admitting: Nurse Practitioner

## 2019-10-06 ENCOUNTER — Encounter: Payer: Self-pay | Admitting: Nurse Practitioner

## 2019-10-06 DIAGNOSIS — B372 Candidiasis of skin and nail: Secondary | ICD-10-CM

## 2019-10-06 DIAGNOSIS — Z0001 Encounter for general adult medical examination with abnormal findings: Secondary | ICD-10-CM | POA: Diagnosis not present

## 2019-10-06 DIAGNOSIS — D509 Iron deficiency anemia, unspecified: Secondary | ICD-10-CM | POA: Diagnosis not present

## 2019-10-06 DIAGNOSIS — E782 Mixed hyperlipidemia: Secondary | ICD-10-CM | POA: Diagnosis not present

## 2019-10-06 DIAGNOSIS — E559 Vitamin D deficiency, unspecified: Secondary | ICD-10-CM | POA: Diagnosis not present

## 2019-10-06 DIAGNOSIS — I10 Essential (primary) hypertension: Secondary | ICD-10-CM | POA: Diagnosis not present

## 2019-10-06 MED ORDER — CLOTRIMAZOLE-BETAMETHASONE 1-0.05 % EX CREA: 1.0000 "application " | TOPICAL_CREAM | Freq: Two times a day (BID) | CUTANEOUS | 1 refills | Status: DC

## 2019-10-06 NOTE — Progress Notes (Signed)
lotrisone cream sent to [facility] [address] due to infection of skin in bilateral axilla.

## 2019-10-07 LAB — VITAMIN D 25 HYDROXY (VIT D DEFICIENCY, FRACTURES): Vit D, 25-Hydroxy: 44.9 ng/mL (ref 30.0–100.0)

## 2019-10-07 LAB — B12 AND FOLATE PANEL
Folate: 9.4 ng/mL (ref >3.0)
Vitamin B-12: 398 pg/mL (ref 232–1245)

## 2019-10-07 LAB — T4, FREE: Free T4: 1.54 ng/dL (ref 0.82–1.77)

## 2019-10-07 LAB — COMPREHENSIVE METABOLIC PANEL
ALT: 13 IU/L (ref 0–32)
AST: 21 IU/L (ref 0–40)
Albumin/Globulin Ratio: 2 (ref 1.2–2.2)
Albumin: 4.3 g/dL (ref 3.8–4.9)
Alkaline Phosphatase: 124 IU/L — ABNORMAL HIGH (ref 48–121)
BUN/Creatinine Ratio: 18 (ref 9–23)
BUN: 19 mg/dL (ref 6–24)
Bilirubin Total: 0.5 mg/dL (ref 0.0–1.2)
CO2: 23 mmol/L (ref 20–29)
Calcium: 10.6 mg/dL — ABNORMAL HIGH (ref 8.7–10.2)
Chloride: 105 mmol/L (ref 96–106)
Creatinine, Ser: 1.04 mg/dL — ABNORMAL HIGH (ref 0.57–1.00)
GFR calc Af Amer: 71 mL/min/{1.73_m2} (ref >59)
GFR calc non Af Amer: 61 mL/min/{1.73_m2} (ref >59)
Globulin, Total: 2.2 g/dL (ref 1.5–4.5)
Glucose: 84 mg/dL (ref 65–99)
Potassium: 4.2 mmol/L (ref 3.5–5.2)
Sodium: 141 mmol/L (ref 134–144)
Total Protein: 6.5 g/dL (ref 6.0–8.5)

## 2019-10-07 LAB — IRON AND TIBC
Iron Saturation: 37 % (ref 15–55)
Iron: 100 ug/dL (ref 27–159)
Total Iron Binding Capacity: 271 ug/dL (ref 250–450)
UIBC: 171 ug/dL (ref 131–425)

## 2019-10-07 LAB — LIPID PANEL WITH LDL/HDL RATIO
Cholesterol, Total: 170 mg/dL (ref 100–199)
HDL: 74 mg/dL (ref >39)
LDL Chol Calc (NIH): 88 mg/dL (ref 0–99)
LDL/HDL Ratio: 1.2 ratio (ref 0.0–3.2)
Triglycerides: 38 mg/dL (ref 0–149)
VLDL Cholesterol Cal: 8 mg/dL (ref 5–40)

## 2019-10-07 LAB — TSH: TSH: 1.27 u[IU]/mL (ref 0.450–4.500)

## 2019-10-07 LAB — CBC
Hematocrit: 37.8 % (ref 34.0–46.6)
Hemoglobin: 13.1 g/dL (ref 11.1–15.9)
MCH: 32.8 pg (ref 26.6–33.0)
MCHC: 34.7 g/dL (ref 31.5–35.7)
MCV: 95 fL (ref 79–97)
Platelets: 282 10*3/uL (ref 150–450)
RBC: 4 x10E6/uL (ref 3.77–5.28)
RDW: 11.7 % (ref 11.7–15.4)
WBC: 4.7 10*3/uL (ref 3.4–10.8)

## 2019-10-07 LAB — FERRITIN: Ferritin: 151 ng/mL — ABNORMAL HIGH (ref 15–150)

## 2019-10-08 NOTE — Progress Notes (Signed)
Overall, labs are ok. Will recheck calcium along with parathyroid hormones.

## 2019-10-16 ENCOUNTER — Ambulatory Visit (INDEPENDENT_AMBULATORY_CARE_PROVIDER_SITE_OTHER): Payer: BLUE CROSS/BLUE SHIELD | Admitting: Dermatology

## 2019-10-16 ENCOUNTER — Other Ambulatory Visit: Payer: Self-pay

## 2019-10-16 DIAGNOSIS — D2272 Melanocytic nevi of left lower limb, including hip: Secondary | ICD-10-CM | POA: Diagnosis not present

## 2019-10-16 DIAGNOSIS — D229 Melanocytic nevi, unspecified: Secondary | ICD-10-CM

## 2019-10-16 DIAGNOSIS — L821 Other seborrheic keratosis: Secondary | ICD-10-CM

## 2019-10-16 DIAGNOSIS — Z1283 Encounter for screening for malignant neoplasm of skin: Secondary | ICD-10-CM

## 2019-10-16 DIAGNOSIS — L578 Other skin changes due to chronic exposure to nonionizing radiation: Secondary | ICD-10-CM

## 2019-10-16 DIAGNOSIS — L304 Erythema intertrigo: Secondary | ICD-10-CM

## 2019-10-16 DIAGNOSIS — Z86018 Personal history of other benign neoplasm: Secondary | ICD-10-CM

## 2019-10-16 DIAGNOSIS — D1800 Hemangioma unspecified site: Secondary | ICD-10-CM

## 2019-10-16 DIAGNOSIS — L814 Other melanin hyperpigmentation: Secondary | ICD-10-CM

## 2019-10-16 MED ORDER — TACROLIMUS 0.1 % EX OINT: TOPICAL_OINTMENT | Freq: Two times a day (BID) | CUTANEOUS | 0 refills | Status: DC

## 2019-10-16 NOTE — Progress Notes (Signed)
   Follow-Up Visit   Subjective  Jillian Edwards is a 54 y.o. female who presents for the following: Annual Exam (History of dysplastic nevus - TBSE today). The patient presents for Total-Body Skin Exam (TBSE) for skin cancer screening and mole check.  The following portions of the chart were reviewed this encounter and updated as appropriate:  Tobacco  Allergies  Meds  Problems  Med Hx  Surg Hx  Fam Hx     Review of Systems:  No other skin or systemic complaints except as noted in HPI or Assessment and Plan.  Objective  Well appearing patient in no apparent distress; mood and affect are within normal limits.  A full examination was performed including scalp, head, eyes, ears, nose, lips, neck, chest, axillae, abdomen, back, buttocks, bilateral upper extremities, bilateral lower extremities, hands, feet, fingers, toes, fingernails, and toenails. All findings within normal limits unless otherwise noted below.  Objective  Right Axilla: Erythema  Objective  Left 2nd Distal Dorsal Toe: 0.3 cm regular brown macule.  Images     Assessment & Plan    Lentigines - Scattered tan macules - Discussed due to sun exposure - Benign, observe - Call for any changes  Seborrheic Keratoses - Stuck-on, waxy, tan-brown papules and plaques  - Discussed benign etiology and prognosis. - Observe - Call for any changes  Melanocytic Nevi - Tan-brown and/or pink-flesh-colored symmetric macules and papules - Benign appearing on exam today - Observation - Call clinic for new or changing moles - Recommend daily use of broad spectrum spf 30+ sunscreen to sun-exposed areas.   Hemangiomas - Red papules - Discussed benign nature - Observe - Call for any changes  Actinic Damage - diffuse scaly erythematous macules with underlying dyspigmentation - Recommend daily broad spectrum sunscreen SPF 30+ to sun-exposed areas, reapply every 2 hours as needed.  - Call for new or changing  lesions.  Skin cancer screening performed today.  History of Dysplastic Nevi - No evidence of recurrence today - Recommend regular full body skin exams - Recommend daily broad spectrum sunscreen SPF 30+ to sun-exposed areas, reapply every 2 hours as needed.  - Call if any new or changing lesions are noted between office visits  Erythema intertrigo Right Axilla  Recommend applying vaseline prior to working out or running to help ease friction.  Recommend deodorant/antiperspirant for sensitive skin.  tacrolimus (PROTOPIC) 0.1 % ointment - Right Axilla  Nevus Left 2nd Distal Dorsal Toe  Benign appearing. Observe Photo today  Return in about 6 months (around 04/17/2020).  I, Ashok Cordia, CMA, am acting as scribe for Sarina Ser, MD .  Documentation: I have reviewed the above documentation for accuracy and completeness, and I agree with the above.  Sarina Ser, MD

## 2019-10-17 ENCOUNTER — Telehealth: Payer: Self-pay

## 2019-10-17 NOTE — Telephone Encounter (Signed)
Confirmed and screened for 10-21-19 ov. °

## 2019-10-19 ENCOUNTER — Encounter: Payer: Self-pay | Admitting: Dermatology

## 2019-10-20 ENCOUNTER — Other Ambulatory Visit: Payer: Self-pay

## 2019-10-20 DIAGNOSIS — I10 Essential (primary) hypertension: Secondary | ICD-10-CM

## 2019-10-20 MED ORDER — PROPRANOLOL HCL ER 60 MG PO CP24: 60.0000 mg | ORAL_CAPSULE | Freq: Every day | ORAL | 3 refills | Status: DC

## 2019-10-20 MED ORDER — HYDROCHLOROTHIAZIDE 25 MG PO TABS: 25.0000 mg | ORAL_TABLET | Freq: Every day | ORAL | 3 refills | Status: DC

## 2019-10-21 ENCOUNTER — Ambulatory Visit: Payer: BLUE CROSS/BLUE SHIELD | Admitting: Nurse Practitioner

## 2019-10-21 ENCOUNTER — Encounter: Payer: Self-pay | Admitting: Nurse Practitioner

## 2019-10-21 ENCOUNTER — Other Ambulatory Visit: Payer: Self-pay

## 2019-10-21 VITALS — BP 136/93 | HR 84 | Temp 97.6°F | Resp 16 | Ht 67.0 in | Wt 156.4 lb

## 2019-10-21 DIAGNOSIS — I10 Essential (primary) hypertension: Secondary | ICD-10-CM | POA: Diagnosis not present

## 2019-10-21 DIAGNOSIS — N959 Unspecified menopausal and perimenopausal disorder: Secondary | ICD-10-CM

## 2019-10-21 DIAGNOSIS — F411 Generalized anxiety disorder: Secondary | ICD-10-CM

## 2019-10-21 DIAGNOSIS — G43809 Other migraine, not intractable, without status migrainosus: Secondary | ICD-10-CM

## 2019-10-21 DIAGNOSIS — R41840 Attention and concentration deficit: Secondary | ICD-10-CM

## 2019-10-21 MED ORDER — AMPHETAMINE-DEXTROAMPHETAMINE 30 MG PO TABS: 30.0000 mg | ORAL_TABLET | Freq: Two times a day (BID) | ORAL | 0 refills | Status: DC

## 2019-10-21 MED ORDER — ALPRAZOLAM 0.5 MG PO TABS: 0.5000 mg | ORAL_TABLET | Freq: Two times a day (BID) | ORAL | 3 refills | Status: DC | PRN

## 2019-10-21 NOTE — Progress Notes (Signed)
[facility] [place], [place] [address]  Internal MEDICINE  Office Visit Note  Patient Name: Jillian Edwards  [id number]  [id number]  Date of Service: 11/16/2019  Chief Complaint  Patient presents with  . Follow-up  . Hypertension  . Foot Swelling    Both feet swollen    The patient is here for routine follow up visit. She continues to have fatigue and some hot flashes. Labs were done earlier this month. She had mild elevation of both serum creatinine and calcium levels. Will recheck these levels along with reproductive hormones prior to her next visit. he patient continues to take adderall 20mg  twice daily as needed for concentration and focus issues. She does well with this dosing of medication without negative side effects. She is due to have refills of this medication today.        Current Medication: Outpatient Encounter Medications as of 10/21/2019  Medication Sig  . ALPRAZolam (XANAX) 0.5 MG tablet Take 1 tablet (0.5 mg total) by mouth 2 (two) times daily as needed for anxiety.  Marland Kitchen amoxicillin-clavulanate (AUGMENTIN) 875-125 MG tablet Take 1 tablet by mouth 2 (two) times daily.  Marland Kitchen amphetamine-dextroamphetamine (ADDERALL) 30 MG tablet Take 1 tablet by mouth 2 (two) times daily.  Marland Kitchen aspirin EC 81 MG tablet Take 81 mg by mouth daily.  Marland Kitchen buPROPion (WELLBUTRIN XL) 150 MG 24 hr tablet Take 2 tablets by mouth daily  . calcium carbonate (TUMS - DOSED IN MG ELEMENTAL CALCIUM) 500 MG chewable tablet Chew 1 tablet by mouth daily.  . cholecalciferol (VITAMIN D) 1000 units tablet Take 2,000 Units by mouth daily.   . clotrimazole-betamethasone (LOTRISONE) cream Apply 1 application topically 2 (two) times daily.  Marland Kitchen doxycycline (PERIOSTAT) 20 MG tablet TAKE 1 TABLET BY MOUTH TWICE A DAY  . hydrochlorothiazide (HYDRODIURIL) 25 MG tablet Take 1 tablet (25 mg total) by mouth daily. Take 1 tablet QAM and 1/2 tablet QPM as needed  . Ivermectin 1 % CREA   . mometasone  (ELOCON) 0.1 % cream Apply 1 application topically as needed.  . propranolol ER (INDERAL LA) 60 MG 24 hr capsule Take 1 capsule (60 mg total) by mouth daily.  . SUMAtriptan (IMITREX) 50 MG tablet Take 1 tablet (50 mg total) by mouth every 2 (two) hours as needed for migraine. May repeat in 2 hours if headache persists or recurs.  . tacrolimus (PROTOPIC) 0.1 % ointment Apply topically in the morning and at bedtime.  . vitamin E 180 MG (400 UNITS) capsule Take 1 capsule by mouth daily.  . [DISCONTINUED] ALPRAZolam (XANAX) 0.5 MG tablet Take 1 tablet (0.5 mg total) by mouth 2 (two) times daily as needed for anxiety.  . [DISCONTINUED] amphetamine-dextroamphetamine (ADDERALL) 30 MG tablet Take 1 tablet by mouth 2 (two) times daily.  . [DISCONTINUED] amphetamine-dextroamphetamine (ADDERALL) 30 MG tablet Take 1 tablet by mouth 2 (two) times daily.  . [DISCONTINUED] amphetamine-dextroamphetamine (ADDERALL) 30 MG tablet Take 1 tablet by mouth 2 (two) times daily.   No facility-administered encounter medications on file as of 10/21/2019.    Surgical History: Past Surgical History:  Procedure Laterality Date  . BREAST BIOPSY Left 02/06/2013   stereotactic biopsy of calcifications-benign  . BREAST ENHANCEMENT SURGERY  08/2003  . COLONOSCOPY WITH PROPOFOL N/A 12/14/2016   Procedure: COLONOSCOPY WITH PROPOFOL;  Surgeon: Jonathon Bellows, MD;  Location: [facility] ENDOSCOPY;  Service: Gastroenterology;  Laterality: N/A;  . COLPOSCOPY  03/1998; 07/1999   2000-no lesion; 2001-CIN1  . ENDOMETRIAL BIOPSY  03/2007   proliferative    Medical History: Past Medical History:  Diagnosis Date  . ADD (attention deficit disorder)   . Anxiety   . Depression   . Dysplastic nevus 11/07/2017   left lat epigastric, right mid post thigh, right lat buttocks, left sup med buttocks  . Dysplastic nevus 10/14/2018   right sup lat buttock  . Dysplastic nevus 04/24/2019   low back spinal, right low back 7.0 cm lat to spine  .  Frequent urination   . Headache   . Herpes genitalis   . Hypertension   . Stress headaches   . Stroke ([place]) ~2005    loss of peripheral vision  . Stroke or transient ischemic attack (TIA) diagnosed during current admission   . Urological disorder    bladder has dropped    Family History: Family History  Problem Relation Age of Onset  . Hypertension Father   . Diabetes Father        Has also had an amputation  . Heart attack Father   . Diabetes Mellitus II Father   . Diabetes Mellitus II Brother   . Hypertension Brother   . Breast cancer Maternal Grandmother 60  . Diabetes Mellitus II Paternal Grandmother     Social History   Socioeconomic History  . Marital status: Legally Separated    Spouse name: Not on file  . Number of children: 1  . Years of education: 71  . Highest education level: Not on file  Occupational History  . Occupation: Corporate treasurer  Tobacco Use  . Smoking status: Never Smoker  . Smokeless tobacco: Never Used  Vaping Use  . Vaping Use: Never used  Substance and Sexual Activity  . Alcohol use: No    Alcohol/week: 0.0 standard drinks  . Drug use: No  . Sexual activity: Yes    Birth control/protection: I.U.D.    Comment: Mirena  Other Topics Concern  . Not on file  Social History Narrative  . Not on file   Social Determinants of Health   Financial Resource Strain:   . Difficulty of Paying Living Expenses: Not on file  Food Insecurity:   . Worried About Charity fundraiser in the Last Year: Not on file  . Ran Out of Food in the Last Year: Not on file  Transportation Needs:   . Lack of Transportation (Medical): Not on file  . Lack of Transportation (Non-Medical): Not on file  Physical Activity:   . Days of Exercise per Week: Not on file  . Minutes of Exercise per Session: Not on file  Stress:   . Feeling of Stress : Not on file  Social Connections:   . Frequency of Communication with Friends and Family: Not on file  .  Frequency of Social Gatherings with Friends and Family: Not on file  . Attends Religious Services: Not on file  . Active Member of Clubs or Organizations: Not on file  . Attends Archivist Meetings: Not on file  . Marital Status: Not on file  Intimate Partner Violence:   . Fear of Current or Ex-Partner: Not on file  . Emotionally Abused: Not on file  . Physically Abused: Not on file  . Sexually Abused: Not on file      Review of Systems  Constitutional: Negative for activity change, chills, fatigue and unexpected weight change.  HENT: Negative for congestion, postnasal drip, rhinorrhea, sneezing and sore throat.   Respiratory: Negative for cough, chest tightness, shortness of  breath and wheezing.   Cardiovascular: Negative for chest pain and palpitations.  Gastrointestinal: Negative for abdominal pain, constipation, diarrhea, nausea and vomiting.  Endocrine: Positive for heat intolerance. Negative for cold intolerance, polydipsia and polyuria.       Normal thyroid panel.   Musculoskeletal: Negative for arthralgias, back pain, joint swelling and neck pain.  Skin: Negative for rash.  Allergic/Immunologic: Negative for environmental allergies.  Neurological: Negative for dizziness, tremors, numbness and headaches.  Hematological: Negative for adenopathy. Does not bruise/bleed easily.  Psychiatric/Behavioral: Positive for dysphoric mood. Negative for behavioral problems (Depression), sleep disturbance and suicidal ideas. The patient is nervous/anxious.        Increased anxiety and stress.     Today's Vitals   10/21/19 0853  BP: (!) 136/93  Pulse: 84  Resp: 16  Temp: 97.6 F (36.4 C)  SpO2: 99%  Weight: 156 lb 6.4 oz (70.9 kg)  Height: 5\' 7"  (1.702 m)   Body mass index is 24.5 kg/m.  Physical Exam Vitals and nursing note reviewed.  Constitutional:      General: She is not in acute distress.    Appearance: Normal appearance. She is well-developed. She is not  diaphoretic.  HENT:     Head: Normocephalic and atraumatic.     Right Ear: Tympanic membrane normal.     Left Ear: Tympanic membrane normal.     Nose: Nose normal.     Mouth/Throat:     Pharynx: No oropharyngeal exudate.  Eyes:     Pupils: Pupils are equal, round, and reactive to light.  Neck:     Thyroid: No thyromegaly.     Vascular: No carotid bruit or JVD.     Trachea: No tracheal deviation.  Cardiovascular:     Rate and Rhythm: Normal rate and regular rhythm.     Heart sounds: Normal heart sounds. No murmur heard.  No friction rub. No gallop.   Pulmonary:     Effort: Pulmonary effort is normal. No respiratory distress.     Breath sounds: Normal breath sounds. No wheezing or rales.  Chest:     Chest wall: No tenderness.  Abdominal:     Palpations: Abdomen is soft.  Musculoskeletal:        General: Normal range of motion.     Cervical back: Normal range of motion and neck supple.  Lymphadenopathy:     Cervical: No cervical adenopathy.  Skin:    General: Skin is warm and dry.  Neurological:     Mental Status: She is alert and oriented to person, place, and time.     Cranial Nerves: No cranial nerve deficit.  Psychiatric:        Attention and Perception: Attention and perception normal.        Mood and Affect: Mood is anxious and depressed.        Speech: Speech normal.        Behavior: Behavior normal. Behavior is cooperative.        Thought Content: Thought content normal.        Cognition and Memory: Cognition and memory normal.        Judgment: Judgment normal.    Assessment/Plan: 1. Essential hypertension bp stable. Continue bp medication as prescribed   2. Other migraine without status migrainosus, not intractable Currently well managed. Continue abortive therapy as prescribed and as needed.  3. Hypercalcemia Recheck calcium levels along with intact PTH.  4. Unspecified menopausal and perimenopausal disorder Check reproductive hormones for further  evaluation.  5. GAD (generalized anxiety disorder) May take alprazolam 0.5mg  up to twice daily as needed for acute anxiety. New prescription sent to pharmacy today.  - ALPRAZolam (XANAX) 0.5 MG tablet; Take 1 tablet (0.5 mg total) by mouth 2 (two) times daily as needed for anxiety.  Dispense: 60 tablet; Refill: 3  6. Attention and concentration deficit May take adderall 30mg  up to twice daily when needed for focus and concentration. Three 30 day prescriptions sent to her pharmacy. Dates are 10/21/2019, 11/19/2019, and 12/18/2019 - amphetamine-dextroamphetamine (ADDERALL) 30 MG tablet; Take 1 tablet by mouth 2 (two) times daily.  Dispense: 60 tablet; Refill: 0  General Counseling: Erynn verbalizes understanding of the findings of todays visit and agrees with plan of treatment. I have discussed any further diagnostic evaluation that may be needed or ordered today. We also reviewed her medications today. she has been encouraged to call the office with any questions or concerns that should arise related to todays visit.    This patient was seen by [place] with Dr Lavera Guise as a part of collaborative care agreement  Meds ordered this encounter  Medications  . ALPRAZolam (XANAX) 0.5 MG tablet    Sig: Take 1 tablet (0.5 mg total) by mouth 2 (two) times daily as needed for anxiety.    Dispense:  60 tablet    Refill:  3    Order Specific Question:   Supervising Provider    Answer:   Lavera Guise [[id number]]  . DISCONTD: amphetamine-dextroamphetamine (ADDERALL) 30 MG tablet    Sig: Take 1 tablet by mouth 2 (two) times daily.    Dispense:  60 tablet    Refill:  0    Order Specific Question:   Supervising Provider    Answer:   Lavera Guise [[id number]]  . DISCONTD: amphetamine-dextroamphetamine (ADDERALL) 30 MG tablet    Sig: Take 1 tablet by mouth 2 (two) times daily.    Dispense:  60 tablet    Refill:  0    Fill after 11/19/2019    Order Specific Question:   Supervising  Provider    Answer:   Lavera Guise [[id number]]  . amphetamine-dextroamphetamine (ADDERALL) 30 MG tablet    Sig: Take 1 tablet by mouth 2 (two) times daily.    Dispense:  60 tablet    Refill:  0    Fill after 12/18/2019    Order Specific Question:   Supervising Provider    Answer:   Lavera Guise [[id number]]    Total time spent: 30 Minutes   Time spent includes review of chart, medications, test results, and follow up plan with the patient.      Dr Lavera Guise Internal medicine

## 2019-10-30 ENCOUNTER — Other Ambulatory Visit: Payer: Self-pay

## 2019-10-30 ENCOUNTER — Ambulatory Visit
Admission: RE | Admit: 2019-10-30 | Discharge: 2019-10-30 | Disposition: A | Discharge status: Home / Self Care | Payer: BLUE CROSS/BLUE SHIELD | Source: Ambulatory Visit | Attending: Emergency Medicine | Admitting: Emergency Medicine

## 2019-10-30 VITALS — BP 156/98 | HR 76 | Temp 98.1°F | Resp 19

## 2019-10-30 DIAGNOSIS — Z20822 Contact with and (suspected) exposure to covid-19: Secondary | ICD-10-CM | POA: Diagnosis not present

## 2019-10-30 NOTE — Discharge Instructions (Addendum)
Your COVID test is pending.  You should self quarantine until the test result is back.    Follow up with your primary care provider as needed.

## 2019-10-30 NOTE — ED Triage Notes (Signed)
Patient reports she was exposed to two people with COVID at work. Denies symptoms but is requesting testing.

## 2019-10-30 NOTE — ED Provider Notes (Signed)
Roderic Palau    CSN: [id number] Arrival date & time: 10/30/19  1243      History   Chief Complaint Chief Complaint  Patient presents with  . Covid Exposure    HPI Aleenah CHARMION HAPKE is a 54 y.o. female. Patient presents with request for a COVID test.  Shee was exposed to a positive coworker.  She denies symptoms, including fever, chills, congestion, cough, shortness of breath, vomiting, diarrhea, rash, or other concerns.  No treatments attempted at home.  She is fully vaccinated.    The history is provided by the patient.    Past Medical History:  Diagnosis Date  . ADD (attention deficit disorder)   . Anxiety   . Depression   . Dysplastic nevus 11/07/2017   left lat epigastric, right mid post thigh, right lat buttocks, left sup med buttocks  . Dysplastic nevus 10/14/2018   right sup lat buttock  . Dysplastic nevus 04/24/2019   low back spinal, right low back 7.0 cm lat to spine  . Frequent urination   . Headache   . Herpes genitalis   . Hypertension   . Stress headaches   . Stroke ([place]) ~2005    loss of peripheral vision  . Stroke or transient ischemic attack (TIA) diagnosed during current admission   . Urological disorder    bladder has dropped    Patient Active Problem List   Diagnosis Date Noted  . Acute otitis externa of left ear 04/26/2019  . Encounter for long-term (current) use of medications 10/22/2018  . Primary insomnia 10/22/2018  . Migraine 07/23/2018  . Chronic pansinusitis 06/17/2018  . New daily persistent headache 06/03/2018  . History of stroke 06/03/2018  . Other fatigue 01/26/2018  . Unspecified menopausal and perimenopausal disorder 01/26/2018  . Acute recurrent pansinusitis 08/02/2017  . Edema of both feet 08/02/2017  . Attention and concentration deficit 05/04/2017  . GAD (generalized anxiety disorder) 05/04/2017  . Stroke ([place])   . Essential hypertension   . Herpes genitalis   . Depression   . Anxiety   . ADD (attention  deficit disorder)   . Incontinence 08/01/2015  . Microscopic hematuria 08/01/2015  . Urinary frequency 08/01/2015  . Cystocele, grade 2 08/01/2015    Past Surgical History:  Procedure Laterality Date  . BREAST BIOPSY Left 02/06/2013   stereotactic biopsy of calcifications-benign  . BREAST ENHANCEMENT SURGERY  08/2003  . COLONOSCOPY WITH PROPOFOL N/A 12/14/2016   Procedure: COLONOSCOPY WITH PROPOFOL;  Surgeon: Jonathon Bellows, MD;  Location: [facility] ENDOSCOPY;  Service: Gastroenterology;  Laterality: N/A;  . COLPOSCOPY  03/1998; 07/1999   2000-no lesion; 2001-CIN1  . ENDOMETRIAL BIOPSY  03/2007   proliferative    OB History    Gravida  1   Para  1   Term  1   Preterm      AB      Living  1     SAB      TAB      Ectopic      Multiple      Live Births  1            Home Medications    Prior to Admission medications   Medication Sig Start Date End Date Taking? Authorizing Provider  ALPRAZolam Duanne Moron) 0.5 MG tablet Take 1 tablet (0.5 mg total) by mouth 2 (two) times daily as needed for anxiety. 10/21/19   Ronnell Freshwater, NP  amoxicillin-clavulanate (AUGMENTIN) 875-125 MG tablet Take 1 tablet by  mouth 2 (two) times daily. 08/15/19   Ronnell Freshwater, NP  amphetamine-dextroamphetamine (ADDERALL) 30 MG tablet Take 1 tablet by mouth 2 (two) times daily. 10/21/19   Ronnell Freshwater, NP  aspirin EC 81 MG tablet Take 81 mg by mouth daily.    [provider]  buPROPion (WELLBUTRIN XL) 150 MG 24 hr tablet Take 2 tablets by mouth daily 07/15/19   Ronnell Freshwater, NP  calcium carbonate (TUMS - DOSED IN MG ELEMENTAL CALCIUM) 500 MG chewable tablet Chew 1 tablet by mouth daily.    [provider]  cholecalciferol (VITAMIN D) 1000 units tablet Take 2,000 Units by mouth daily.     [provider]  clotrimazole-betamethasone (LOTRISONE) cream Apply 1 application topically 2 (two) times daily. 10/06/19   Ronnell Freshwater, NP  doxycycline (PERIOSTAT) 20 MG  tablet TAKE 1 TABLET BY MOUTH TWICE A DAY 07/03/19   Ralene Bathe, MD  hydrochlorothiazide (HYDRODIURIL) 25 MG tablet Take 1 tablet (25 mg total) by mouth daily. Take 1 tablet QAM and 1/2 tablet QPM as needed 10/20/19   Ronnell Freshwater, NP  Ivermectin 1 % CREA  03/24/18   [provider]  mometasone (ELOCON) 0.1 % cream Apply 1 application topically as needed. 04/24/19   [provider]  propranolol ER (INDERAL LA) 60 MG 24 hr capsule Take 1 capsule (60 mg total) by mouth daily. 10/20/19   Ronnell Freshwater, NP  SUMAtriptan (IMITREX) 50 MG tablet Take 1 tablet (50 mg total) by mouth every 2 (two) hours as needed for migraine. May repeat in 2 hours if headache persists or recurs. 06/09/19   Ronnell Freshwater, NP  tacrolimus (PROTOPIC) 0.1 % ointment Apply topically in the morning and at bedtime. 10/16/19   Ralene Bathe, MD  vitamin E 180 MG (400 UNITS) capsule Take 1 capsule by mouth daily. 03/19/19   [provider]    Family History Family History  Problem Relation Age of Onset  . Hypertension Father   . Diabetes Father        Has also had an amputation  . Heart attack Father   . Diabetes Mellitus II Father   . Diabetes Mellitus II Brother   . Hypertension Brother   . Breast cancer Maternal Grandmother 60  . Diabetes Mellitus II Paternal Grandmother     Social History Social History   Tobacco Use  . Smoking status: Never Smoker  . Smokeless tobacco: Never Used  Vaping Use  . Vaping Use: Never used  Substance Use Topics  . Alcohol use: No    Alcohol/week: 0.0 standard drinks  . Drug use: No     Allergies   Patient has no known allergies.   Review of Systems Review of Systems  Constitutional: Negative for chills and fever.  HENT: Negative for congestion, ear pain and sore throat.   Eyes: Negative for pain and visual disturbance.  Respiratory: Negative for cough and shortness of breath.   Cardiovascular: Negative for chest pain and  palpitations.  Gastrointestinal: Negative for abdominal pain, diarrhea and vomiting.  Genitourinary: Negative for dysuria and hematuria.  Musculoskeletal: Negative for arthralgias and back pain.  Skin: Negative for color change and rash.  Neurological: Negative for seizures and syncope.  All other systems reviewed and are negative.    Physical Exam Triage Vital Signs ED Triage Vitals  Enc Vitals Group     BP 10/30/19 1245 (!) 156/98     Pulse Rate 10/30/19 1245  76     Resp 10/30/19 1245 19     Temp 10/30/19 1245 98.1 F (36.7 C)     Temp src --      SpO2 10/30/19 1245 98 %     Weight --      Height --      Head Circumference --      Peak Flow --      Pain Score 10/30/19 1244 0     Pain Loc --      Pain Edu? --      Excl. in [place]? --    No data found.  Updated Vital Signs BP (!) 156/98   Pulse 76   Temp 98.1 F (36.7 C)   Resp 19   SpO2 98%   Visual Acuity Right Eye Distance:   Left Eye Distance:   Bilateral Distance:    Right Eye Near:   Left Eye Near:    Bilateral Near:     Physical Exam Vitals and nursing note reviewed.  Constitutional:      General: She is not in acute distress.    Appearance: She is well-developed. She is not ill-appearing.  HENT:     Head: Normocephalic and atraumatic.     Right Ear: Tympanic membrane normal.     Left Ear: Tympanic membrane normal.     Nose: Nose normal.     Mouth/Throat:     Mouth: Mucous membranes are moist.     Pharynx: Oropharynx is clear.  Eyes:     Conjunctiva/sclera: Conjunctivae normal.  Cardiovascular:     Rate and Rhythm: Normal rate and regular rhythm.     Heart sounds: No murmur heard.   Pulmonary:     Effort: Pulmonary effort is normal. No respiratory distress.     Breath sounds: Normal breath sounds.  Abdominal:     Palpations: Abdomen is soft.     Tenderness: There is no abdominal tenderness. There is no guarding or rebound.  Musculoskeletal:     Cervical back: Neck supple.  Skin:     General: Skin is warm and dry.     Findings: No rash.  Neurological:     General: No focal deficit present.     Mental Status: She is alert and oriented to person, place, and time.     Gait: Gait normal.  Psychiatric:        Mood and Affect: Mood normal.        Behavior: Behavior normal.      UC Treatments / Results  Labs (all labs ordered are listed, but only abnormal results are displayed) Labs Reviewed  NOVEL CORONAVIRUS, NAA    EKG   Radiology No results found.  Procedures Procedures (including critical care time)  Medications Ordered in UC Medications - No data to display  Initial Impression / Assessment and Plan / UC Course  I have reviewed the triage vital signs and the nursing notes.  Pertinent labs & imaging results that were available during my care of the patient were reviewed by me and considered in my medical decision making (see chart for details).   Exposure to COVID.  COVID test performed here.  Instructed patient to self quarantine until the test result is back.  Discussed with patient that she can take Tylenol as needed for fever or discomfort.  Instructed patient to go to the emergency department if she develops high fever, shortness of breath, severe diarrhea, or other concerning symptoms.  Patient agrees with plan of  care.    Final Clinical Impressions(s) / UC Diagnoses   Final diagnoses:  Exposure to COVID-19 virus     Discharge Instructions     Your COVID test is pending.  You should self quarantine until the test result is back.    Follow up with your primary care provider as needed.        ED Prescriptions    None     PDMP not reviewed this encounter.   Sharion Balloon, NP 10/30/19 1315

## 2019-11-01 LAB — SARS-COV-2, NAA 2 DAY TAT

## 2019-11-01 LAB — NOVEL CORONAVIRUS, NAA: SARS-CoV-2, NAA: NOT DETECTED

## 2019-11-16 ENCOUNTER — Other Ambulatory Visit: Payer: Self-pay | Admitting: Dermatology

## 2019-11-16 DIAGNOSIS — L304 Erythema intertrigo: Secondary | ICD-10-CM

## 2019-11-18 ENCOUNTER — Other Ambulatory Visit: Payer: Self-pay

## 2019-11-18 DIAGNOSIS — F411 Generalized anxiety disorder: Secondary | ICD-10-CM

## 2019-11-18 MED ORDER — BUPROPION HCL ER (XL) 150 MG PO TB24: ORAL_TABLET | ORAL | 1 refills | Status: DC

## 2019-12-14 ENCOUNTER — Other Ambulatory Visit: Payer: Self-pay | Admitting: Dermatology

## 2019-12-14 DIAGNOSIS — L304 Erythema intertrigo: Secondary | ICD-10-CM

## 2019-12-29 ENCOUNTER — Encounter: Payer: Self-pay | Admitting: Nurse Practitioner

## 2019-12-29 ENCOUNTER — Other Ambulatory Visit: Payer: Self-pay

## 2019-12-29 ENCOUNTER — Other Ambulatory Visit: Payer: Self-pay | Admitting: Nurse Practitioner

## 2019-12-29 DIAGNOSIS — G4452 New daily persistent headache (NDPH): Secondary | ICD-10-CM

## 2019-12-29 DIAGNOSIS — I10 Essential (primary) hypertension: Secondary | ICD-10-CM

## 2019-12-29 MED ORDER — PROPRANOLOL HCL ER 60 MG PO CP24: 60.0000 mg | ORAL_CAPSULE | Freq: Every day | ORAL | 3 refills | Status: DC

## 2019-12-29 MED ORDER — UBRELVY 100 MG PO TABS: 1.0000 | ORAL_TABLET | Freq: Two times a day (BID) | ORAL | 3 refills | Status: DC | PRN

## 2019-12-29 NOTE — Progress Notes (Signed)
New rx for ubrelvy 100mg  sent to her pharmacy. May take up to twice daily as needed for acute migraine headache. Sent to [facility].

## 2020-01-19 ENCOUNTER — Other Ambulatory Visit: Payer: Self-pay

## 2020-01-19 DIAGNOSIS — I10 Essential (primary) hypertension: Secondary | ICD-10-CM

## 2020-01-19 MED ORDER — HYDROCHLOROTHIAZIDE 25 MG PO TABS: 25.0000 mg | ORAL_TABLET | Freq: Every day | ORAL | 3 refills | Status: DC

## 2020-01-23 ENCOUNTER — Telehealth: Payer: Self-pay

## 2020-01-23 NOTE — Telephone Encounter (Signed)
Prior auth  For UBRELVY was approved from 01/01/2020 to 03/24/2020.  Pt was informed.

## 2020-02-02 ENCOUNTER — Encounter: Payer: Self-pay | Admitting: Dermatology

## 2020-02-02 ENCOUNTER — Other Ambulatory Visit: Payer: Self-pay

## 2020-02-02 MED ORDER — DOXYCYCLINE HYCLATE 20 MG PO TABS: 20.0000 mg | ORAL_TABLET | Freq: Two times a day (BID) | ORAL | 3 refills | Status: DC

## 2020-02-03 ENCOUNTER — Ambulatory Visit (INDEPENDENT_AMBULATORY_CARE_PROVIDER_SITE_OTHER): Payer: BLUE CROSS/BLUE SHIELD | Admitting: Nurse Practitioner

## 2020-02-03 ENCOUNTER — Other Ambulatory Visit: Payer: Self-pay

## 2020-02-03 ENCOUNTER — Encounter: Payer: Self-pay | Admitting: Nurse Practitioner

## 2020-02-03 VITALS — BP 128/80 | HR 76 | Temp 97.9°F | Resp 16 | Ht 67.0 in | Wt 158.0 lb

## 2020-02-03 DIAGNOSIS — G43809 Other migraine, not intractable, without status migrainosus: Secondary | ICD-10-CM | POA: Diagnosis not present

## 2020-02-03 DIAGNOSIS — R41840 Attention and concentration deficit: Secondary | ICD-10-CM | POA: Diagnosis not present

## 2020-02-03 DIAGNOSIS — Z0001 Encounter for general adult medical examination with abnormal findings: Secondary | ICD-10-CM | POA: Diagnosis not present

## 2020-02-03 DIAGNOSIS — Z79899 Other long term (current) drug therapy: Secondary | ICD-10-CM | POA: Insufficient documentation

## 2020-02-03 DIAGNOSIS — Z1231 Encounter for screening mammogram for malignant neoplasm of breast: Secondary | ICD-10-CM | POA: Insufficient documentation

## 2020-02-03 DIAGNOSIS — R300 Dysuria: Secondary | ICD-10-CM | POA: Diagnosis not present

## 2020-02-03 DIAGNOSIS — I10 Essential (primary) hypertension: Secondary | ICD-10-CM

## 2020-02-03 LAB — POCT URINE DRUG SCREEN
POC Amphetamine UR: POSITIVE — AB
POC BENZODIAZEPINES UR: NOT DETECTED
POC Barbiturate UR: NOT DETECTED
POC Cocaine UR: NOT DETECTED
POC Ecstasy UR: NOT DETECTED
POC Marijuana UR: NOT DETECTED
POC Methadone UR: NOT DETECTED
POC Methamphetamine UR: NOT DETECTED
POC Opiate Ur: NOT DETECTED
POC Oxycodone UR: NOT DETECTED
POC PHENCYCLIDINE UR: NOT DETECTED
POC TRICYCLICS UR: NOT DETECTED

## 2020-02-03 MED ORDER — AMPHETAMINE-DEXTROAMPHETAMINE 30 MG PO TABS: 30.0000 mg | ORAL_TABLET | Freq: Two times a day (BID) | ORAL | 0 refills | Status: DC

## 2020-02-03 NOTE — Progress Notes (Signed)
[facility] [place], [place] [address]  Internal MEDICINE  Office Visit Note  Patient Name: Jillian Edwards  [id number]  [id number]  Date of Service: 02/03/2020   Pt is here for routine health maintenance examination  Chief Complaint  Patient presents with  . Annual Exam  . Depression  . Hypertension  . controlled substance form     The patient is here for health maintenance exam. She reports some nasal congestion and post nasal drip which is normal for her at this time of year. Is taking a claritin for this and states that she otherwise feels well.  The patient does suffer from anxiety and adult onset ADD. She was administered a Conner's self assessment for ADD. She did score 6/6 indicating that she does have adult ADD. She currently takes adderall 30mg  twice daily when needed. She works a full time job with full time + hours.  We have tried multiple doses of adderall. We tried vyvanse, and long acting Adderall. None of them worked as well as the current dose. She continues to take wellbutrin. She does have a prescription for Alprazolam which she takes rarely. Her medication fill history was verified per her PDMP.  She had blood work done in 09/2019. Her calcium level was 10.6.  She decreased daily dosing of TUMs. She has lab slip to have calcium levels rechecked.  Blood pressure is well managed. She has no new concerns or complaints today.      Current Medication: Outpatient Encounter Medications as of 02/03/2020  Medication Sig  . ALPRAZolam (XANAX) 0.5 MG tablet Take 1 tablet (0.5 mg total) by mouth 2 (two) times daily as needed for anxiety.  Marland Kitchen amphetamine-dextroamphetamine (ADDERALL) 30 MG tablet Take 1 tablet by mouth 2 (two) times daily.  Marland Kitchen aspirin EC 81 MG tablet Take 81 mg by mouth daily.  Marland Kitchen buPROPion (WELLBUTRIN XL) 150 MG 24 hr tablet Take 2 tablets by mouth daily  . calcium carbonate (TUMS - DOSED IN MG ELEMENTAL CALCIUM) 500 MG chewable  tablet Chew 1 tablet by mouth daily.  . cholecalciferol (VITAMIN D) 1000 units tablet Take 2,000 Units by mouth daily.   . clotrimazole-betamethasone (LOTRISONE) cream Apply 1 application topically 2 (two) times daily.  . hydrochlorothiazide (HYDRODIURIL) 25 MG tablet Take 1 tablet (25 mg total) by mouth daily. Take 1 tablet QAM and 1/2 tablet QPM as needed  . Ivermectin 1 % CREA   . mometasone (ELOCON) 0.1 % cream Apply 1 application topically as needed.  . propranolol ER (INDERAL LA) 60 MG 24 hr capsule Take 1 capsule (60 mg total) by mouth daily.  . SUMAtriptan (IMITREX) 50 MG tablet Take 1 tablet (50 mg total) by mouth every 2 (two) hours as needed for migraine. May repeat in 2 hours if headache persists or recurs.  . tacrolimus (PROTOPIC) 0.1 % ointment APPLY TOPICALLY IN THE MORNING AND AT BEDTIME.  Marland Kitchen Ubrogepant (UBRELVY) 100 MG TABS Take 1 tablet by mouth 2 (two) times daily as needed.  . vitamin E 180 MG (400 UNITS) capsule Take 1 capsule by mouth daily.  . [DISCONTINUED] amoxicillin-clavulanate (AUGMENTIN) 875-125 MG tablet Take 1 tablet by mouth 2 (two) times daily.  . [DISCONTINUED] amphetamine-dextroamphetamine (ADDERALL) 30 MG tablet Take 1 tablet by mouth 2 (two) times daily.  . [DISCONTINUED] amphetamine-dextroamphetamine (ADDERALL) 30 MG tablet Take 1 tablet by mouth 2 (two) times daily.  . [DISCONTINUED] doxycycline (PERIOSTAT) 20 MG tablet TAKE 1 TABLET BY MOUTH TWICE A DAY  . [  DISCONTINUED] doxycycline (PERIOSTAT) 20 MG tablet Take 1 tablet (20 mg total) by mouth 2 (two) times daily.   No facility-administered encounter medications on file as of 02/03/2020.    Surgical History: Past Surgical History:  Procedure Laterality Date  . BREAST BIOPSY Left 02/06/2013   stereotactic biopsy of calcifications-benign  . BREAST ENHANCEMENT SURGERY  08/2003  . COLONOSCOPY WITH PROPOFOL N/A 12/14/2016   Procedure: COLONOSCOPY WITH PROPOFOL;  Surgeon: Jonathon Bellows, MD;  Location: [facility]  ENDOSCOPY;  Service: Gastroenterology;  Laterality: N/A;  . COLPOSCOPY  03/1998; 07/1999   2000-no lesion; 2001-CIN1  . ENDOMETRIAL BIOPSY  03/2007   proliferative    Medical History: Past Medical History:  Diagnosis Date  . ADD (attention deficit disorder)   . Anxiety   . Depression   . Dysplastic nevus 11/07/2017   left lat epigastric, right mid post thigh, right lat buttocks, left sup med buttocks  . Dysplastic nevus 10/14/2018   right sup lat buttock  . Dysplastic nevus 04/24/2019   low back spinal, right low back 7.0 cm lat to spine  . Frequent urination   . Headache   . Herpes genitalis   . Hypertension   . Stress headaches   . Stroke ([place]) ~2005    loss of peripheral vision  . Stroke or transient ischemic attack (TIA) diagnosed during current admission   . Urological disorder    bladder has dropped    Family History: Family History  Problem Relation Age of Onset  . Hypertension Father   . Diabetes Father        Has also had an amputation  . Heart attack Father   . Diabetes Mellitus II Father   . Diabetes Mellitus II Brother   . Hypertension Brother   . Breast cancer Maternal Grandmother 60  . Diabetes Mellitus II Paternal Grandmother       Review of Systems  Constitutional: Negative for activity change, chills, fatigue and unexpected weight change.  HENT: Positive for congestion and postnasal drip. Negative for rhinorrhea, sneezing and sore throat.   Respiratory: Negative for cough, chest tightness and shortness of breath.   Cardiovascular: Negative for chest pain and palpitations.  Gastrointestinal: Negative for abdominal pain, constipation, diarrhea, nausea and vomiting.  Endocrine: Negative for cold intolerance, heat intolerance, polydipsia and polyuria.  Genitourinary: Negative for dysuria, frequency and urgency.  Musculoskeletal: Negative for arthralgias, back pain, joint swelling and neck pain.  Skin: Negative for rash.  Allergic/Immunologic:  Negative for environmental allergies.  Neurological: Negative for dizziness, tremors, numbness and headaches.  Hematological: Negative for adenopathy. Does not bruise/bleed easily.  Psychiatric/Behavioral: Positive for dysphoric mood. Negative for behavioral problems (Depression), sleep disturbance and suicidal ideas. The patient is nervous/anxious.        Increased anxiety and stress.     Today's Vitals   02/03/20 0838  BP: 128/80  Pulse: 76  Resp: 16  Temp: 97.9 F (36.6 C)  SpO2: 94%  Weight: 158 lb (71.7 kg)  Height: 5\' 7"  (1.702 m)   Body mass index is 24.75 kg/m.  Physical Exam Vitals and nursing note reviewed.  Constitutional:      General: She is not in acute distress.    Appearance: Normal appearance. She is well-developed. She is not diaphoretic.  HENT:     Head: Normocephalic and atraumatic.     Right Ear: Tympanic membrane normal.     Left Ear: Tympanic membrane normal.     Nose: Nose normal.     Mouth/Throat:  Pharynx: No oropharyngeal exudate.  Eyes:     Pupils: Pupils are equal, round, and reactive to light.  Neck:     Thyroid: No thyromegaly.     Vascular: No carotid bruit or JVD.     Trachea: No tracheal deviation.  Cardiovascular:     Rate and Rhythm: Normal rate and regular rhythm.     Pulses: Normal pulses.     Heart sounds: Normal heart sounds. No murmur heard.  No friction rub. No gallop.   Pulmonary:     Effort: Pulmonary effort is normal. No respiratory distress.     Breath sounds: Normal breath sounds. No wheezing or rales.  Chest:     Chest wall: No tenderness.  Abdominal:     General: Bowel sounds are normal.     Palpations: Abdomen is soft.     Tenderness: There is no abdominal tenderness.  Musculoskeletal:        General: Normal range of motion.     Cervical back: Normal range of motion and neck supple.  Lymphadenopathy:     Cervical: No cervical adenopathy.  Skin:    General: Skin is warm and dry.  Neurological:      General: No focal deficit present.     Mental Status: She is alert and oriented to person, place, and time.     Cranial Nerves: No cranial nerve deficit.  Psychiatric:        Attention and Perception: Attention and perception normal.        Mood and Affect: Mood is anxious. Mood is not depressed.        Speech: Speech normal.        Behavior: Behavior normal. Behavior is cooperative.        Thought Content: Thought content normal.        Cognition and Memory: Cognition and memory normal.        Judgment: Judgment normal.      LABS: Recent Results (from the past 2160 hour(s))  POCT Urine Drug Screen     Status: Abnormal   Collection Time: 02/03/20  8:53 AM  Result Value Ref Range   POC Methamphetamine UR None Detected None Detected   POC Opiate Ur None Detected None Detected   POC Barbiturate UR None Detected None Detected   POC Amphetamine UR Positive (A) None Detected   POC Oxycodone UR None Detected None Detected   POC Cocaine UR None Detected None Detected   POC Ecstasy UR None Detected None Detected   POC TRICYCLICS UR None Detected None Detected   POC PHENCYCLIDINE UR None Detected None Detected   POC Marijuana UR None Detected None Detected   POC Methadone UR None Detected None Detected   POC BENZODIAZEPINES UR None Detected None Detected   URINE TEMPERATURE     POC DRUG SCREEN OXIDANTS URINE     POC SPECIFIC GRAVITY URINE     POC PH URINE     Methylenedioxyamphetamine      Assessment/Plan: 1. Encounter for general adult medical examination with abnormal findings Annual health maintenance exam today  2. Essential hypertension Stable. Continue bp medication as prescribed   3. Other migraine without status migrainosus, not intractable Currently well managed. Continue with preventive therapy as prescribed. copay assistance card provided for ubrelvy   4. Attention and concentration deficit Patient scored 6/6 on Conner's self-assessment for adult onset ADD. Multiple  alternative medications have been tried in the past. Currently on medication regimen which has worked the best  for her. May continue adderall 30mg  up to twice daily as needed for focus and concentration. Dates are 02/03/2020 and 03/02/2020.  - amphetamine-dextroamphetamine (ADDERALL) 30 MG tablet; Take 1 tablet by mouth 2 (two) times daily.  Dispense: 60 tablet; Refill: 0  5. Encounter for long-term (current) use of high-risk medication UDS appropriately positive for AMP only.  - POCT Urine Drug Screen  6. Encounter for screening mammogram for malignant neoplasm of breast - MM DIGITAL SCREENING BILATERAL; Future  7. Dysuria - UA/M w/rflx Culture, Routine   General Counseling: Maizey verbalizes understanding of the findings of todays visit and agrees with plan of treatment. I have discussed any further diagnostic evaluation that may be needed or ordered today. We also reviewed her medications today. she has been encouraged to call the office with any questions or concerns that should arise related to todays visit.    Counseling:  This patient was seen by Leretha Pol FNP Collaboration with Dr Lavera Guise as a part of collaborative care agreement  Orders Placed This Encounter  Procedures  . MM DIGITAL SCREENING BILATERAL  . UA/M w/rflx Culture, Routine  . POCT Urine Drug Screen    Meds ordered this encounter  Medications  . DISCONTD: amphetamine-dextroamphetamine (ADDERALL) 30 MG tablet    Sig: Take 1 tablet by mouth 2 (two) times daily.    Dispense:  60 tablet    Refill:  0    Order Specific Question:   Supervising Provider    Answer:   Lavera Guise [[id number]]  . amphetamine-dextroamphetamine (ADDERALL) 30 MG tablet    Sig: Take 1 tablet by mouth 2 (two) times daily.    Dispense:  60 tablet    Refill:  0    Fill after 03/02/2020    Order Specific Question:   Supervising Provider    Answer:   Lavera Guise [[id number]]    Total time spent: 8 Minutes  Time spent includes review  of chart, medications, test results, and follow up plan with the patient.     Lavera Guise, MD  Internal Medicine

## 2020-02-04 LAB — UA/M W/RFLX CULTURE, ROUTINE
Bilirubin, UA: NEGATIVE
Glucose, UA: NEGATIVE
Leukocytes,UA: NEGATIVE
Nitrite, UA: NEGATIVE
RBC, UA: NEGATIVE
Specific Gravity, UA: >=1.03 — AB (ref 1.005–1.030)
Urobilinogen, Ur: 0.2 mg/dL (ref 0.2–1.0)
pH, UA: 5.5 (ref 5.0–7.5)

## 2020-02-04 LAB — MICROSCOPIC EXAMINATION
Bacteria, UA: NONE SEEN
Casts: NONE SEEN /lpf

## 2020-03-12 ENCOUNTER — Other Ambulatory Visit: Payer: Self-pay | Admitting: Nurse Practitioner

## 2020-03-12 ENCOUNTER — Encounter: Payer: Self-pay | Admitting: Nurse Practitioner

## 2020-03-12 DIAGNOSIS — R41840 Attention and concentration deficit: Secondary | ICD-10-CM

## 2020-03-12 MED ORDER — AMPHETAMINE-DEXTROAMPHETAMINE 30 MG PO TABS: 30.0000 mg | ORAL_TABLET | Freq: Two times a day (BID) | ORAL | 0 refills | Status: DC

## 2020-03-15 ENCOUNTER — Telehealth: Payer: Self-pay

## 2020-03-15 NOTE — Telephone Encounter (Signed)
Pt.notified

## 2020-03-15 NOTE — Telephone Encounter (Signed)
Hey. I reviewed this and perhaps it got overlooked because then she sent me a different message, unrelated to this which is did respond too. Anyway, you told her correctly. This response has been normal for many people getting the booster shot. She needs to alternate tylenol with ibuprofen, rest, and increase fluids. Swollen lymph nodes in the neck and under the arm have also been noted by other patients. Sometimes, these symptoms have been lasting for up to a week. Keep monitoring blood pressure. Unless it stays high, I would just continue to monitor symptoms.

## 2020-03-22 ENCOUNTER — Other Ambulatory Visit: Payer: Self-pay | Admitting: Nurse Practitioner

## 2020-03-22 DIAGNOSIS — N959 Unspecified menopausal and perimenopausal disorder: Secondary | ICD-10-CM | POA: Diagnosis not present

## 2020-03-22 DIAGNOSIS — I10 Essential (primary) hypertension: Secondary | ICD-10-CM | POA: Diagnosis not present

## 2020-03-23 LAB — BASIC METABOLIC PANEL
BUN/Creatinine Ratio: 15 (ref 9–23)
BUN: 13 mg/dL (ref 6–24)
CO2: 25 mmol/L (ref 20–29)
Calcium: 10.7 mg/dL — ABNORMAL HIGH (ref 8.7–10.2)
Chloride: 101 mmol/L (ref 96–106)
Creatinine, Ser: 0.87 mg/dL (ref 0.57–1.00)
GFR calc Af Amer: 87 mL/min/{1.73_m2} (ref >59)
GFR calc non Af Amer: 76 mL/min/{1.73_m2} (ref >59)
Glucose: 117 mg/dL — ABNORMAL HIGH (ref 65–99)
Potassium: 3.7 mmol/L (ref 3.5–5.2)
Sodium: 140 mmol/L (ref 134–144)

## 2020-03-23 LAB — ESTRADIOL: Estradiol: <5 pg/mL

## 2020-03-23 LAB — FSH/LH
FSH: 59.3 m[IU]/mL
LH: 39.3 m[IU]/mL

## 2020-03-23 LAB — PROLACTIN: Prolactin: 10.6 ng/mL (ref 4.8–23.3)

## 2020-03-23 LAB — CALCIUM, IONIZED: Calcium, Ion: 5.6 mg/dL (ref 4.5–5.6)

## 2020-03-23 LAB — FERRITIN: Ferritin: 189 ng/mL — ABNORMAL HIGH (ref 15–150)

## 2020-03-23 LAB — PARATHYROID HORMONE, INTACT (NO CA): PTH: 31 pg/mL (ref 15–65)

## 2020-03-31 ENCOUNTER — Other Ambulatory Visit: Payer: Self-pay

## 2020-03-31 DIAGNOSIS — I10 Essential (primary) hypertension: Secondary | ICD-10-CM

## 2020-03-31 MED ORDER — PROPRANOLOL HCL ER 60 MG PO CP24: 60.0000 mg | ORAL_CAPSULE | Freq: Every day | ORAL | 3 refills | Status: DC

## 2020-04-05 ENCOUNTER — Other Ambulatory Visit: Payer: Self-pay

## 2020-04-05 ENCOUNTER — Encounter: Payer: Self-pay | Admitting: Nurse Practitioner

## 2020-04-05 ENCOUNTER — Ambulatory Visit: Payer: BLUE CROSS/BLUE SHIELD | Admitting: Nurse Practitioner

## 2020-04-05 DIAGNOSIS — R41840 Attention and concentration deficit: Secondary | ICD-10-CM | POA: Diagnosis not present

## 2020-04-05 DIAGNOSIS — I10 Essential (primary) hypertension: Secondary | ICD-10-CM | POA: Diagnosis not present

## 2020-04-05 DIAGNOSIS — N959 Unspecified menopausal and perimenopausal disorder: Secondary | ICD-10-CM

## 2020-04-05 MED ORDER — AMPHETAMINE-DEXTROAMPHETAMINE 30 MG PO TABS: 30.0000 mg | ORAL_TABLET | Freq: Two times a day (BID) | ORAL | 0 refills | Status: DC

## 2020-04-05 NOTE — Progress Notes (Signed)
[facility] [place], [place] [address]  Internal MEDICINE  Office Visit Note  Patient Name: Jillian Edwards  [id number]  [id number]  Date of Service: 04/05/2020  Chief Complaint  Patient presents with  . Follow-up  . Depression  . Anxiety  . Hypertension    The patient is here for routine follow up. She has recently started using OTC, store bought, CBD gummies, to help with anxiety around crowds. She states that she has taken this once.  -did have labs drawn prior to this visit.  --recheck calcium level - calcium 10.7, ionized calcium normal at 5.6, and parathyroid hormone was normal.  --ferritin level mildly elevated from last visit 187 --reproductive hormones indicate that she is likely to be in perimenopause if not menopause. She does have hot flashes and night sweats. She is not a candidtate for hrt as she has had a prior TIA.  -has fatigue and generalized joint pain.  -The patient does suffer from anxiety and adult onset ADD. She did score 6/6 on a Conner's self-assessment, indicating that she does have adult ADD. She currently takes adderall 30mg  twice daily when needed. She works a full time job with full time + hours.  We have tried multiple doses of adderall. We tried vyvanse, and long acting Adderall. None of them worked as well as the current dose. She continues to take wellbutrin. She does have a prescription for Alprazolam which she takes rarely. Her medication fill history was verified per her PDMP.         Current Medication: Outpatient Encounter Medications as of 04/05/2020  Medication Sig  . ALPRAZolam (XANAX) 0.5 MG tablet Take 1 tablet (0.5 mg total) by mouth 2 (two) times daily as needed for anxiety.  Marland Kitchen aspirin EC 81 MG tablet Take 81 mg by mouth daily.  Marland Kitchen buPROPion (WELLBUTRIN XL) 150 MG 24 hr tablet Take 2 tablets by mouth daily  . calcium carbonate (TUMS - DOSED IN MG ELEMENTAL CALCIUM) 500 MG chewable tablet Chew 1 tablet by mouth  daily.  . cholecalciferol (VITAMIN D) 1000 units tablet Take 2,000 Units by mouth daily.   . clotrimazole-betamethasone (LOTRISONE) cream Apply 1 application topically 2 (two) times daily.  . hydrochlorothiazide (HYDRODIURIL) 25 MG tablet Take 1 tablet (25 mg total) by mouth daily. Take 1 tablet QAM and 1/2 tablet QPM as needed  . Ivermectin 1 % CREA   . mometasone (ELOCON) 0.1 % cream Apply 1 application topically as needed.  . propranolol ER (INDERAL LA) 60 MG 24 hr capsule Take 1 capsule (60 mg total) by mouth daily.  . SUMAtriptan (IMITREX) 50 MG tablet Take 1 tablet (50 mg total) by mouth every 2 (two) hours as needed for migraine. May repeat in 2 hours if headache persists or recurs.  . tacrolimus (PROTOPIC) 0.1 % ointment APPLY TOPICALLY IN THE MORNING AND AT BEDTIME.  Marland Kitchen Ubrogepant (UBRELVY) 100 MG TABS Take 1 tablet by mouth 2 (two) times daily as needed.  . vitamin E 180 MG (400 UNITS) capsule Take 1 capsule by mouth daily.  . [DISCONTINUED] amphetamine-dextroamphetamine (ADDERALL) 30 MG tablet Take 1 tablet by mouth 2 (two) times daily.  Marland Kitchen amphetamine-dextroamphetamine (ADDERALL) 30 MG tablet Take 1 tablet by mouth 2 (two) times daily.  . [DISCONTINUED] amphetamine-dextroamphetamine (ADDERALL) 30 MG tablet Take 1 tablet by mouth 2 (two) times daily.   No facility-administered encounter medications on file as of 04/05/2020.    Surgical History: Past Surgical History:  Procedure Laterality  Date  . BREAST BIOPSY Left 02/06/2013   stereotactic biopsy of calcifications-benign  . BREAST ENHANCEMENT SURGERY  08/2003  . COLONOSCOPY WITH PROPOFOL N/A 12/14/2016   Procedure: COLONOSCOPY WITH PROPOFOL;  Surgeon: Jonathon Bellows, MD;  Location: [facility] ENDOSCOPY;  Service: Gastroenterology;  Laterality: N/A;  . COLPOSCOPY  03/1998; 07/1999   2000-no lesion; 2001-CIN1  . ENDOMETRIAL BIOPSY  03/2007   proliferative    Medical History: Past Medical History:  Diagnosis Date  . ADD (attention  deficit disorder)   . Anxiety   . Depression   . Dysplastic nevus 11/07/2017   left lat epigastric, right mid post thigh, right lat buttocks, left sup med buttocks  . Dysplastic nevus 10/14/2018   right sup lat buttock  . Dysplastic nevus 04/24/2019   low back spinal, right low back 7.0 cm lat to spine  . Frequent urination   . Headache   . Herpes genitalis   . Hypertension   . Stress headaches   . Stroke ([place]) ~2005    loss of peripheral vision  . Stroke or transient ischemic attack (TIA) diagnosed during current admission   . Urological disorder    bladder has dropped    Family History: Family History  Problem Relation Age of Onset  . Hypertension Father   . Diabetes Father        Has also had an amputation  . Heart attack Father   . Diabetes Mellitus II Father   . Diabetes Mellitus II Brother   . Hypertension Brother   . Breast cancer Maternal Grandmother 60  . Diabetes Mellitus II Paternal Grandmother     Social History   Socioeconomic History  . Marital status: Legally Separated    Spouse name: Not on file  . Number of children: 1  . Years of education: 91  . Highest education level: Not on file  Occupational History  . Occupation: Corporate treasurer  Tobacco Use  . Smoking status: Never Smoker  . Smokeless tobacco: Never Used  Vaping Use  . Vaping Use: Never used  Substance and Sexual Activity  . Alcohol use: No    Alcohol/week: 0.0 standard drinks  . Drug use: No  . Sexual activity: Yes    Birth control/protection: I.U.D.    Comment: Mirena  Other Topics Concern  . Not on file  Social History Narrative  . Not on file   Social Determinants of Health   Financial Resource Strain: Not on file  Food Insecurity: Not on file  Transportation Needs: Not on file  Physical Activity: Not on file  Stress: Not on file  Social Connections: Not on file  Intimate Partner Violence: Not on file      Review of Systems  Constitutional: Positive for  fatigue. Negative for activity change, chills and unexpected weight change.  HENT: Negative for congestion, postnasal drip, rhinorrhea, sneezing and sore throat.   Respiratory: Negative for cough, chest tightness, shortness of breath and wheezing.   Cardiovascular: Negative for chest pain and palpitations.  Gastrointestinal: Negative for abdominal pain, constipation, diarrhea, nausea and vomiting.  Endocrine: Negative for cold intolerance, heat intolerance, polydipsia and polyuria.       Elevated calcium persistent   Musculoskeletal: Positive for arthralgias and myalgias. Negative for back pain, joint swelling and neck pain.       Generalized joint pain and tenderness   Skin: Negative for rash.  Allergic/Immunologic: Negative for environmental allergies.  Neurological: Negative for dizziness, tremors, numbness and headaches.  Hematological: Negative for adenopathy.  Does not bruise/bleed easily.  Psychiatric/Behavioral: Positive for decreased concentration. Negative for behavioral problems (Depression), sleep disturbance and suicidal ideas. The patient is nervous/anxious.     Today's Vitals   04/05/20 0858  BP: 128/82  Pulse: 72  Resp: 16  Temp: 97.8 F (36.6 C)  SpO2: 99%  Weight: 156 lb (70.8 kg)  Height: 5\' 7"  (1.702 m)   Body mass index is 24.43 kg/m.  Physical Exam Vitals and nursing note reviewed.  Constitutional:      General: She is not in acute distress.    Appearance: Normal appearance. She is well-developed and well-nourished. She is not diaphoretic.  HENT:     Head: Normocephalic and atraumatic.     Nose: Nose normal.     Mouth/Throat:     Mouth: Oropharynx is clear and moist.     Pharynx: No oropharyngeal exudate.  Eyes:     Extraocular Movements: EOM normal.     Pupils: Pupils are equal, round, and reactive to light.  Neck:     Thyroid: No thyromegaly.     Vascular: No JVD.     Trachea: No tracheal deviation.  Cardiovascular:     Rate and Rhythm: Normal  rate and regular rhythm.     Heart sounds: Normal heart sounds. No murmur heard. No friction rub. No gallop.   Pulmonary:     Effort: Pulmonary effort is normal. No respiratory distress.     Breath sounds: Normal breath sounds. No wheezing or rales.  Chest:     Chest wall: No tenderness.  Abdominal:     Palpations: Abdomen is soft.  Musculoskeletal:        General: Normal range of motion.     Cervical back: Normal range of motion and neck supple.  Lymphadenopathy:     Cervical: No cervical adenopathy.  Skin:    General: Skin is warm and dry.  Neurological:     Mental Status: She is alert and oriented to person, place, and time.     Cranial Nerves: No cranial nerve deficit.  Psychiatric:        Attention and Perception: Attention and perception normal.        Mood and Affect: Affect normal. Mood is anxious.        Speech: Speech normal.        Behavior: Behavior normal. Behavior is cooperative.        Thought Content: Thought content normal.        Cognition and Memory: Cognition and memory normal.        Judgment: Judgment normal.    Assessment/Plan: 1. Hypercalcemia Reviewed labs. Calcium level still elevated with normal ionized calcium and intact PTH. Will get thyroid/parathyroid ultrasound for further evalution - US THYROID; Future  2. Essential hypertension Stable. Continue HCTZ as prescribed   3. Attention and concentration deficit Doing well with current dose adderall. May continue taking 30mg  twice daily when needed, taking medication holidays on weekends and days off. Two 30 day prescriptions provided. Dates are 04/05/2020 and 05/04/2020.  - amphetamine-dextroamphetamine (ADDERALL) 30 MG tablet; Take 1 tablet by mouth 2 (two) times daily.  Dispense: 60 tablet; Refill: 0  4. Unspecified menopausal and perimenopausal disorder Reviewed labs indicating perimenopause/menopause. Discussed herbal and nature remedies to help symptoms of hot flashes and night sweats. She is not  a candidate for HRT.   General Counseling: Jillian Edwards verbalizes understanding of the findings of todays visit and agrees with plan of treatment. I have discussed any further diagnostic  evaluation that may be needed or ordered today. We also reviewed her medications today. she has been encouraged to call the office with any questions or concerns that should arise related to todays visit.  Refilled Controlled medications today. Reviewed risks and possible side effects associated with taking Stimulants. Combination of these drugs with other psychotropic medications could cause dizziness and drowsiness. Pt needs to Monitor symptoms and exercise caution in driving and operating heavy machinery to avoid damages to oneself, to others and to the surroundings. Patient verbalized understanding in this matter. Dependence and abuse for these drugs will be monitored closely. A Controlled substance policy and procedure is on file which allows [facility] to order a urine drug screen test at any visit. Patient understands and agrees with the plan.  This patient was seen by Leretha Pol FNP Collaboration with Dr Lavera Guise as a part of collaborative care agreement  Orders Placed This Encounter  Procedures  . US THYROID    Meds ordered this encounter  Medications  . DISCONTD: amphetamine-dextroamphetamine (ADDERALL) 30 MG tablet    Sig: Take 1 tablet by mouth 2 (two) times daily.    Dispense:  60 tablet    Refill:  0    Order Specific Question:   Supervising Provider    Answer:   Lavera Guise [id number]  . amphetamine-dextroamphetamine (ADDERALL) 30 MG tablet    Sig: Take 1 tablet by mouth 2 (two) times daily.    Dispense:  60 tablet    Refill:  0    Fill after 05/04/2020    Order Specific Question:   Supervising Provider    Answer:   Lavera Guise [id number]    Total time spent: 30 Minutes   Time spent includes review of chart, medications, test results, and follow up plan with the patient.       Dr Lavera Guise Internal medicine

## 2020-04-14 ENCOUNTER — Other Ambulatory Visit: Payer: Self-pay

## 2020-04-14 ENCOUNTER — Ambulatory Visit: Payer: BLUE CROSS/BLUE SHIELD

## 2020-04-26 ENCOUNTER — Ambulatory Visit: Payer: BLUE CROSS/BLUE SHIELD | Admitting: Dermatology

## 2020-04-27 ENCOUNTER — Encounter: Payer: Self-pay | Admitting: Nurse Practitioner

## 2020-04-27 NOTE — Telephone Encounter (Signed)
Called and spoke with patient regarding her thyroid US results--normal exam. All questions answered at this time.

## 2020-05-12 ENCOUNTER — Other Ambulatory Visit: Payer: Self-pay

## 2020-05-12 DIAGNOSIS — G4452 New daily persistent headache (NDPH): Secondary | ICD-10-CM

## 2020-05-12 MED ORDER — UBRELVY 100 MG PO TABS: 1.0000 | ORAL_TABLET | Freq: Two times a day (BID) | ORAL | 3 refills | Status: DC | PRN

## 2020-05-13 NOTE — Progress Notes (Signed)
This patient is seeing you next.

## 2020-05-17 ENCOUNTER — Other Ambulatory Visit: Payer: Self-pay | Admitting: Nurse Practitioner

## 2020-05-17 DIAGNOSIS — G4452 New daily persistent headache (NDPH): Secondary | ICD-10-CM

## 2020-06-04 ENCOUNTER — Encounter: Payer: Self-pay | Admitting: Hospice and Palliative Medicine

## 2020-06-04 ENCOUNTER — Other Ambulatory Visit: Payer: Self-pay

## 2020-06-04 ENCOUNTER — Ambulatory Visit: Payer: BLUE CROSS/BLUE SHIELD | Admitting: Hospice and Palliative Medicine

## 2020-06-04 DIAGNOSIS — Z79899 Other long term (current) drug therapy: Secondary | ICD-10-CM

## 2020-06-04 DIAGNOSIS — R41840 Attention and concentration deficit: Secondary | ICD-10-CM

## 2020-06-04 DIAGNOSIS — F5101 Primary insomnia: Secondary | ICD-10-CM

## 2020-06-04 DIAGNOSIS — E2839 Other primary ovarian failure: Secondary | ICD-10-CM

## 2020-06-04 DIAGNOSIS — F411 Generalized anxiety disorder: Secondary | ICD-10-CM

## 2020-06-04 DIAGNOSIS — I10 Essential (primary) hypertension: Secondary | ICD-10-CM | POA: Diagnosis not present

## 2020-06-04 DIAGNOSIS — H1045 Other chronic allergic conjunctivitis: Secondary | ICD-10-CM

## 2020-06-04 LAB — POCT URINE DRUG SCREEN
Methylenedioxyamphetamine: NOT DETECTED
POC Amphetamine UR: POSITIVE — AB
POC BENZODIAZEPINES UR: NOT DETECTED
POC Barbiturate UR: NOT DETECTED
POC Cocaine UR: NOT DETECTED
POC Ecstasy UR: NOT DETECTED
POC Marijuana UR: NOT DETECTED
POC Methadone UR: NOT DETECTED
POC Methamphetamine UR: NOT DETECTED
POC Opiate Ur: NOT DETECTED
POC Oxycodone UR: NOT DETECTED
POC PHENCYCLIDINE UR: NOT DETECTED
POC TRICYCLICS UR: NOT DETECTED

## 2020-06-04 MED ORDER — AMPHETAMINE-DEXTROAMPHET ER 30 MG PO CP24: 30.0000 mg | ORAL_CAPSULE | Freq: Every day | ORAL | 0 refills | Status: DC

## 2020-06-04 MED ORDER — ESCITALOPRAM OXALATE 10 MG PO TABS: 10.0000 mg | ORAL_TABLET | Freq: Every day | ORAL | 0 refills | Status: DC

## 2020-06-04 MED ORDER — AZELASTINE HCL 0.1 % NA SOLN
1.0000 | Freq: Two times a day (BID) | NASAL | 3 refills | Status: DC
Start: 2020-06-04 — End: 2023-12-26

## 2020-06-04 MED ORDER — AMPHETAMINE-DEXTROAMPHETAMINE 10 MG PO TABS: 10.0000 mg | ORAL_TABLET | Freq: Every day | ORAL | 0 refills | Status: DC | PRN

## 2020-06-04 MED ORDER — PROPRANOLOL HCL ER 60 MG PO CP24: 60.0000 mg | ORAL_CAPSULE | Freq: Every day | ORAL | 3 refills | Status: DC

## 2020-06-04 MED ORDER — MONTELUKAST SODIUM 10 MG PO TABS: 10.0000 mg | ORAL_TABLET | Freq: Every day | ORAL | 3 refills | Status: DC

## 2020-06-04 NOTE — Progress Notes (Signed)
[facility] [place], [place] [address]  Internal MEDICINE  Office Visit Note  Patient Name: Jillian Edwards  [id number]  [id number]  Date of Service: 06/07/2020  Chief Complaint  Patient presents with  . Follow-up  . Sinusitis  . Depression  . Hypertension    HPI Patient is here for routine follow-up Reviewed thyroid US--ordered due to elevated calcium levels, normal [place], intact PTH and ionized calcium within normal limits Had been taking OTC TUMS BID per GYN recommendation for calcium supplement--has since stopped taking this medication Continues to struggle with severe anxiety and high stress levels  Has been on Wellbutrin for several years and has never felt that it has helped control her anxiety symptoms Discussed Adderall--she is currently on a significantly high dose, 30 mg BID--has been on this dose for over a year Discussed risks associated with prolonged use of stimulant therapy, history of TIA  Sleep is poor--has trouble falling and staying asleep, has had this issues for many years  C/o nasal congestion, sinus pressure and PND--history of seasonal allergies--currently only taking OTC Claritin for her symptoms  Current Medication: Outpatient Encounter Medications as of 06/04/2020  Medication Sig  . ALPRAZolam (XANAX) 0.5 MG tablet Take 1 tablet (0.5 mg total) by mouth 2 (two) times daily as needed for anxiety.  Marland Kitchen amphetamine-dextroamphetamine (ADDERALL XR) 30 MG 24 hr capsule Take 1 capsule (30 mg total) by mouth daily.  Marland Kitchen amphetamine-dextroamphetamine (ADDERALL) 10 MG tablet Take 1 tablet (10 mg total) by mouth daily as needed.  Marland Kitchen aspirin EC 81 MG tablet Take 81 mg by mouth daily.  Marland Kitchen azelastine (ASTELIN) 0.1 % nasal spray Place 1 spray into both nostrils 2 (two) times daily. Use in each nostril as directed  . cholecalciferol (VITAMIN D) 1000 units tablet Take 2,000 Units by mouth daily.   . clotrimazole-betamethasone (LOTRISONE) cream Apply 1  application topically 2 (two) times daily.  Marland Kitchen escitalopram (LEXAPRO) 10 MG tablet Take 1 tablet (10 mg total) by mouth daily.  . hydrochlorothiazide (HYDRODIURIL) 25 MG tablet Take 1 tablet (25 mg total) by mouth daily. Take 1 tablet QAM and 1/2 tablet QPM as needed  . Ivermectin 1 % CREA   . mometasone (ELOCON) 0.1 % cream Apply 1 application topically as needed.  . montelukast (SINGULAIR) 10 MG tablet Take 1 tablet (10 mg total) by mouth at bedtime.  . SUMAtriptan (IMITREX) 50 MG tablet TAKE 1 TAB EVERY 2 HOURS AS NEEDED FOR MIGRAINE. MAY REPEAT IN 2 HRS IF HEADACHE PERSISTS OR RECURS  . tacrolimus (PROTOPIC) 0.1 % ointment APPLY TOPICALLY IN THE MORNING AND AT BEDTIME.  Marland Kitchen Ubrogepant (UBRELVY) 100 MG TABS Take 1 tablet by mouth 2 (two) times daily as needed. Dx: L93.570, G44.52  . vitamin E 180 MG (400 UNITS) capsule Take 1 capsule by mouth daily.  . [DISCONTINUED] amphetamine-dextroamphetamine (ADDERALL) 30 MG tablet Take 1 tablet by mouth 2 (two) times daily.  . [DISCONTINUED] buPROPion (WELLBUTRIN XL) 150 MG 24 hr tablet Take 2 tablets by mouth daily  . [DISCONTINUED] calcium carbonate (TUMS - DOSED IN MG ELEMENTAL CALCIUM) 500 MG chewable tablet Chew 1 tablet by mouth daily.  . [DISCONTINUED] propranolol ER (INDERAL LA) 60 MG 24 hr capsule Take 1 capsule (60 mg total) by mouth daily.  . propranolol ER (INDERAL LA) 60 MG 24 hr capsule Take 1 capsule (60 mg total) by mouth daily.   No facility-administered encounter medications on file as of 06/04/2020.    Surgical History:  Past Surgical History:  Procedure Laterality Date  . BREAST BIOPSY Left 02/06/2013   stereotactic biopsy of calcifications-benign  . BREAST ENHANCEMENT SURGERY  08/2003  . COLONOSCOPY WITH PROPOFOL N/A 12/14/2016   Procedure: COLONOSCOPY WITH PROPOFOL;  Surgeon: Jonathon Bellows, MD;  Location: [facility] ENDOSCOPY;  Service: Gastroenterology;  Laterality: N/A;  . COLPOSCOPY  03/1998; 07/1999   2000-no lesion; 2001-CIN1  .  ENDOMETRIAL BIOPSY  03/2007   proliferative    Medical History: Past Medical History:  Diagnosis Date  . ADD (attention deficit disorder)   . Anxiety   . Depression   . Dysplastic nevus 11/07/2017   left lat epigastric, right mid post thigh, right lat buttocks, left sup med buttocks  . Dysplastic nevus 10/14/2018   right sup lat buttock  . Dysplastic nevus 04/24/2019   low back spinal, right low back 7.0 cm lat to spine  . Frequent urination   . Headache   . Herpes genitalis   . Hypertension   . Stress headaches   . Stroke ([place]) ~2005    loss of peripheral vision  . Stroke or transient ischemic attack (TIA) diagnosed during current admission   . Urological disorder    bladder has dropped    Family History: Family History  Problem Relation Age of Onset  . Hypertension Father   . Diabetes Father        Has also had an amputation  . Heart attack Father   . Diabetes Mellitus II Father   . Diabetes Mellitus II Brother   . Hypertension Brother   . Breast cancer Maternal Grandmother 60  . Diabetes Mellitus II Paternal Grandmother     Social History   Socioeconomic History  . Marital status: Legally Separated    Spouse name: Not on file  . Number of children: 1  . Years of education: 24  . Highest education level: Not on file  Occupational History  . Occupation: Corporate treasurer  Tobacco Use  . Smoking status: Never Smoker  . Smokeless tobacco: Never Used  Vaping Use  . Vaping Use: Never used  Substance and Sexual Activity  . Alcohol use: No    Alcohol/week: 0.0 standard drinks  . Drug use: No  . Sexual activity: Yes    Birth control/protection: I.U.D.    Comment: Mirena  Other Topics Concern  . Not on file  Social History Narrative  . Not on file   Social Determinants of Health   Financial Resource Strain: Not on file  Food Insecurity: Not on file  Transportation Needs: Not on file  Physical Activity: Not on file  Stress: Not on file   Social Connections: Not on file  Intimate Partner Violence: Not on file      Review of Systems  Constitutional: Negative for chills, diaphoresis and fatigue.  HENT: Positive for postnasal drip, rhinorrhea and sinus pressure. Negative for ear pain.   Eyes: Positive for itching. Negative for photophobia, discharge, redness and visual disturbance.  Respiratory: Negative for cough, shortness of breath and wheezing.   Cardiovascular: Negative for chest pain, palpitations and leg swelling.  Gastrointestinal: Negative for abdominal pain, constipation, diarrhea, nausea and vomiting.  Genitourinary: Negative for dysuria and flank pain.  Musculoskeletal: Negative for arthralgias, back pain, gait problem and neck pain.  Skin: Negative for color change.  Allergic/Immunologic: Negative for environmental allergies and food allergies.  Neurological: Negative for dizziness and headaches.  Hematological: Does not bruise/bleed easily.  Psychiatric/Behavioral: Positive for behavioral problems (depression) and sleep disturbance. Negative  for agitation and hallucinations. The patient is nervous/anxious.     Vital Signs: BP 130/84   Pulse 72   Temp 97.8 F (36.6 C)   Resp 16   Ht 5\' 7"  (1.702 m)   Wt 151 lb (68.5 kg)   SpO2 95%   BMI 23.65 kg/m    Physical Exam Vitals reviewed.  Constitutional:      Appearance: Normal appearance. She is normal weight.  Cardiovascular:     Rate and Rhythm: Normal rate and regular rhythm.     Pulses: Normal pulses.     Heart sounds: Normal heart sounds.  Pulmonary:     Effort: Pulmonary effort is normal.     Breath sounds: Normal breath sounds.  Abdominal:     General: Abdomen is flat.     Palpations: Abdomen is soft.  Musculoskeletal:        General: Normal range of motion.     Cervical back: Normal range of motion.  Skin:    General: Skin is warm.  Neurological:     General: No focal deficit present.     Mental Status: She is alert and oriented to  person, place, and time. Mental status is at baseline.  Psychiatric:        Mood and Affect: Mood normal.        Behavior: Behavior normal.        Thought Content: Thought content normal.        Judgment: Judgment normal.   Assessment/Plan: 1. Hypercalcemia Has stopped OTC TUMS, will recheck levels, thyroid US normal, intact PTH and ionized calcium normal levels - Comprehensive Metabolic Panel (CMET)  2. Essential hypertension BP and HR well controlled, requesting refills - propranolol ER (INDERAL LA) 60 MG 24 hr capsule; Take 1 capsule (60 mg total) by mouth daily.  Dispense: 30 capsule; Refill: 3  3. GAD (generalized anxiety disorder) Discontinue Wellbutrin and start Lexapro for uncontrolled anxiety//close follow-up for monitoring of reponse - escitalopram (LEXAPRO) 10 MG tablet; Take 1 tablet (10 mg total) by mouth daily.  Dispense: 90 tablet; Refill: 0  4. Primary insomnia Likely multi-factorial--increased dose of stimulant therapy as well as uncontrolled anxiety--will work on optimizing these issues Advised to try OTC Melatonin to help with insomnia symptoms  5. Chronic allergic conjunctivitis Continue with OTC Claritin/Zyrtec, add Montelukast as well as Flonase Consider allergy testing - montelukast (SINGULAIR) 10 MG tablet; Take 1 tablet (10 mg total) by mouth at bedtime.  Dispense: 90 tablet; Refill: 3 - azelastine (ASTELIN) 0.1 % nasal spray; Place 1 spray into both nostrils 2 (two) times daily. Use in each nostril as directed  Dispense: 30 mL; Refill: 3  6. Attention and concentration deficit Willing to work on weaning down stimulant dosing Stop current dosing, start 30 mg XR and may supplement with 10 mg IR as needed for symptom control Appears symptoms closely related to uncontrolled anxiety--will optimize therapy for controlling symptoms [place] Controlled Substance Database was reviewed by me for overdose risk score (ORS) - amphetamine-dextroamphetamine (ADDERALL XR) 30 MG  24 hr capsule; Take 1 capsule (30 mg total) by mouth daily.  Dispense: 30 capsule; Refill: 0 - amphetamine-dextroamphetamine (ADDERALL) 10 MG tablet; Take 1 tablet (10 mg total) by mouth daily as needed.  Dispense: 30 tablet; Refill: 0  7. Primary ovarian failure - DG Bone Density; Future  8. Encounter for long-term (current) use of medications - POCT Urine Drug Screen  General Counseling: Bryant verbalizes understanding of the findings of todays visit and  agrees with plan of treatment. I have discussed any further diagnostic evaluation that may be needed or ordered today. We also reviewed her medications today. she has been encouraged to call the office with any questions or concerns that should arise related to todays visit.    Orders Placed This Encounter  Procedures  . DG Bone Density  . Comprehensive Metabolic Panel (CMET)  . POCT Urine Drug Screen    Meds ordered this encounter  Medications  . montelukast (SINGULAIR) 10 MG tablet    Sig: Take 1 tablet (10 mg total) by mouth at bedtime.    Dispense:  90 tablet    Refill:  3  . azelastine (ASTELIN) 0.1 % nasal spray    Sig: Place 1 spray into both nostrils 2 (two) times daily. Use in each nostril as directed    Dispense:  30 mL    Refill:  3  . escitalopram (LEXAPRO) 10 MG tablet    Sig: Take 1 tablet (10 mg total) by mouth daily.    Dispense:  90 tablet    Refill:  0  . amphetamine-dextroamphetamine (ADDERALL XR) 30 MG 24 hr capsule    Sig: Take 1 capsule (30 mg total) by mouth daily.    Dispense:  30 capsule    Refill:  0  . amphetamine-dextroamphetamine (ADDERALL) 10 MG tablet    Sig: Take 1 tablet (10 mg total) by mouth daily as needed.    Dispense:  30 tablet    Refill:  0  . propranolol ER (INDERAL LA) 60 MG 24 hr capsule    Sig: Take 1 capsule (60 mg total) by mouth daily.    Dispense:  30 capsule    Refill:  3    Time spent: 30 Minutes Time spent includes review of chart, medications, test results and  follow-up plan with the patient.  This patient was seen by Theodoro Grist AGNP-C in Collaboration with Dr Lavera Guise as a part of collaborative care agreement     Tanna Furry. Anjanae Woehrle AGNP-C Internal medicine

## 2020-06-07 ENCOUNTER — Encounter: Payer: Self-pay | Admitting: Hospice and Palliative Medicine

## 2020-06-08 ENCOUNTER — Encounter: Payer: Self-pay | Admitting: Hospice and Palliative Medicine

## 2020-06-17 ENCOUNTER — Other Ambulatory Visit: Payer: Self-pay

## 2020-06-17 ENCOUNTER — Ambulatory Visit: Payer: BLUE CROSS/BLUE SHIELD | Admitting: Dermatology

## 2020-06-17 DIAGNOSIS — D1800 Hemangioma unspecified site: Secondary | ICD-10-CM

## 2020-06-17 DIAGNOSIS — L578 Other skin changes due to chronic exposure to nonionizing radiation: Secondary | ICD-10-CM

## 2020-06-17 DIAGNOSIS — L820 Inflamed seborrheic keratosis: Secondary | ICD-10-CM | POA: Diagnosis not present

## 2020-06-17 DIAGNOSIS — D229 Melanocytic nevi, unspecified: Secondary | ICD-10-CM

## 2020-06-17 DIAGNOSIS — L409 Psoriasis, unspecified: Secondary | ICD-10-CM | POA: Diagnosis not present

## 2020-06-17 DIAGNOSIS — Z1283 Encounter for screening for malignant neoplasm of skin: Secondary | ICD-10-CM

## 2020-06-17 DIAGNOSIS — L408 Other psoriasis: Secondary | ICD-10-CM

## 2020-06-17 DIAGNOSIS — L719 Rosacea, unspecified: Secondary | ICD-10-CM

## 2020-06-17 DIAGNOSIS — L821 Other seborrheic keratosis: Secondary | ICD-10-CM

## 2020-06-17 DIAGNOSIS — L814 Other melanin hyperpigmentation: Secondary | ICD-10-CM

## 2020-06-17 MED ORDER — MOMETASONE FUROATE 0.1 % EX CREA: 1.0000 "application " | TOPICAL_CREAM | Freq: Every day | CUTANEOUS | 6 refills | Status: DC | PRN

## 2020-06-17 NOTE — Progress Notes (Signed)
Follow-Up Visit   Subjective  Jillian Edwards is a 55 y.o. female who presents for the following: Annual Exam (History of dysplastic nevi - TBSE today). The patient presents for Total-Body Skin Exam (TBSE) for skin cancer screening and mole check.  The following portions of the chart were reviewed this encounter and updated as appropriate:   Tobacco  Allergies  Meds  Problems  Med Hx  Surg Hx  Fam Hx     Review of Systems:  No other skin or systemic complaints except as noted in HPI or Assessment and Plan.  Objective  Well appearing patient in no apparent distress; mood and affect are within normal limits.  A full examination was performed including scalp, head, eyes, ears, nose, lips, neck, chest, axillae, abdomen, back, buttocks, bilateral upper extremities, bilateral lower extremities, hands, feet, fingers, toes, fingernails, and toenails. All findings within normal limits unless otherwise noted below.  Objective  Elbows, brow area: Pinkness and scale  Objective  Head - Anterior (Face): Pinkness and dilated blood vessels  Objective  Lower legs, left dorsum foot (20): Erythematous keratotic or waxy stuck-on papule or plaque.    Assessment & Plan  Psoriasis Elbows, brow area Psoriasis and Sebopsoriasis  Mometasone cream qd 3 days per week prn  Psoriasis is a chronic non-curable, but treatable genetic/hereditary disease that may have other systemic features affecting other organ systems such as joints (Psoriatic Arthritis). It is associated with an increased risk of inflammatory bowel disease, heart disease, non-alcoholic fatty liver disease, and depression.    mometasone (ELOCON) 0.1 % cream - Elbows, brow area  Rosacea Head - Anterior (Face) Will prescribe [facility] metronidazole/ivermectin/azelaic acid twice daily as needed to affected areas on the face. The patient was advised this is not covered by insurance since it is made by a compounding pharmacy.  They will receive an email to check out and the medication will be mailed to their home.    Rosacea is a chronic progressive skin condition usually affecting the face of adults, causing redness and/or acne bumps. It is treatable but not curable. It sometimes affects the eyes (ocular rosacea) as well. It may respond to topical and/or systemic medication and can flare with stress, sun exposure, alcohol, exercise and some foods.  Daily application of broad spectrum spf 30+ sunscreen to face is recommended to reduce flares.  Inflamed seborrheic keratosis (20) Lower legs, left dorsum foot  Destruction of lesion - Lower legs, left dorsum foot Complexity: simple   Destruction method: cryotherapy   Informed consent: discussed and consent obtained   Timeout:  patient name, date of birth, surgical site, and procedure verified Lesion destroyed using liquid nitrogen: Yes   Region frozen until ice ball extended beyond lesion: Yes   Outcome: patient tolerated procedure well with no complications   Post-procedure details: wound care instructions given    Skin cancer screening   Lentigines - Scattered tan macules - Due to sun exposure - Benign-appering, observe - Recommend daily broad spectrum sunscreen SPF 30+ to sun-exposed areas, reapply every 2 hours as needed. - Call for any changes  Seborrheic Keratoses - Stuck-on, waxy, tan-brown papules and plaques  - Discussed benign etiology and prognosis. - Observe - Call for any changes  Melanocytic Nevi - Tan-brown and/or pink-flesh-colored symmetric macules and papules - some dark - Benign appearing on exam today - Observation - Call clinic for new or changing moles - Recommend daily use of broad spectrum spf 30+ sunscreen to sun-exposed areas.  Hemangiomas - Red papules - Discussed benign nature - Observe - Call for any changes  Actinic Damage - Chronic, secondary to cumulative UV/sun exposure - diffuse scaly erythematous macules with  underlying dyspigmentation - Recommend daily broad spectrum sunscreen SPF 30+ to sun-exposed areas, reapply every 2 hours as needed.  - Call for new or changing lesions.  Skin cancer screening performed today.  Return in about 6 months (around 12/18/2020).  I, Ashok Cordia, CMA, am acting as scribe for Sarina Ser, MD .  Documentation: I have reviewed the above documentation for accuracy and completeness, and I agree with the above.  Sarina Ser, MD

## 2020-06-17 NOTE — Patient Instructions (Addendum)
Instructions for [facility] Medications  One or more of your medications was sent to the [facility] mail order compounding pharmacy. You will receive an email from them and can purchase the medicine through that link. It will then be mailed to your home at the address you confirmed. If for any reason you do not receive an email from them, please check your spam folder. If you still do not find the email, please let us know. [facility] phone number is [phone].    Cryotherapy Aftercare  Wash gently with soap and water everyday.   Apply Vaseline and Band-Aid daily until healed.  

## 2020-06-22 ENCOUNTER — Encounter: Payer: Self-pay | Admitting: Dermatology

## 2020-06-28 ENCOUNTER — Other Ambulatory Visit: Payer: Self-pay | Admitting: Hospice and Palliative Medicine

## 2020-06-29 ENCOUNTER — Telehealth: Payer: Self-pay

## 2020-06-29 NOTE — Telephone Encounter (Signed)
Faxed over patients Mammogram and Bone Density orders to [facility] in [place] at [phone]. Jillian Edwards

## 2020-07-01 ENCOUNTER — Other Ambulatory Visit: Payer: Self-pay | Admitting: Nurse Practitioner

## 2020-07-01 DIAGNOSIS — F411 Generalized anxiety disorder: Secondary | ICD-10-CM

## 2020-07-07 ENCOUNTER — Telehealth: Payer: Self-pay

## 2020-07-07 DIAGNOSIS — G4452 New daily persistent headache (NDPH): Secondary | ICD-10-CM

## 2020-07-07 MED ORDER — UBRELVY 100 MG PO TABS: 1.0000 | ORAL_TABLET | Freq: Two times a day (BID) | ORAL | 3 refills | Status: DC | PRN

## 2020-07-07 NOTE — Telephone Encounter (Signed)
PA was approved for UBRELVY 100 mg valid 06/09/20 to 06/08/21

## 2020-07-12 ENCOUNTER — Ambulatory Visit: Payer: BLUE CROSS/BLUE SHIELD | Admitting: Hospice and Palliative Medicine

## 2020-07-12 ENCOUNTER — Encounter: Payer: Self-pay | Admitting: Physician Assistant

## 2020-07-12 ENCOUNTER — Other Ambulatory Visit: Payer: Self-pay

## 2020-07-12 VITALS — BP 130/80 | HR 88 | Temp 97.9°F | Resp 16 | Ht 67.0 in | Wt 147.6 lb

## 2020-07-12 DIAGNOSIS — H1045 Other chronic allergic conjunctivitis: Secondary | ICD-10-CM

## 2020-07-12 DIAGNOSIS — R41840 Attention and concentration deficit: Secondary | ICD-10-CM | POA: Diagnosis not present

## 2020-07-12 DIAGNOSIS — F411 Generalized anxiety disorder: Secondary | ICD-10-CM

## 2020-07-12 MED ORDER — AMPHETAMINE-DEXTROAMPHETAMINE 20 MG PO TABS: 20.0000 mg | ORAL_TABLET | Freq: Two times a day (BID) | ORAL | 0 refills | Status: DC

## 2020-07-12 MED ORDER — ESCITALOPRAM OXALATE 10 MG PO TABS: 10.0000 mg | ORAL_TABLET | Freq: Every day | ORAL | 0 refills | Status: DC

## 2020-07-12 MED ORDER — MONTELUKAST SODIUM 10 MG PO TABS: 10.0000 mg | ORAL_TABLET | Freq: Every day | ORAL | 3 refills | Status: DC

## 2020-07-12 NOTE — Progress Notes (Signed)
[facility] [place], [place] [address]  Internal MEDICINE  Office Visit Note  Patient Name: Jillian Edwards  [id number]  [id number]  Date of Service: 07/15/2020  Chief Complaint  Patient presents with  . Follow-up  . Depression  . Hypertension    HPI Patient is here for routine follow-up Overall, she is feeling better, less stress and anxiety since our last visit -Wellbutrin switched to Lexapro at our last visit, she has noticed a significant difference in her anxiety levels, she feels less irritated and able to control her stress from her job better, has also noticed some improvement in her sleep -ADD-Adderall dosing was adjusted at last visit, tried on extended release adderall which was not successful, even with additional IR for as needed use in the afternoons she found it hard to stay focused and able concentrate throughout the afternoon at her job--has started drinking more caffeine and has had to bring work home in the evenings to get it done as she was unable to stay focused enough to get her work done in the afternoons  Picked up script from pharmacy and 30 mg IR BID was refilled from old script, has been taking previous prescription for a few days and has noticed an improvement in her symptoms  Scheduled for mammogram and bone density later this month Has not yet had a chance to repeat her labs but plans to do this today as she is off from work today  Current Medication: Outpatient Encounter Medications as of 07/12/2020  Medication Sig  . ALPRAZolam (XANAX) 0.5 MG tablet Take 1 tablet (0.5 mg total) by mouth 2 (two) times daily as needed for anxiety.  Marland Kitchen amphetamine-dextroamphetamine (ADDERALL) 20 MG tablet Take 1 tablet (20 mg total) by mouth 2 (two) times daily.  Marland Kitchen amphetamine-dextroamphetamine (ADDERALL) 20 MG tablet Take 1 tablet (20 mg total) by mouth 2 (two) times daily.  Marland Kitchen aspirin EC 81 MG tablet Take 81 mg by mouth daily.  Marland Kitchen azelastine  (ASTELIN) 0.1 % nasal spray Place 1 spray into both nostrils 2 (two) times daily. Use in each nostril as directed  . cholecalciferol (VITAMIN D) 1000 units tablet Take 2,000 Units by mouth daily.   . clotrimazole-betamethasone (LOTRISONE) cream Apply 1 application topically 2 (two) times daily.  . hydrochlorothiazide (HYDRODIURIL) 25 MG tablet Take 1 tablet (25 mg total) by mouth daily. Take 1 tablet QAM and 1/2 tablet QPM as needed  . Ivermectin 1 % CREA   . mometasone (ELOCON) 0.1 % cream Apply 1 application topically as needed.  . mometasone (ELOCON) 0.1 % cream Apply 1 application topically daily as needed (Rash). 3 days per week prn to elbows and brow area  . montelukast (SINGULAIR) 10 MG tablet Take 1 tablet (10 mg total) by mouth at bedtime.  . montelukast (SINGULAIR) 10 MG tablet Take 1 tablet (10 mg total) by mouth at bedtime.  . propranolol ER (INDERAL LA) 60 MG 24 hr capsule Take 1 capsule (60 mg total) by mouth daily.  . SUMAtriptan (IMITREX) 50 MG tablet TAKE 1 TAB EVERY 2 HOURS AS NEEDED FOR MIGRAINE. MAY REPEAT IN 2 HRS IF HEADACHE PERSISTS OR RECURS  . tacrolimus (PROTOPIC) 0.1 % ointment APPLY TOPICALLY IN THE MORNING AND AT BEDTIME.  Marland Kitchen Ubrogepant (UBRELVY) 100 MG TABS Take 1 tablet by mouth 2 (two) times daily as needed. Dx: G95.621, G44.52  . vitamin E 180 MG (400 UNITS) capsule Take 1 capsule by mouth daily.  . [DISCONTINUED] amphetamine-dextroamphetamine (  ADDERALL XR) 30 MG 24 hr capsule Take 1 capsule (30 mg total) by mouth daily.  . [DISCONTINUED] amphetamine-dextroamphetamine (ADDERALL) 10 MG tablet Take 1 tablet (10 mg total) by mouth daily as needed.  . [DISCONTINUED] escitalopram (LEXAPRO) 10 MG tablet Take 1 tablet (10 mg total) by mouth daily.  Marland Kitchen escitalopram (LEXAPRO) 10 MG tablet Take 1 tablet (10 mg total) by mouth daily.   No facility-administered encounter medications on file as of 07/12/2020.    Surgical History: Past Surgical History:  Procedure Laterality  Date  . BREAST BIOPSY Left 02/06/2013   stereotactic biopsy of calcifications-benign  . BREAST ENHANCEMENT SURGERY  08/2003  . COLONOSCOPY WITH PROPOFOL N/A 12/14/2016   Procedure: COLONOSCOPY WITH PROPOFOL;  Surgeon: Jonathon Bellows, MD;  Location: [facility] ENDOSCOPY;  Service: Gastroenterology;  Laterality: N/A;  . COLPOSCOPY  03/1998; 07/1999   2000-no lesion; 2001-CIN1  . ENDOMETRIAL BIOPSY  03/2007   proliferative    Medical History: Past Medical History:  Diagnosis Date  . ADD (attention deficit disorder)   . Anxiety   . Depression   . Dysplastic nevus 11/07/2017   left lat epigastric, right mid post thigh, right lat buttocks, left sup med buttocks  . Dysplastic nevus 10/14/2018   right sup lat buttock  . Dysplastic nevus 04/24/2019   low back spinal, right low back 7.0 cm lat to spine  . Frequent urination   . Headache   . Herpes genitalis   . Hypertension   . Stress headaches   . Stroke ([place]) ~2005    loss of peripheral vision  . Stroke or transient ischemic attack (TIA) diagnosed during current admission   . Urological disorder    bladder has dropped    Family History: Family History  Problem Relation Age of Onset  . Hypertension Father   . Diabetes Father        Has also had an amputation  . Heart attack Father   . Diabetes Mellitus II Father   . Diabetes Mellitus II Brother   . Hypertension Brother   . Breast cancer Maternal Grandmother 60  . Diabetes Mellitus II Paternal Grandmother     Social History   Socioeconomic History  . Marital status: Legally Separated    Spouse name: Not on file  . Number of children: 1  . Years of education: 34  . Highest education level: Not on file  Occupational History  . Occupation: Corporate treasurer  Tobacco Use  . Smoking status: Never Smoker  . Smokeless tobacco: Never Used  Vaping Use  . Vaping Use: Never used  Substance and Sexual Activity  . Alcohol use: No    Alcohol/week: 0.0 standard drinks  . Drug  use: No  . Sexual activity: Yes    Birth control/protection: I.U.D.    Comment: Mirena  Other Topics Concern  . Not on file  Social History Narrative  . Not on file   Social Determinants of Health   Financial Resource Strain: Not on file  Food Insecurity: Not on file  Transportation Needs: Not on file  Physical Activity: Not on file  Stress: Not on file  Social Connections: Not on file  Intimate Partner Violence: Not on file      Review of Systems  Constitutional: Negative for chills, diaphoresis and fatigue.  HENT: Negative for ear pain, postnasal drip and sinus pressure.   Eyes: Negative for photophobia, discharge, redness, itching and visual disturbance.  Respiratory: Negative for cough, shortness of breath and wheezing.  Cardiovascular: Negative for chest pain, palpitations and leg swelling.  Gastrointestinal: Negative for abdominal pain, constipation, diarrhea, nausea and vomiting.  Genitourinary: Negative for dysuria and flank pain.  Musculoskeletal: Negative for arthralgias, back pain, gait problem and neck pain.  Skin: Negative for color change.  Allergic/Immunologic: Negative for environmental allergies and food allergies.  Neurological: Negative for dizziness and headaches.  Hematological: Does not bruise/bleed easily.  Psychiatric/Behavioral: Negative for agitation, behavioral problems (depression) and hallucinations.    Vital Signs: BP 130/80   Pulse 88   Temp 97.9 F (36.6 C)   Resp 16   Ht 5\' 7"  (1.702 m)   Wt 147 lb 9.6 oz (67 kg)   SpO2 97%   BMI 23.12 kg/m    Physical Exam Vitals reviewed.  Constitutional:      Appearance: Normal appearance. She is normal weight.  Cardiovascular:     Rate and Rhythm: Normal rate and regular rhythm.     Pulses: Normal pulses.     Heart sounds: Normal heart sounds.  Pulmonary:     Effort: Pulmonary effort is normal.     Breath sounds: Normal breath sounds.  Abdominal:     General: Abdomen is flat.      Palpations: Abdomen is soft.  Musculoskeletal:        General: Normal range of motion.     Cervical back: Normal range of motion.  Skin:    General: Skin is warm.  Neurological:     General: No focal deficit present.     Mental Status: She is alert and oriented to person, place, and time. Mental status is at baseline.  Psychiatric:        Mood and Affect: Mood normal.        Behavior: Behavior normal.        Thought Content: Thought content normal.        Judgment: Judgment normal.    Assessment/Plan: 1. Chronic allergic conjunctivitis Stable at this time, requesting refills - montelukast (SINGULAIR) 10 MG tablet; Take 1 tablet (10 mg total) by mouth at bedtime.  Dispense: 30 tablet; Refill: 3  2. GAD (generalized anxiety disorder) Improvement in anxiety levels since starting Lexapro--continue with current dosing and continue to monitor - escitalopram (LEXAPRO) 10 MG tablet; Take 1 tablet (10 mg total) by mouth daily.  Dispense: 90 tablet; Refill: 0  3. Attention and concentration deficit Did not tolerate extended release form of Adderall, willing to wean down dosing of Adderall, will lower to 20 mg BID, encouraged to use second dose as needed [place] Controlled Substance Database was reviewed by me for overdose risk score (ORS) Reviewed risks and possible side effects associated with taking opiates, benzodiazepines and other CNS depressants. Combination of these could cause dizziness and drowsiness. Advised patient not to drive or operate machinery when taking these medications, as patient's and other's life can be at risk and will have consequences. Patient verbalized understanding in this matter. Dependence and abuse for these drugs will be monitored closely. A Controlled substance policy and procedure is on file which allows [facility] to order a urine drug screen test at any visit. Patient understands and agrees with the plan - amphetamine-dextroamphetamine (ADDERALL) 20 MG  tablet; Take 1 tablet (20 mg total) by mouth 2 (two) times daily.  Dispense: 60 tablet; Refill: 0 - amphetamine-dextroamphetamine (ADDERALL) 20 MG tablet; Take 1 tablet (20 mg total) by mouth 2 (two) times daily.  Dispense: 60 tablet; Refill: 0  General Counseling: Neeley verbalizes understanding  of the findings of todays visit and agrees with plan of treatment. I have discussed any further diagnostic evaluation that may be needed or ordered today. We also reviewed her medications today. she has been encouraged to call the office with any questions or concerns that should arise related to todays visit.   Meds ordered this encounter  Medications  . montelukast (SINGULAIR) 10 MG tablet    Sig: Take 1 tablet (10 mg total) by mouth at bedtime.    Dispense:  30 tablet    Refill:  3  . amphetamine-dextroamphetamine (ADDERALL) 20 MG tablet    Sig: Take 1 tablet (20 mg total) by mouth 2 (two) times daily.    Dispense:  60 tablet    Refill:  0  . amphetamine-dextroamphetamine (ADDERALL) 20 MG tablet    Sig: Take 1 tablet (20 mg total) by mouth 2 (two) times daily.    Dispense:  60 tablet    Refill:  0    Do not fill prior to May 18th  . escitalopram (LEXAPRO) 10 MG tablet    Sig: Take 1 tablet (10 mg total) by mouth daily.    Dispense:  90 tablet    Refill:  0    Time spent: 30 Minutes Time spent includes review of chart, medications, test results and follow-up plan with the patient.  This patient was seen by Theodoro Grist AGNP-C in Collaboration with Dr Lavera Guise as a part of collaborative care agreement     Tanna Furry. Avrey Hyser AGNP-C Internal medicine

## 2020-07-15 ENCOUNTER — Encounter: Payer: Self-pay | Admitting: Hospice and Palliative Medicine

## 2020-08-05 ENCOUNTER — Encounter: Payer: Self-pay | Admitting: Hospice and Palliative Medicine

## 2020-08-16 ENCOUNTER — Other Ambulatory Visit: Payer: Self-pay

## 2020-08-16 ENCOUNTER — Ambulatory Visit
Admission: EM | Admit: 2020-08-16 | Discharge: 2020-08-16 | Disposition: A | Discharge status: Home / Self Care | Payer: BLUE CROSS/BLUE SHIELD | Attending: Emergency Medicine | Admitting: Emergency Medicine

## 2020-08-16 DIAGNOSIS — J029 Acute pharyngitis, unspecified: Secondary | ICD-10-CM | POA: Insufficient documentation

## 2020-08-16 DIAGNOSIS — J069 Acute upper respiratory infection, unspecified: Secondary | ICD-10-CM | POA: Insufficient documentation

## 2020-08-16 LAB — POCT RAPID STREP A (OFFICE): Rapid Strep A Screen: NEGATIVE

## 2020-08-16 NOTE — ED Triage Notes (Signed)
Patient presents to Urgent Care with complaints of nasal congestion and sore throat x 2 days. Hx of allergies and sinus infections. Takes Singulair and claratin med's.   Denies fever, n/v, or diarrhea.

## 2020-08-16 NOTE — ED Provider Notes (Signed)
Roderic Palau    CSN: [id number] Arrival date & time: 08/16/20  1002      History   Chief Complaint Chief Complaint  Patient presents with  . Nasal Congestion  . Sore Throat    HPI Jillian Edwards is a 55 y.o. female.   Patient presents with 2-day history of nasal congestion, postnasal drip, cough, and sore throat.  She states her sore throat is her worst symptom and feels like she is "swallowing razor blades."  She denies fever, rash, shortness of breath, vomiting, diarrhea, or other symptoms.  Treatment attempted at home with her usual daily routine of Claritin and Singulair; no additional treatment attempted.  Her medical history includes hypertension, stroke, ADD, depression, anxiety, headaches, insomnia.  The history is provided by the patient and medical records.    Past Medical History:  Diagnosis Date  . ADD (attention deficit disorder)   . Anxiety   . Depression   . Dysplastic nevus 11/07/2017   left lat epigastric, right mid post thigh, right lat buttocks, left sup med buttocks  . Dysplastic nevus 10/14/2018   right sup lat buttock  . Dysplastic nevus 04/24/2019   low back spinal, right low back 7.0 cm lat to spine  . Frequent urination   . Headache   . Herpes genitalis   . Hypertension   . Stress headaches   . Stroke ([place]) ~2005    loss of peripheral vision  . Stroke or transient ischemic attack (TIA) diagnosed during current admission   . Urological disorder    bladder has dropped    Patient Active Problem List   Diagnosis Date Noted  . Encounter for general adult medical examination with abnormal findings 02/03/2020  . Encounter for long-term (current) use of high-risk medication 02/03/2020  . Encounter for screening mammogram for malignant neoplasm of breast 02/03/2020  . Dysuria 02/03/2020  . Hypercalcemia 11/16/2019  . Acute otitis externa of left ear 04/26/2019  . Encounter for long-term (current) use of medications 10/22/2018  .  Primary insomnia 10/22/2018  . Migraine 07/23/2018  . Chronic pansinusitis 06/17/2018  . New daily persistent headache 06/03/2018  . History of stroke 06/03/2018  . Other fatigue 01/26/2018  . Unspecified menopausal and perimenopausal disorder 01/26/2018  . Acute recurrent pansinusitis 08/02/2017  . Edema of both feet 08/02/2017  . Attention and concentration deficit 05/04/2017  . GAD (generalized anxiety disorder) 05/04/2017  . Stroke ([place])   . Essential hypertension   . Herpes genitalis   . Depression   . Anxiety   . ADD (attention deficit disorder)   . Incontinence 08/01/2015  . Microscopic hematuria 08/01/2015  . Urinary frequency 08/01/2015  . Cystocele, grade 2 08/01/2015    Past Surgical History:  Procedure Laterality Date  . BREAST BIOPSY Left 02/06/2013   stereotactic biopsy of calcifications-benign  . BREAST ENHANCEMENT SURGERY  08/2003  . COLONOSCOPY WITH PROPOFOL N/A 12/14/2016   Procedure: COLONOSCOPY WITH PROPOFOL;  Surgeon: Jonathon Bellows, MD;  Location: [facility] ENDOSCOPY;  Service: Gastroenterology;  Laterality: N/A;  . COLPOSCOPY  03/1998; 07/1999   2000-no lesion; 2001-CIN1  . ENDOMETRIAL BIOPSY  03/2007   proliferative    OB History    Gravida  1   Para  1   Term  1   Preterm      AB      Living  1     SAB      IAB      Ectopic  Multiple      Live Births  1            Home Medications    Prior to Admission medications   Medication Sig Start Date End Date Taking? Authorizing Provider  ALPRAZolam Duanne Moron) 0.5 MG tablet Take 1 tablet (0.5 mg total) by mouth 2 (two) times daily as needed for anxiety. 10/21/19   Ronnell Freshwater, NP  amphetamine-dextroamphetamine (ADDERALL) 20 MG tablet Take 1 tablet (20 mg total) by mouth 2 (two) times daily. 07/12/20   Luiz Ochoa, NP  amphetamine-dextroamphetamine (ADDERALL) 20 MG tablet Take 1 tablet (20 mg total) by mouth 2 (two) times daily. 07/12/20   Luiz Ochoa, NP  aspirin EC 81 MG  tablet Take 81 mg by mouth daily.    [provider]  azelastine (ASTELIN) 0.1 % nasal spray Place 1 spray into both nostrils 2 (two) times daily. Use in each nostril as directed 06/04/20   Luiz Ochoa, NP  cholecalciferol (VITAMIN D) 1000 units tablet Take 2,000 Units by mouth daily.     [provider]  clotrimazole-betamethasone (LOTRISONE) cream Apply 1 application topically 2 (two) times daily. 10/06/19   Ronnell Freshwater, NP  escitalopram (LEXAPRO) 10 MG tablet Take 1 tablet (10 mg total) by mouth daily. 07/12/20   Luiz Ochoa, NP  hydrochlorothiazide (HYDRODIURIL) 25 MG tablet Take 1 tablet (25 mg total) by mouth daily. Take 1 tablet QAM and 1/2 tablet QPM as needed 01/19/20   Ronnell Freshwater, NP  Ivermectin 1 % CREA  03/24/18   [provider]  mometasone (ELOCON) 0.1 % cream Apply 1 application topically as needed. 04/24/19   [provider]  mometasone (ELOCON) 0.1 % cream Apply 1 application topically daily as needed (Rash). 3 days per week prn to elbows and brow area 06/17/20   Ralene Bathe, MD  montelukast (SINGULAIR) 10 MG tablet Take 1 tablet (10 mg total) by mouth at bedtime. 06/04/20   Luiz Ochoa, NP  montelukast (SINGULAIR) 10 MG tablet Take 1 tablet (10 mg total) by mouth at bedtime. 07/12/20   Luiz Ochoa, NP  propranolol ER (INDERAL LA) 60 MG 24 hr capsule Take 1 capsule (60 mg total) by mouth daily. 06/04/20   Luiz Ochoa, NP  SUMAtriptan (IMITREX) 50 MG tablet TAKE 1 TAB EVERY 2 HOURS AS NEEDED FOR MIGRAINE. MAY REPEAT IN 2 HRS IF HEADACHE PERSISTS OR RECURS 05/18/20   Luiz Ochoa, NP  tacrolimus (PROTOPIC) 0.1 % ointment APPLY TOPICALLY IN THE MORNING AND AT BEDTIME. 12/15/19   Ralene Bathe, MD  Ubrogepant (UBRELVY) 100 MG TABS Take 1 tablet by mouth 2 (two) times daily as needed. Dx: O13.086, G44.52 07/07/20   Luiz Ochoa, NP  vitamin E 180 MG (400 UNITS) capsule Take 1 capsule by mouth daily. 03/19/19    [provider]    Family History Family History  Problem Relation Age of Onset  . Hypertension Father   . Diabetes Father        Has also had an amputation  . Heart attack Father   . Diabetes Mellitus II Father   . Diabetes Mellitus II Brother   . Hypertension Brother   . Breast cancer Maternal Grandmother 60  . Diabetes Mellitus II Paternal Grandmother     Social History Social History   Tobacco Use  . Smoking status: Never Smoker  . Smokeless tobacco: Never Used  Vaping Use  . Vaping  Use: Never used  Substance Use Topics  . Alcohol use: No    Alcohol/week: 0.0 standard drinks  . Drug use: No     Allergies   Patient has no known allergies.   Review of Systems Review of Systems  Constitutional: Negative for chills and fever.  HENT: Positive for congestion, postnasal drip and sore throat. Negative for ear pain.   Respiratory: Positive for cough. Negative for shortness of breath.   Cardiovascular: Negative for chest pain and palpitations.  Gastrointestinal: Negative for abdominal pain, diarrhea and vomiting.  Skin: Negative for color change and rash.  All other systems reviewed and are negative.    Physical Exam Triage Vital Signs ED Triage Vitals  Enc Vitals Group     BP      Pulse      Resp      Temp      Temp src      SpO2      Weight      Height      Head Circumference      Peak Flow      Pain Score      Pain Loc      Pain Edu?      Excl. in [place]?    No data found.  Updated Vital Signs BP 138/90 (BP Location: Left Arm)   Pulse 77   Temp 97.7 F (36.5 C) (Oral)   Resp 16   SpO2 98%   Visual Acuity Right Eye Distance:   Left Eye Distance:   Bilateral Distance:    Right Eye Near:   Left Eye Near:    Bilateral Near:     Physical Exam Vitals and nursing note reviewed.  Constitutional:      General: She is not in acute distress.    Appearance: She is well-developed.  HENT:     Head: Normocephalic and atraumatic.      Right Ear: Tympanic membrane normal.     Left Ear: Tympanic membrane normal.     Nose: Nose normal.     Mouth/Throat:     Mouth: Mucous membranes are moist.     Pharynx: Posterior oropharyngeal erythema present.  Eyes:     Conjunctiva/sclera: Conjunctivae normal.  Cardiovascular:     Rate and Rhythm: Normal rate and regular rhythm.     Heart sounds: Normal heart sounds.  Pulmonary:     Effort: Pulmonary effort is normal. No respiratory distress.     Breath sounds: Normal breath sounds.  Abdominal:     Palpations: Abdomen is soft.     Tenderness: There is no abdominal tenderness.  Musculoskeletal:     Cervical back: Neck supple.  Skin:    General: Skin is warm and dry.  Neurological:     General: No focal deficit present.     Mental Status: She is alert and oriented to person, place, and time.     Gait: Gait normal.  Psychiatric:        Mood and Affect: Mood normal.        Behavior: Behavior normal.      UC Treatments / Results  Labs (all labs ordered are listed, but only abnormal results are displayed) Labs Reviewed  CULTURE, GROUP A STREP [facility])  POCT RAPID STREP A (OFFICE)    EKG   Radiology No results found.  Procedures Procedures (including critical care time)  Medications Ordered in UC Medications - No data to display  Initial Impression / Assessment and Plan /  UC Course  I have reviewed the triage vital signs and the nursing notes.  Pertinent labs & imaging results that were available during my care of the patient were reviewed by me and considered in my medical decision making (see chart for details).   Sore throat, Viral URI.  Patient declines COVID test.  Rapid strep negative; culture pending.  Discussed symptomatic treatment with Tylenol or ibuprofen and Mucinex.  Instructed her to follow-up with her PCP if her symptoms are not improving.  She agrees to plan of care.   Final Clinical Impressions(s) / UC Diagnoses   Final diagnoses:  Viral URI   Sore throat     Discharge Instructions     Your rapid strep test is negative.  A throat culture is pending; we will call you if it is positive requiring treatment.    Take Tylenol or ibuprofen as needed for discomfort.  Take plain over-the-counter Mucinex as needed for congestion.    Follow up with your primary care provider if your symptoms are not improving.        ED Prescriptions    None     PDMP not reviewed this encounter.   Sharion Balloon, NP 08/16/20 1125

## 2020-08-16 NOTE — Discharge Instructions (Addendum)
Your rapid strep test is negative.  A throat culture is pending; we will call you if it is positive requiring treatment.    Take Tylenol or ibuprofen as needed for discomfort.  Take plain over-the-counter Mucinex as needed for congestion.    Follow up with your primary care provider if your symptoms are not improving.

## 2020-08-17 ENCOUNTER — Encounter: Payer: Self-pay | Admitting: Internal Medicine

## 2020-08-17 ENCOUNTER — Ambulatory Visit: Payer: BLUE CROSS/BLUE SHIELD | Admitting: Internal Medicine

## 2020-08-17 DIAGNOSIS — R059 Cough, unspecified: Secondary | ICD-10-CM

## 2020-08-17 DIAGNOSIS — J029 Acute pharyngitis, unspecified: Secondary | ICD-10-CM

## 2020-08-17 DIAGNOSIS — J309 Allergic rhinitis, unspecified: Secondary | ICD-10-CM

## 2020-08-17 DIAGNOSIS — Z1152 Encounter for screening for COVID-19: Secondary | ICD-10-CM | POA: Diagnosis not present

## 2020-08-17 DIAGNOSIS — H1010 Acute atopic conjunctivitis, unspecified eye: Secondary | ICD-10-CM

## 2020-08-17 DIAGNOSIS — R6889 Other general symptoms and signs: Secondary | ICD-10-CM | POA: Diagnosis not present

## 2020-08-17 DIAGNOSIS — J324 Chronic pansinusitis: Secondary | ICD-10-CM

## 2020-08-17 LAB — POCT INFLUENZA A/B
Influenza A, POC: NEGATIVE
Influenza B, POC: NEGATIVE

## 2020-08-17 LAB — POCT RAPID STREP A (OFFICE): Rapid Strep A Screen: NEGATIVE

## 2020-08-17 LAB — POC SOFIA SARS ANTIGEN FIA: SARS Coronavirus 2 Ag: NEGATIVE

## 2020-08-17 MED ORDER — DOXYCYCLINE HYCLATE 100 MG PO TABS
100.0000 mg | ORAL_TABLET | Freq: Two times a day (BID) | ORAL | 0 refills | Status: DC
Start: 2020-08-17 — End: 2020-11-15

## 2020-08-17 NOTE — Progress Notes (Signed)
[facility] [place], [place] [address]  Internal MEDICINE  Office Visit Note  Patient Name: Jillian Edwards  [id number]  [id number]  Date of Service: 08/17/2020  Chief Complaint  Patient presents with  . Cough    Symptoms started Friday 08/16/20.  No fever, no HA, no body-aches, has been using mucinex, Singulair and Claritin  . sinus drainage    Going down throat, makes throat sore.  Went to urgent care and gave nothing to her  . left ear fluid    Feels weird like water is in ear and pops.    HPI Hal Hope is here for acute and sick visit. She started to feel sick on 23 May went to urgent care with sore throat drainage she did have swab done for strep which was negative in the urgent care She does have sinus congestion with greenish mucousy discharge with severe sore throat however she feels like since this morning her throat feels a little better patient also feels very like water in her ears and it did pop all the time. Patient does suffer from allergies year-long she gets sick at least 3-4 times/ year She does feel tired and sluggish whenever she is sick. She has never been tested for allergies. She never had CT of the sinuses, she has seen ENT in the past and had a laryngoscopy done however according to her that everything was fine.  She denies any chest pain shortness of breath fever chills she has been taking Mucinex over-the-counter without any relief in her symptoms. She does worry about stroke ( h/o)while taking those over-the-counter medications and does not feel comfortable taking them  Current Medication: Outpatient Encounter Medications as of 08/17/2020  Medication Sig  . doxycycline (VIBRA-TABS) 100 MG tablet Take 1 tablet (100 mg total) by mouth 2 (two) times daily.  Marland Kitchen ALPRAZolam (XANAX) 0.5 MG tablet Take 1 tablet (0.5 mg total) by mouth 2 (two) times daily as needed for anxiety.  Marland Kitchen amphetamine-dextroamphetamine (ADDERALL) 20 MG tablet Take 1  tablet (20 mg total) by mouth 2 (two) times daily.  Marland Kitchen amphetamine-dextroamphetamine (ADDERALL) 20 MG tablet Take 1 tablet (20 mg total) by mouth 2 (two) times daily.  Marland Kitchen aspirin EC 81 MG tablet Take 81 mg by mouth daily.  Marland Kitchen azelastine (ASTELIN) 0.1 % nasal spray Place 1 spray into both nostrils 2 (two) times daily. Use in each nostril as directed  . cholecalciferol (VITAMIN D) 1000 units tablet Take 2,000 Units by mouth daily.   . clotrimazole-betamethasone (LOTRISONE) cream Apply 1 application topically 2 (two) times daily.  Marland Kitchen escitalopram (LEXAPRO) 10 MG tablet Take 1 tablet (10 mg total) by mouth daily.  . hydrochlorothiazide (HYDRODIURIL) 25 MG tablet Take 1 tablet (25 mg total) by mouth daily. Take 1 tablet QAM and 1/2 tablet QPM as needed  . Ivermectin 1 % CREA   . mometasone (ELOCON) 0.1 % cream Apply 1 application topically as needed.  . mometasone (ELOCON) 0.1 % cream Apply 1 application topically daily as needed (Rash). 3 days per week prn to elbows and brow area  . montelukast (SINGULAIR) 10 MG tablet Take 1 tablet (10 mg total) by mouth at bedtime.  . montelukast (SINGULAIR) 10 MG tablet Take 1 tablet (10 mg total) by mouth at bedtime.  . propranolol ER (INDERAL LA) 60 MG 24 hr capsule Take 1 capsule (60 mg total) by mouth daily.  . SUMAtriptan (IMITREX) 50 MG tablet TAKE 1 TAB EVERY 2 HOURS AS NEEDED  FOR MIGRAINE. MAY REPEAT IN 2 HRS IF HEADACHE PERSISTS OR RECURS  . tacrolimus (PROTOPIC) 0.1 % ointment APPLY TOPICALLY IN THE MORNING AND AT BEDTIME.  Marland Kitchen Ubrogepant (UBRELVY) 100 MG TABS Take 1 tablet by mouth 2 (two) times daily as needed. Dx: H67.591, G44.52  . vitamin E 180 MG (400 UNITS) capsule Take 1 capsule by mouth daily.   No facility-administered encounter medications on file as of 08/17/2020.    Surgical History: Past Surgical History:  Procedure Laterality Date  . BREAST BIOPSY Left 02/06/2013   stereotactic biopsy of calcifications-benign  . BREAST ENHANCEMENT SURGERY   08/2003  . COLONOSCOPY WITH PROPOFOL N/A 12/14/2016   Procedure: COLONOSCOPY WITH PROPOFOL;  Surgeon: Jonathon Bellows, MD;  Location: [facility] ENDOSCOPY;  Service: Gastroenterology;  Laterality: N/A;  . COLPOSCOPY  03/1998; 07/1999   2000-no lesion; 2001-CIN1  . ENDOMETRIAL BIOPSY  03/2007   proliferative    Medical History: Past Medical History:  Diagnosis Date  . ADD (attention deficit disorder)   . Anxiety   . Depression   . Dysplastic nevus 11/07/2017   left lat epigastric, right mid post thigh, right lat buttocks, left sup med buttocks  . Dysplastic nevus 10/14/2018   right sup lat buttock  . Dysplastic nevus 04/24/2019   low back spinal, right low back 7.0 cm lat to spine  . Frequent urination   . Headache   . Herpes genitalis   . Hypertension   . Stress headaches   . Stroke ([place]) ~2005    loss of peripheral vision  . Stroke or transient ischemic attack (TIA) diagnosed during current admission   . Urological disorder    bladder has dropped    Family History: Family History  Problem Relation Age of Onset  . Hypertension Father   . Diabetes Father        Has also had an amputation  . Heart attack Father   . Diabetes Mellitus II Father   . Diabetes Mellitus II Brother   . Hypertension Brother   . Breast cancer Maternal Grandmother 60  . Diabetes Mellitus II Paternal Grandmother     Social History   Socioeconomic History  . Marital status: Legally Separated    Spouse name: Not on file  . Number of children: 1  . Years of education: 63  . Highest education level: Not on file  Occupational History  . Occupation: Corporate treasurer  Tobacco Use  . Smoking status: Never Smoker  . Smokeless tobacco: Never Used  Vaping Use  . Vaping Use: Never used  Substance and Sexual Activity  . Alcohol use: No    Alcohol/week: 0.0 standard drinks  . Drug use: No  . Sexual activity: Yes    Birth control/protection: I.U.D.    Comment: Mirena  Other Topics Concern  . Not  on file  Social History Narrative  . Not on file   Social Determinants of Health   Financial Resource Strain: Not on file  Food Insecurity: Not on file  Transportation Needs: Not on file  Physical Activity: Not on file  Stress: Not on file  Social Connections: Not on file  Intimate Partner Violence: Not on file      Review of Systems  Constitutional: Negative for chills, diaphoresis, fatigue and fever.  HENT: Positive for sinus pain and sore throat. Negative for congestion, ear pain, mouth sores, postnasal drip, rhinorrhea and sinus pressure.   Respiratory: Positive for cough. Negative for wheezing.   Cardiovascular: Negative for chest pain, palpitations  and leg swelling.  Gastrointestinal: Negative for vomiting.  Genitourinary: Negative for dysuria and flank pain.  Musculoskeletal: Negative for neck pain.  Skin: Negative for color change.  Allergic/Immunologic: Negative for environmental allergies and food allergies.  Neurological: Positive for dizziness. Negative for headaches.  Psychiatric/Behavioral: Negative.  Negative for agitation, behavioral problems (depression) and hallucinations.    Vital Signs: BP 138/90   Pulse 67   Temp 98.6 F (37 C)   Resp 16   Ht 5\' 6"  (1.676 m)   Wt 147 lb (66.7 kg)   SpO2 99%   BMI 23.73 kg/m    Physical Exam Constitutional:      Appearance: She is ill-appearing.  HENT:     Right Ear: Tympanic membrane normal.     Left Ear: Tympanic membrane normal.     Nose: Congestion present.     Mouth/Throat:     Mouth: Mucous membranes are dry.     Pharynx: Posterior oropharyngeal erythema present.  Eyes:     Extraocular Movements: Extraocular movements intact.     Pupils: Pupils are equal, round, and reactive to light.  Cardiovascular:     Rate and Rhythm: Normal rate.        Assessment/Plan: 1. Chronic pansinusitis Patient has been having long-term symptoms sounds to be chronic sinusitis, for proper treatment patient needs  further testing and diagnostics.  We will order allergy testing and a CT of sinuses - Allergy Test - doxycycline (VIBRA-TABS) 100 MG tablet; Take 1 tablet (100 mg total) by mouth 2 (two) times daily.  Dispense: 30 tablet; Refill: 0 - CT MAXILLOFACIAL WO CONTRAST; Future  2. Encounter for screening for COVID-19 This test is performed here in the office and it is negative - POC SOFIA Antigen FIA  3. Sore throat Patient does have some erythema on exam however her strep and influenza both are negative.  Patient instructed to use salt lukewarm water for gargles and keep an eye on her symptoms - POCT rapid strep A - POCT Influenza A/B  4. Allergic rhinoconjunctivitis Ongoing chronic symptoms of allergy and rhinitis along with conjunctivitis.  Previous visit patient did have add on therapy with Singulair and Flonase.  Most definitely patient does need allergy testing and CT sinuses for diagnosis of chronic sinusitis patient understands and agrees with the treatment plan - Allergy Test  General Counseling: Evalynne verbalizes understanding of the findings of todays visit and agrees with plan of treatment. I have discussed any further diagnostic evaluation that may be needed or ordered today. We also reviewed her medications today. she has been encouraged to call the office with any questions or concerns that should arise related to todays visit.    Orders Placed This Encounter  Procedures  . Allergy Test  . CT MAXILLOFACIAL WO CONTRAST  . POCT rapid strep A  . POCT Influenza A/B  . POC SOFIA Antigen FIA    Meds ordered this encounter  Medications  . doxycycline (VIBRA-TABS) 100 MG tablet    Sig: Take 1 tablet (100 mg total) by mouth 2 (two) times daily.    Dispense:  30 tablet    Refill:  0    Total time spent: 30Minutes Time spent includes review of chart, medications, test results, and follow up plan with the patient.   [place] Controlled Substance Database was reviewed by me.   Dr  Lavera Guise Internal medicine

## 2020-08-18 LAB — CULTURE, GROUP A STREP (THRC)

## 2020-08-30 ENCOUNTER — Ambulatory Visit: Payer: BLUE CROSS/BLUE SHIELD | Attending: Internal Medicine

## 2020-09-10 ENCOUNTER — Ambulatory Visit: Payer: BLUE CROSS/BLUE SHIELD | Admitting: Nurse Practitioner

## 2020-09-15 ENCOUNTER — Telehealth: Payer: Self-pay

## 2020-09-15 NOTE — Telephone Encounter (Signed)
Left vm for patient to return call to reschedule 09/10/20 missed appointment-Toni

## 2020-10-04 ENCOUNTER — Telehealth: Payer: Self-pay

## 2020-10-04 NOTE — Telephone Encounter (Signed)
Left vm to reschedule 09/10/20 missed appointment-Toni

## 2020-11-06 ENCOUNTER — Other Ambulatory Visit: Payer: Self-pay | Admitting: Nurse Practitioner

## 2020-11-06 DIAGNOSIS — I10 Essential (primary) hypertension: Secondary | ICD-10-CM

## 2020-11-15 ENCOUNTER — Other Ambulatory Visit: Payer: Self-pay

## 2020-11-15 ENCOUNTER — Encounter: Payer: Self-pay | Admitting: Nurse Practitioner

## 2020-11-15 ENCOUNTER — Ambulatory Visit: Payer: BLUE CROSS/BLUE SHIELD | Admitting: Nurse Practitioner

## 2020-11-15 VITALS — BP 132/85 | HR 60 | Temp 97.8°F | Ht 66.0 in | Wt 155.2 lb

## 2020-11-15 DIAGNOSIS — F411 Generalized anxiety disorder: Secondary | ICD-10-CM | POA: Diagnosis not present

## 2020-11-15 DIAGNOSIS — I10 Essential (primary) hypertension: Secondary | ICD-10-CM | POA: Diagnosis not present

## 2020-11-15 DIAGNOSIS — R519 Headache, unspecified: Secondary | ICD-10-CM

## 2020-11-15 DIAGNOSIS — Z7689 Persons encountering health services in other specified circumstances: Secondary | ICD-10-CM | POA: Diagnosis not present

## 2020-11-15 DIAGNOSIS — J309 Allergic rhinitis, unspecified: Secondary | ICD-10-CM | POA: Diagnosis not present

## 2020-11-15 DIAGNOSIS — R41840 Attention and concentration deficit: Secondary | ICD-10-CM

## 2020-11-15 MED ORDER — MONTELUKAST SODIUM 10 MG PO TABS: 10.0000 mg | ORAL_TABLET | Freq: Every day | ORAL | 1 refills | Status: DC

## 2020-11-15 MED ORDER — UBRELVY 100 MG PO TABS: 1.0000 | ORAL_TABLET | Freq: Two times a day (BID) | ORAL | 3 refills | Status: DC | PRN

## 2020-11-15 MED ORDER — AMPHETAMINE-DEXTROAMPHETAMINE 30 MG PO TABS: 30.0000 mg | ORAL_TABLET | Freq: Two times a day (BID) | ORAL | 0 refills | Status: DC

## 2020-11-15 MED ORDER — ESCITALOPRAM OXALATE 10 MG PO TABS: 10.0000 mg | ORAL_TABLET | Freq: Every day | ORAL | 1 refills | Status: DC

## 2020-11-15 MED ORDER — PROPRANOLOL HCL ER 60 MG PO CP24: 60.0000 mg | ORAL_CAPSULE | Freq: Every day | ORAL | 1 refills | Status: DC

## 2020-11-15 NOTE — Patient Instructions (Signed)
Attention Deficit Hyperactivity Disorder, Adult Attention deficit hyperactivity disorder (ADHD) is a mental health disorder that starts during childhood (neurodevelopmental disorder). For many people with ADHD, the disorder continues into the adult years.Treatment can help you manage your symptoms. What are the causes? The exact cause of ADHD is not known. Most experts believe genetics andenvironmental factors contribute to ADHD. What increases the risk? The following factors may make you more likely to develop this condition: Having a family history of ADHD. Being female. Being born to a mother who smoked or drank alcohol during pregnancy. Being exposed to lead or other toxins in the womb or early in life. Being born before 37 weeks of pregnancy (prematurely) or at a low birth weight. Having experienced a brain injury. What are the signs or symptoms? Symptoms of this condition depend on the type of ADHD. The two main types are inattentive and hyperactive-impulsive. Some people may have symptoms of bothtypes. Symptoms of the inattentive type include: Difficulty paying attention. Making careless mistakes. Not following instructions. Being disorganized. Avoiding tasks that require time and attention. Losing and forgetting things. Being easily distracted. Symptoms of the hyperactive-impulsive type include: Restlessness. Talking too much. Interrupting. Difficulty with: Sitting still. Feeling motivated. Relaxing. Waiting in line or waiting for a turn. In adults, this condition may lead to certain problems, such as: Keeping jobs. Performing tasks at work. Having stable relationships. Being on time or keeping to a schedule. How is this diagnosed? This condition is diagnosed based on your current symptoms and your history of symptoms. The diagnosis can be made by a health care provider such as a primarycare provider or a mental health care specialist. Your health care provider may use a  symptom checklist or a behavior rating scale to evaluate your symptoms. He or she may also want to talk with peoplewho have observed your behaviors throughout your life. How is this treated? This condition can be treated with medicines and behavior therapy. Medicines may be the best option to reduce impulsive behaviors and improve attention. Your health care provider may recommend: Stimulant medicines. These are the most common medicines used for adult ADHD. They affect certain chemicals in the brain (neurotransmitters) and improve your ability to control your symptoms. A non-stimulant medicine for adult ADHD (atomoxetine). This medicine increases a neurotransmitter called norepinephrine. It may take weeks to months to see effects from this medicine. Counseling and behavioral management are also important for treating ADHD. Counseling is often used along with medicine. Your health care provider may suggest: Cognitive behavioral therapy (CBT). This type of therapy teaches you to replace negative thoughts and actions with positive thoughts and actions. When used as part of ADHD treatment, this therapy may also include: Coping strategies for organization, time management, impulse control, and stress reduction. Mindfulness and meditation training. Behavioral management. You may work with a coach who is specially trained to help people with ADHD manage and organize activities and function more effectively. Follow these instructions at home: Medicines  Take over-the-counter and prescription medicines only as told by your health care provider. Talk with your health care provider about the possible side effects of your medicines and how to manage them.  Lifestyle  Do not use drugs. Do not drink alcohol if: Your health care provider tells you not to drink. You are pregnant, may be pregnant, or are planning to become pregnant. If you drink alcohol: Limit how much you use to: 0-1 drink a day for  women. 0-2 drinks a day for men. Be aware   of how much alcohol is in your drink. In the [place] one drink equals one 12 oz bottle of beer (355 mL), one 5 oz glass of wine (148 mL), or one 1 oz glass of hard liquor (44 mL). Get enough sleep. Eat a healthy diet. Exercise regularly. Exercise can help to reduce stress and anxiety.  General instructions Learn as much as you can about adult ADHD, and work closely with your health care providers to find the treatments that work best for you. Follow the same schedule each day. Use reminder devices like notes, calendars, and phone apps to stay on time and organized. Keep all follow-up visits as told by your health care provider and therapist. This is important. Where to find more information A health care provider may be able to recommend resources that are available online or over the phone. You could start with: Attention Deficit Disorder Association (ADDA): www.add.org National Institute of Mental Health (NIMH): www.nimh.nih.gov Contact a health care provider if: Your symptoms continue to cause problems. You have side effects from your medicine, such as: Repeated muscle twitches, coughing, or speech outbursts. Sleep problems. Loss of appetite. Dizziness. Unusually fast heartbeat. Stomach pains. Headaches. You are struggling with anxiety, depression, or substance abuse. Get help right away if you: Have a severe reaction to a medicine. If you ever feel like you may hurt yourself or others, or have thoughts about taking your own life, get help right away. You can go to the nearest emergency department or call: Your local emergency services (911 in the [place] A suicide crisis helpline, such as the National Suicide Prevention Lifeline at 1-800-273-8255. This is open 24 hours a day. Summary ADHD is a mental health disorder that starts during childhood (neurodevelopmental disorder) and often continues into the adult years. The exact cause of ADHD  is not known. Most experts believe genetics and environmental factors contribute to ADHD. There is no cure for ADHD, but treatment with medicine, cognitive behavioral therapy, or behavioral management can help you manage your condition. This information is not intended to replace advice given to you by your health care provider. Make sure you discuss any questions you have with your healthcare provider. Document Revised: 08/05/2018 Document Reviewed: 08/05/2018 Elsevier Patient Education  2022 [employer]  

## 2020-11-15 NOTE — Progress Notes (Signed)
New Patient Office Visit  Subjective:  Patient ID: Jillian Edwards, female    DOB: 1965-10-03  Age: 55 y.o. MRN: [id number]  CC:  Chief Complaint  Patient presents with   New Patient (Initial Visit)    HPI Jillian Edwards presents to establish new primary care provider.  She is coming from previous primary care provider who is trying to make changes with medications and trying to have her see ENT due to chronic allergic rhinitis and sinusitis.  Patient was not agreeable to this plan.  She does continue to have allergic rhinitis.  Currently managing symptoms well with taking Claritin OTC and nasal sprays.  Has taken Singulair in the past.  Has not had any prescription for this in some time.  Presenting some itchiness of both ears and increased nasal congestion since running out of of Singulair. Pressure well managed.  Continues to take Inderal and hydrochlorothiazide daily.  She states that she is going to the gym more frequently.  She is sleeping better.  She is taking melatonin 3 mg when needed.  States she has not needed to take alprazolam in some time.  She states that recent visit was prior PCP, Wellbutrin was changed to Lexapro.  Patient states she has not noted much of a difference.  With reports of improved sleep and less panic attacks, I believe this was a good change.  She is tolerating well with no negative side effects.  Managing headaches well.  Does take Ubrelvy when needed for acute headache.  She needs a refill for this today. Patient does have history of adult attention deficit disorder.  She did take ASRS 1.1 during today's visit.  She does score 6 out of 6 on part A.  This showed a strong indication for adult attention deficit disorder.  Part B further service this diagnosis.  She has tried several different medications and doses of medications to help control ADHD.  Of the different regimens, Adderall 30 mg twice daily when needed has worked the best.  It helps to keep her  focused and on track, finishing work on time, without negative side effects.  Her dose was decreased to 20 mg twice daily with prior PCP.  She finds that this does wear off too quickly and she is not able to focus as well on the tasks that head.  She states that family members have noticed the change, and are not happy.  A review of her PDMP profile was done today.  She has an overdose risk score of 110.  Fill of Adderall was 08/26/2020 for 20 mg tablets.  Adult ADHD Self Report Scale (most recent)     Adult ADHD Self-Report Scale (ASRS-v1.1) Symptom Checklist - 11/15/20 1124       Part A   1. How often do you have trouble wrapping up the final details of a project, once the challenging parts have been done? Very Often  2. How often do you have difficulty getting things done in order when you have to do a task that requires organization? Sometimes    3. How often do you have problems remembering appointments or obligations? Very Often  4. When you have a task that requires a lot of thought, how often do you avoid or delay getting started? Often    5. How often do you fidget or squirm with your hands or feet when you have to sit down for a long time? Very Often  6. How often do you  feel overly active and compelled to do things, like you were driven by a motor? Often      Part B   7. How often do you make careless mistakes when you have to work on a boring or difficult project? Often  8. How often do you have difficulty keeping your attention when you are doing boring or repetitive work? Very Often    9. How often do you have difficulty concentrating on what people say to you, even when they are speaking to you directly? Very Often  10. How often do you misplace or have difficulty finding things at home or at work? Often    11. How often are you distracted by activity or noise around you? Often  12. How often do you leave your seat in meetings or other situations in which you are expected to remain  seated? Rarely    13. How often do you feel restless or fidgety? Very Often  23. How often do you have difficulty unwinding and relaxing when you have time to yourself? Sometimes    15. How often do you find yourself talking too much when you are in social situations? Sometimes  16. When you are in a conversation, how often do you find yourself finishing the sentences of the people you are talking to, before they can finish them themselves? Sometimes    17. How often do you have difficulty waiting your turn in situations when turn taking is required? Very Often  60. How often do you interrupt others when they are busy? Very Often           Patient reports no further recurrent symptoms or complaints today.  She denies chest pain, chest pressure, or shortness of breath. She denies headaches or visual disturbances. She denies abdominal pain, nausea, vomiting, or changes in bowel or bladder habits.     Past Medical History:  Diagnosis Date   ADD (attention deficit disorder)    Anxiety    Depression    Dysplastic nevus 11/07/2017   left lat epigastric, right mid post thigh, right lat buttocks, left sup med buttocks   Dysplastic nevus 10/14/2018   right sup lat buttock   Dysplastic nevus 04/24/2019   low back spinal, right low back 7.0 cm lat to spine   Frequent urination    Headache    Herpes genitalis    Hypertension    Stress headaches    Stroke ([place]) ~2005    loss of peripheral vision   Stroke or transient ischemic attack (TIA) diagnosed during current admission    Urological disorder    bladder has dropped    Past Surgical History:  Procedure Laterality Date   BREAST BIOPSY Left 02/06/2013   stereotactic biopsy of calcifications-benign   BREAST ENHANCEMENT SURGERY  08/2003   COLONOSCOPY WITH PROPOFOL N/A 12/14/2016   Procedure: COLONOSCOPY WITH PROPOFOL;  Surgeon: Jonathon Bellows, MD;  Location: [facility] ENDOSCOPY;  Service: Gastroenterology;  Laterality: N/A;   COLPOSCOPY  03/1998;  07/1999   2000-no lesion; 2001-CIN1   ENDOMETRIAL BIOPSY  03/2007   proliferative    Family History  Problem Relation Age of Onset   Hypertension Father    Diabetes Father        Has also had an amputation   Heart attack Father    Diabetes Mellitus II Father    Diabetes Mellitus II Brother    Hypertension Brother    Breast cancer Maternal Grandmother 60   Diabetes Mellitus II Paternal  Grandmother     Social History   Socioeconomic History   Marital status: Legally Separated    Spouse name: Not on file   Number of children: 1   Years of education: 12   Highest education level: Not on file  Occupational History   Occupation: Corporate treasurer  Tobacco Use   Smoking status: Never   Smokeless tobacco: Never  Vaping Use   Vaping Use: Never used  Substance and Sexual Activity   Alcohol use: No    Alcohol/week: 0.0 standard drinks   Drug use: No   Sexual activity: Yes    Birth control/protection: I.U.D.    Comment: Mirena  Other Topics Concern   Not on file  Social History Narrative   Not on file   Social Determinants of Health   Financial Resource Strain: Not on file  Food Insecurity: Not on file  Transportation Needs: Not on file  Physical Activity: Not on file  Stress: Not on file  Social Connections: Not on file  Intimate Partner Violence: Not on file    ROS Review of Systems  Constitutional:  Negative for activity change, chills, fatigue and fever.  HENT:  Positive for congestion and postnasal drip. Negative for sinus pressure, sinus pain and sore throat.   Eyes: Negative.   Respiratory:  Negative for shortness of breath.   Cardiovascular:  Positive for leg swelling. Negative for chest pain and palpitations.       Did notice some mild ankle and pedal edema when not taking hydrochlorothiazide.  Gastrointestinal:  Negative for constipation, diarrhea, nausea and vomiting.  Endocrine: Negative for cold intolerance, heat intolerance, polydipsia and  polyuria.  Musculoskeletal: Negative.  Negative for back pain and myalgias.  Skin:  Negative for rash.  Allergic/Immunologic: Positive for environmental allergies.  Neurological:  Positive for headaches. Negative for dizziness and weakness.  Hematological: Negative.   Psychiatric/Behavioral:  Positive for decreased concentration and sleep disturbance. The patient is nervous/anxious.    Objective:   Today's Vitals   11/15/20 1120  BP: 132/85  Pulse: 60  Temp: 97.8 F (36.6 C)  SpO2: 99%  Weight: 155 lb 3.2 oz (70.4 kg)  Height: '5\' 6"'$  (1.676 m)   Body mass index is 25.05 kg/m.   Physical Exam Vitals and nursing note reviewed.  Constitutional:      Appearance: Normal appearance. She is well-developed.  HENT:     Head: Normocephalic and atraumatic.     Right Ear: Tympanic membrane, ear canal and external ear normal.     Left Ear: Tympanic membrane, ear canal and external ear normal.     Mouth/Throat:     Mouth: Mucous membranes are moist.     Pharynx: Oropharynx is clear.  Eyes:     Extraocular Movements: Extraocular movements intact.     Conjunctiva/sclera: Conjunctivae normal.     Pupils: Pupils are equal, round, and reactive to light.  Cardiovascular:     Rate and Rhythm: Normal rate and regular rhythm.     Pulses: Normal pulses.     Heart sounds: Normal heart sounds. No murmur heard. Pulmonary:     Effort: Pulmonary effort is normal.     Breath sounds: Normal breath sounds.  Abdominal:     Palpations: Abdomen is soft.  Musculoskeletal:        General: Normal range of motion.     Cervical back: Normal range of motion and neck supple.  Lymphadenopathy:     Cervical: No cervical adenopathy.  Skin:  General: Skin is warm and dry.     Capillary Refill: Capillary refill takes less than 2 seconds.  Neurological:     General: No focal deficit present.     Mental Status: She is alert and oriented to person, place, and time.  Psychiatric:        Mood and Affect:  Mood normal.        Behavior: Behavior normal.        Thought Content: Thought content normal.        Judgment: Judgment normal.    Assessment & Plan:  1. Encounter to establish care Appointment today to establish new primary care provider.  2. Essential hypertension Blood pressure stable.  Continue propranolol ER 60 mg capsules daily.  Should also continue hydrochlorothiazide 25 mg tablets daily when needed.  Continue to monitor closely. - propranolol ER (INDERAL LA) 60 MG 24 hr capsule; Take 1 capsule (60 mg total) by mouth daily.  Dispense: 90 capsule; Refill: 1  3. Attention and concentration deficit Patient took ASRS 1.1 today, scoring 6 out of 6.  This points to diagnosis of ADHD.  We will go back to dosing of Adderall 30 mg tablets up to twice daily if needed for focus and concentration.  Three, 30-day prescriptions were sent to her pharmacy today.  Dates are 11/15/2020, 12/22/2020, and 01/11/2021.  She did complete a controlled substances agreement today, 11/15/2020. - amphetamine-dextroamphetamine (ADDERALL) 30 MG tablet; Take 1 tablet by mouth 2 (two) times daily.  Dispense: 60 tablet; Refill: 0  4. GAD (generalized anxiety disorder) Patient doing well with Lexapro 10 mg tablets daily.  Continue as prescribed.  90-day prescription sent to pharmacy. - escitalopram (LEXAPRO) 10 MG tablet; Take 1 tablet (10 mg total) by mouth daily.  Dispense: 90 tablet; Refill: 1  5. Chronic allergic rhinitis Added back Singulair 10 mg tablets daily.  She should continue Claritin and nasal sprays as previously prescribed. - montelukast (SINGULAIR) 10 MG tablet; Take 1 tablet (10 mg total) by mouth at bedtime.  Dispense: 90 tablet; Refill: 1  6. Intractable episodic headache, unspecified headache type May take Ubrelvy 100 mg tablets as needed and as prescribed for acute headaches. - Ubrogepant (UBRELVY) 100 MG TABS; Take 1 tablet by mouth 2 (two) times daily as needed. Dx: G43.909, G44.52  Dispense:  10 tablet; Refill: 3   Problem List Items Addressed This Visit       Cardiovascular and Mediastinum   Essential hypertension   Relevant Medications   propranolol ER (INDERAL LA) 60 MG 24 hr capsule     Respiratory   Chronic allergic rhinitis   Relevant Medications   montelukast (SINGULAIR) 10 MG tablet     Other   Attention and concentration deficit   Relevant Medications   amphetamine-dextroamphetamine (ADDERALL) 30 MG tablet   GAD (generalized anxiety disorder)   Relevant Medications   escitalopram (LEXAPRO) 10 MG tablet   New daily persistent headache   Relevant Medications   escitalopram (LEXAPRO) 10 MG tablet   propranolol ER (INDERAL LA) 60 MG 24 hr capsule   Ubrogepant (UBRELVY) 100 MG TABS   Encounter to establish care - Primary    Outpatient Encounter Medications as of 11/15/2020  Medication Sig   ALPRAZolam (XANAX) 0.5 MG tablet Take 1 tablet (0.5 mg total) by mouth 2 (two) times daily as needed for anxiety.   aspirin EC 81 MG tablet Take 81 mg by mouth daily.   azelastine (ASTELIN) 0.1 % nasal spray Place 1 spray  into both nostrils 2 (two) times daily. Use in each nostril as directed   cholecalciferol (VITAMIN D) 1000 units tablet Take 2,000 Units by mouth daily.    clotrimazole-betamethasone (LOTRISONE) cream Apply 1 application topically 2 (two) times daily.   hydrochlorothiazide (HYDRODIURIL) 25 MG tablet Take 1 tablet (25 mg total) by mouth daily. Take 1 tablet QAM and 1/2 tablet QPM as needed   Ivermectin 1 % CREA    mometasone (ELOCON) 0.1 % cream Apply 1 application topically as needed.   tacrolimus (PROTOPIC) 0.1 % ointment APPLY TOPICALLY IN THE MORNING AND AT BEDTIME.   vitamin E 180 MG (400 UNITS) capsule Take 1 capsule by mouth daily.   [DISCONTINUED] amphetamine-dextroamphetamine (ADDERALL) 20 MG tablet Take 1 tablet (20 mg total) by mouth 2 (two) times daily.   [DISCONTINUED] amphetamine-dextroamphetamine (ADDERALL) 20 MG tablet Take 1 tablet (20 mg  total) by mouth 2 (two) times daily.   [DISCONTINUED] escitalopram (LEXAPRO) 10 MG tablet Take 1 tablet (10 mg total) by mouth daily.   [DISCONTINUED] mometasone (ELOCON) 0.1 % cream Apply 1 application topically daily as needed (Rash). 3 days per week prn to elbows and brow area   [DISCONTINUED] propranolol ER (INDERAL LA) 60 MG 24 hr capsule Take 1 capsule (60 mg total) by mouth daily.   [DISCONTINUED] SUMAtriptan (IMITREX) 50 MG tablet TAKE 1 TAB EVERY 2 HOURS AS NEEDED FOR MIGRAINE. MAY REPEAT IN 2 HRS IF HEADACHE PERSISTS OR RECURS   [DISCONTINUED] Ubrogepant (UBRELVY) 100 MG TABS Take 1 tablet by mouth 2 (two) times daily as needed. Dx: [id number], G44.52   amphetamine-dextroamphetamine (ADDERALL) 30 MG tablet Take 1 tablet by mouth 2 (two) times daily.   escitalopram (LEXAPRO) 10 MG tablet Take 1 tablet (10 mg total) by mouth daily.   montelukast (SINGULAIR) 10 MG tablet Take 1 tablet (10 mg total) by mouth at bedtime.   propranolol ER (INDERAL LA) 60 MG 24 hr capsule Take 1 capsule (60 mg total) by mouth daily.   Ubrogepant (UBRELVY) 100 MG TABS Take 1 tablet by mouth 2 (two) times daily as needed. Dx: G43.909, [email or web address]   [DISCONTINUED] amphetamine-dextroamphetamine (ADDERALL) 30 MG tablet Take 1 tablet by mouth 2 (two) times daily.   [DISCONTINUED] amphetamine-dextroamphetamine (ADDERALL) 30 MG tablet Take 1 tablet by mouth 2 (two) times daily.   [DISCONTINUED] doxycycline (VIBRA-TABS) 100 MG tablet Take 1 tablet (100 mg total) by mouth 2 (two) times daily. (Patient not taking: Reported on 11/15/2020)   [DISCONTINUED] montelukast (SINGULAIR) 10 MG tablet Take 1 tablet (10 mg total) by mouth at bedtime. (Patient not taking: Reported on 11/15/2020)   [DISCONTINUED] montelukast (SINGULAIR) 10 MG tablet Take 1 tablet (10 mg total) by mouth at bedtime. (Patient not taking: Reported on 11/15/2020)   No facility-administered encounter medications on file as of 11/15/2020.   This note was dictated using  Systems analyst. Rapid proofreading was performed to expedite the delivery of the information. Despite proofreading, phonetic errors will occur which are common with this voice recognition software. Please take this into consideration. If there are any concerns, please contact our office.    Follow-up: Return in about 3 months (around 02/15/2021) for CPE with FBW same day - add Free T4, T3, Vitamn d, and ferritin .   Ronnell Freshwater, NP

## 2020-11-18 ENCOUNTER — Encounter: Payer: Self-pay | Admitting: Nurse Practitioner

## 2020-11-18 ENCOUNTER — Other Ambulatory Visit: Payer: Self-pay | Admitting: Nurse Practitioner

## 2020-11-18 DIAGNOSIS — R41840 Attention and concentration deficit: Secondary | ICD-10-CM

## 2020-11-18 MED ORDER — AMPHETAMINE-DEXTROAMPHETAMINE 30 MG PO TABS: 30.0000 mg | ORAL_TABLET | Freq: Two times a day (BID) | ORAL | 0 refills | Status: DC

## 2020-11-18 NOTE — Progress Notes (Signed)
Single 30 day [facility] for [facility]

## 2020-11-24 ENCOUNTER — Telehealth: Payer: Self-pay

## 2020-11-24 NOTE — Telephone Encounter (Signed)
Returned records request with note stating records in Epic to [place] at [facility]  Fax# [phone]

## 2020-12-16 ENCOUNTER — Ambulatory Visit: Payer: BLUE CROSS/BLUE SHIELD | Admitting: Dermatology

## 2020-12-16 ENCOUNTER — Other Ambulatory Visit: Payer: Self-pay

## 2020-12-16 DIAGNOSIS — L719 Rosacea, unspecified: Secondary | ICD-10-CM | POA: Diagnosis not present

## 2020-12-16 DIAGNOSIS — L821 Other seborrheic keratosis: Secondary | ICD-10-CM

## 2020-12-16 DIAGNOSIS — L409 Psoriasis, unspecified: Secondary | ICD-10-CM

## 2020-12-16 DIAGNOSIS — L408 Other psoriasis: Secondary | ICD-10-CM

## 2020-12-16 MED ORDER — DOXYCYCLINE 40 MG PO CPDR: 40.0000 mg | DELAYED_RELEASE_CAPSULE | Freq: Every evening | ORAL | 6 refills | Status: DC

## 2020-12-16 MED ORDER — CALCIPOTRIENE 0.005 % EX CREA: TOPICAL_CREAM | CUTANEOUS | 6 refills | Status: AC

## 2020-12-16 NOTE — Patient Instructions (Addendum)
Instructions for [facility] Medications  One or more of your medications was sent to the [facility] mail order compounding pharmacy. You will receive an email from them and can purchase the medicine through that link. It will then be mailed to your home at the address you confirmed. If for any reason you do not receive an email from them, please check your spam folder. If you still do not find the email, please let us know. [facility] phone number is [phone].   If you have any questions or concerns for your doctor, please call our main line at [phone] and press option 4 to reach your doctor's medical assistant. If no one answers, please leave a voicemail as directed and we will return your call as soon as possible. Messages left after 4 pm will be answered the following business day.   You may also send us a message via MyChart. We typically respond to MyChart messages within 1-2 business days.  For prescription refills, please ask your pharmacy to contact our office. Our fax number is [phone].  If you have an urgent issue when the clinic is closed that cannot wait until the next business day, you can page your doctor at the number below.    Please note that while we do our best to be available for urgent issues outside of office hours, we are not available 24/7.   If you have an urgent issue and are unable to reach us, you may choose to seek medical care at your doctor's office, retail clinic, urgent care center, or emergency room.  If you have a medical emergency, please immediately call 911 or go to the emergency department.  Pager Numbers  - Dr. Kowalski: [phone]  - Dr. Moye: [phone]  - Dr. Stewart: [phone]  In the event of inclement weather, please call our main line at [phone] for an update on the status of any delays or closures.  Dermatology Medication Tips: Please keep the boxes that topical medications come in in order to help  keep track of the instructions about where and how to use these. Pharmacies typically print the medication instructions only on the boxes and not directly on the medication tubes.   If your medication is too expensive, please contact our office at [phone] option 4 or send us a message through MyChart.   We are unable to tell what your co-pay for medications will be in advance as this is different depending on your insurance coverage. However, we may be able to find a substitute medication at lower cost or fill out paperwork to get insurance to cover a needed medication.   If a prior authorization is required to get your medication covered by your insurance company, please allow us 1-2 business days to complete this process.  Drug prices often vary depending on where the prescription is filled and some pharmacies may offer cheaper prices.  The website www.goodrx.com contains coupons for medications through different pharmacies. The prices here do not account for what the cost may be with help from insurance (it may be cheaper with your insurance), but the website can give you the price if you did not use any insurance.  - You can print the associated coupon and take it with your prescription to the pharmacy.  - You may also stop by our office during regular business hours and pick up a GoodRx coupon card.  - If you need your prescription sent electronically to a different pharmacy, notify our office   through [place] [facility] MyChart or by phone at [phone] option 4.  

## 2020-12-16 NOTE — Progress Notes (Signed)
   Follow-Up Visit   Subjective  Jillian Edwards is a 55 y.o. female who presents for the following: Follow-up (6 month follow up of Inflamed SK of legs treated with LN2./6 month follow up of psoriasis/sebopsoriasis of elbows and brow areas treating with Mometasone 3 times per week prn).  The following portions of the chart were reviewed this encounter and updated as appropriate:   Tobacco  Allergies  Meds  Problems  Med Hx  Surg Hx  Fam Hx     Review of Systems:  No other skin or systemic complaints except as noted in HPI or Assessment and Plan.  Objective  Well appearing patient in no apparent distress; mood and affect are within normal limits.  A focused examination was performed including face, arms, legs. Relevant physical exam findings are noted in the Assessment and Plan.  Eyebrow areas Pinkness  Right Elbow - Posterior Clear today  Head - Anterior (Face) Pink papules of neck  Legs Stuck-on, waxy, tan-brown papule or plaque --Discussed benign etiology and prognosis.    Assessment & Plan  Sebopsoriasis Eyebrow areas  Area is traumatized from waxing brows  Start Calcipotriene cream qd Tuesday, Thursday, Saturday prn. Continue Mometasone cream qd Monday, Wednesday, Friday prn  Psoriasis is a chronic non-curable, but treatable genetic/hereditary disease that may have other systemic features affecting other organ systems such as joints (Psoriatic Arthritis). It is associated with an increased risk of inflammatory bowel disease, heart disease, non-alcoholic fatty liver disease, and depression.    calcipotriene (DOVONOX) 0.005 % cream - Eyebrow areas Apply topically 3 (three) times a week.  Psoriasis Right Elbow - Posterior  Psoriasis is a chronic non-curable, but treatable genetic/hereditary disease that may have other systemic features affecting other organ systems such as joints (Psoriatic Arthritis). It is associated with an increased risk of inflammatory  bowel disease, heart disease, non-alcoholic fatty liver disease, and depression.    Well controlled - continue Mometasone cream 3 times per week prn  Rosacea Head - Anterior (Face)  Rosacea is a chronic progressive skin condition usually affecting the face of adults, causing redness and/or acne bumps. It is treatable but not curable. It sometimes affects the eyes (ocular rosacea) as well. It may respond to topical and/or systemic medication and can flare with stress, sun exposure, alcohol, exercise and some foods.  Daily application of broad spectrum spf 30+ sunscreen to face is recommended to reduce flares.  Will prescribe [facility] metronidazole/ivermectin/azelaic acid twice daily as needed to affected areas on the face. The patient was advised this is not covered by insurance since it is made by a compounding pharmacy. They will receive an email to check out and the medication will be mailed to their home.    Start Doxycycline 40 mg 1 po qd with food and plenty of fluid  doxycycline (ORACEA) 40 MG capsule - Head - Anterior (Face) Take 1 capsule (40 mg total) by mouth every evening. With food and plenty of fluid  Seborrheic keratosis Legs Benign, observe.  Return in about 4 months (around 04/17/2021) for Follow up, TBSE.  I, Ashok Cordia, CMA, am acting as scribe for Sarina Ser, MD . Documentation: I have reviewed the above documentation for accuracy and completeness, and I agree with the above.  Sarina Ser, MD

## 2020-12-17 ENCOUNTER — Other Ambulatory Visit: Payer: Self-pay

## 2020-12-17 MED ORDER — DOXYCYCLINE HYCLATE 20 MG PO TABS: 20.0000 mg | ORAL_TABLET | Freq: Two times a day (BID) | ORAL | 3 refills | Status: DC

## 2020-12-17 NOTE — Progress Notes (Signed)
Doxycycline 40 mg po QD not covered by insurance. Alternative sent. 

## 2020-12-20 ENCOUNTER — Other Ambulatory Visit: Payer: Self-pay | Admitting: Nurse Practitioner

## 2020-12-20 DIAGNOSIS — R41840 Attention and concentration deficit: Secondary | ICD-10-CM

## 2020-12-20 MED ORDER — AMPHETAMINE-DEXTROAMPHETAMINE 30 MG PO TABS: 30.0000 mg | ORAL_TABLET | Freq: Two times a day (BID) | ORAL | 0 refills | Status: DC

## 2020-12-20 NOTE — Progress Notes (Signed)
I sent three 30 day prescriptions for adderall  to [facility] for you. The dates are today, 9/26/202, 01/17/2021, and 02/15/2021.

## 2020-12-22 ENCOUNTER — Encounter: Payer: Self-pay | Admitting: Dermatology

## 2020-12-30 ENCOUNTER — Encounter: Payer: Self-pay | Admitting: Nurse Practitioner

## 2021-01-19 ENCOUNTER — Other Ambulatory Visit: Payer: Self-pay | Admitting: Nurse Practitioner

## 2021-01-19 DIAGNOSIS — I10 Essential (primary) hypertension: Secondary | ICD-10-CM

## 2021-02-03 ENCOUNTER — Encounter: Payer: BLUE CROSS/BLUE SHIELD | Admitting: Nurse Practitioner

## 2021-02-15 ENCOUNTER — Other Ambulatory Visit: Payer: Self-pay

## 2021-02-15 ENCOUNTER — Ambulatory Visit (INDEPENDENT_AMBULATORY_CARE_PROVIDER_SITE_OTHER): Payer: BC Managed Care – PPO | Admitting: Nurse Practitioner

## 2021-02-15 ENCOUNTER — Encounter: Payer: Self-pay | Admitting: Nurse Practitioner

## 2021-02-15 VITALS — BP 148/87 | HR 79 | Temp 98.7°F | Ht 66.0 in | Wt 155.6 lb

## 2021-02-15 DIAGNOSIS — Z0001 Encounter for general adult medical examination with abnormal findings: Secondary | ICD-10-CM | POA: Diagnosis not present

## 2021-02-15 DIAGNOSIS — J309 Allergic rhinitis, unspecified: Secondary | ICD-10-CM

## 2021-02-15 DIAGNOSIS — R519 Headache, unspecified: Secondary | ICD-10-CM

## 2021-02-15 DIAGNOSIS — R41840 Attention and concentration deficit: Secondary | ICD-10-CM

## 2021-02-15 DIAGNOSIS — J0141 Acute recurrent pansinusitis: Secondary | ICD-10-CM | POA: Diagnosis not present

## 2021-02-15 DIAGNOSIS — E559 Vitamin D deficiency, unspecified: Secondary | ICD-10-CM

## 2021-02-15 DIAGNOSIS — R5383 Other fatigue: Secondary | ICD-10-CM

## 2021-02-15 DIAGNOSIS — I10 Essential (primary) hypertension: Secondary | ICD-10-CM

## 2021-02-15 DIAGNOSIS — F411 Generalized anxiety disorder: Secondary | ICD-10-CM

## 2021-02-15 DIAGNOSIS — E663 Overweight: Secondary | ICD-10-CM

## 2021-02-15 MED ORDER — AMOXICILLIN 875 MG PO TABS: 875.0000 mg | ORAL_TABLET | Freq: Two times a day (BID) | ORAL | 0 refills | Status: DC

## 2021-02-15 MED ORDER — PROPRANOLOL HCL ER 60 MG PO CP24: 60.0000 mg | ORAL_CAPSULE | Freq: Every day | ORAL | 1 refills | Status: DC

## 2021-02-15 MED ORDER — AMPHETAMINE-DEXTROAMPHETAMINE 30 MG PO TABS: 30.0000 mg | ORAL_TABLET | Freq: Two times a day (BID) | ORAL | 0 refills | Status: DC

## 2021-02-15 MED ORDER — HYDROCHLOROTHIAZIDE 25 MG PO TABS: ORAL_TABLET | ORAL | 1 refills | Status: DC

## 2021-02-15 MED ORDER — MONTELUKAST SODIUM 10 MG PO TABS: 10.0000 mg | ORAL_TABLET | Freq: Every day | ORAL | 1 refills | Status: DC

## 2021-02-15 MED ORDER — ESCITALOPRAM OXALATE 10 MG PO TABS
10.0000 mg | ORAL_TABLET | Freq: Every day | ORAL | 1 refills | Status: DC
Start: 2021-02-15 — End: 2021-05-20

## 2021-02-15 MED ORDER — UBRELVY 100 MG PO TABS: 1.0000 | ORAL_TABLET | Freq: Two times a day (BID) | ORAL | 3 refills | Status: DC | PRN

## 2021-02-15 NOTE — Progress Notes (Signed)
Established Patient Office Visit  Subjective:  Patient ID: Jillian Edwards, female    DOB: 1965-07-02  Age: 55 y.o. MRN: [id number]  CC:  Chief Complaint  Patient presents with   Annual Exam     HPI [name] presents for annual wellness visit. Having some increased personal stress. Feels as though she is getting sinus infection. Has frontal and maxillary sinus pain. This is worse on the left side than the right. Her left ear is hurting. She is blowing out blood tinged mucus overtime she blows her nose. She has frontal sinus pain.  She does have history of perennial allergic rhinitis. She has history of migraine headaches.  [name] helps to relieve acute headaches.  Does need refill today. She has history of adult ADD.  She does take Adderall 30 mg twice daily.  Today, she will sign a controlled substances agreement policy for primary care at [facility].  Review of her PDMP profile shows overdose risk score of 110 with appropriate fill history.  She is due to have refills for this today. She is due to have routine, fasting labs.   Past Medical History:  Diagnosis Date   ADD (attention deficit disorder)    Anxiety    Depression    Dysplastic nevus 11/07/2017   left lat epigastric, right mid post thigh, right lat buttocks, left sup med buttocks   Dysplastic nevus 10/14/2018   right sup lat buttock   Dysplastic nevus 04/24/2019   low back spinal, right low back 7.0 cm lat to spine   Frequent urination    Headache    Herpes genitalis    Hypertension    Stress headaches    Stroke ([facility]) ~2005    loss of peripheral vision   Stroke or transient ischemic attack (TIA) diagnosed during current admission    Urological disorder    bladder has dropped    Past Surgical History:  Procedure Laterality Date   BREAST BIOPSY Left 02/06/2013   stereotactic biopsy of calcifications-benign   BREAST ENHANCEMENT SURGERY  08/2003   COLONOSCOPY WITH PROPOFOL N/A 12/14/2016    Procedure: COLONOSCOPY WITH PROPOFOL;  Surgeon: Jonathon Bellows, MD;  Location: [facility] ENDOSCOPY;  Service: Gastroenterology;  Laterality: N/A;   COLPOSCOPY  03/1998; 07/1999   2000-no lesion; 2001-CIN1   ENDOMETRIAL BIOPSY  03/2007   proliferative    Family History  Problem Relation Age of Onset   Hypertension Father    Diabetes Father        Has also had an amputation   Heart attack Father    Diabetes Mellitus II Father    Diabetes Mellitus II Brother    Hypertension Brother    Breast cancer Maternal Grandmother 75   Diabetes Mellitus II Paternal Grandmother     Social History   Socioeconomic History   Marital status: Legally Separated    Spouse name: Not on file   Number of children: 1   Years of education: 12   Highest education level: Not on file  Occupational History   Occupation: Corporate treasurer  Tobacco Use   Smoking status: Never   Smokeless tobacco: Never  Vaping Use   Vaping Use: Never used  Substance and Sexual Activity   Alcohol use: No    Alcohol/week: 0.0 standard drinks   Drug use: No   Sexual activity: Yes    Birth control/protection: I.U.D.    Comment: Mirena  Other Topics Concern   Not on file  Social History Narrative  Not on file   Social Determinants of Health   Financial Resource Strain: Not on file  Food Insecurity: Not on file  Transportation Needs: Not on file  Physical Activity: Not on file  Stress: Not on file  Social Connections: Not on file  Intimate Partner Violence: Not on file    Outpatient Medications Prior to Visit  Medication Sig Dispense Refill   ALPRAZolam (XANAX) 0.5 MG tablet Take 1 tablet (0.5 mg total) by mouth 2 (two) times daily as needed for anxiety. 60 tablet 3   aspirin EC 81 MG tablet Take 81 mg by mouth daily.     azelastine (ASTELIN) 0.1 % nasal spray Place 1 spray into both nostrils 2 (two) times daily. Use in each nostril as directed 30 mL 3   calcipotriene (DOVONOX) 0.005 % cream Apply topically 3  (three) times a week. 60 g 6   cholecalciferol (VITAMIN D) 1000 units tablet Take 2,000 Units by mouth daily.      clotrimazole-betamethasone (LOTRISONE) cream Apply 1 application topically 2 (two) times daily. 45 g 1   Ivermectin 1 % CREA      mometasone (ELOCON) 0.1 % cream Apply 1 application topically as needed.     tacrolimus (PROTOPIC) 0.1 % ointment APPLY TOPICALLY IN THE MORNING AND AT BEDTIME. 100 g 0   vitamin E 180 MG (400 UNITS) capsule Take 1 capsule by mouth daily.     amphetamine-dextroamphetamine (ADDERALL) 30 MG tablet Take 1 tablet by mouth 2 (two) times daily. 60 tablet 0   doxycycline (ORACEA) 40 MG capsule Take 1 capsule (40 mg total) by mouth every evening. With food and plenty of fluid 30 capsule 6   escitalopram (LEXAPRO) 10 MG tablet Take 1 tablet (10 mg total) by mouth daily. 90 tablet 1   hydrochlorothiazide (HYDRODIURIL) 25 MG tablet TAKE 1 TABLET BY MOUTH ONCE EVERY MORNING AND 1/2 TABLET ONCE EVERY EVENING AS NEEDED 45 tablet 0   montelukast (SINGULAIR) 10 MG tablet Take 1 tablet (10 mg total) by mouth at bedtime. 90 tablet 1   propranolol ER (INDERAL LA) 60 MG 24 hr capsule Take 1 capsule (60 mg total) by mouth daily. 90 capsule 1   Ubrogepant (UBRELVY) 100 MG TABS Take 1 tablet by mouth 2 (two) times daily as needed. Dx: K99.833, G44.52 10 tablet 3   No facility-administered medications prior to visit.    No Known Allergies  ROS Review of Systems  Constitutional:  Positive for fatigue. Negative for activity change, appetite change, chills and fever.  HENT:  Positive for congestion, postnasal drip, rhinorrhea, sinus pressure, sinus pain and sneezing. Negative for sore throat.   Eyes: Negative.   Respiratory:  Negative for cough, chest tightness, shortness of breath and wheezing.   Cardiovascular:  Negative for chest pain and palpitations.  Gastrointestinal:  Negative for abdominal pain, constipation, diarrhea, nausea and vomiting.  Endocrine: Negative for  cold intolerance, heat intolerance, polydipsia and polyuria.  Genitourinary:  Negative for dyspareunia, dysuria, flank pain, frequency and urgency.  Musculoskeletal:  Negative for arthralgias, back pain and myalgias.  Skin:  Negative for rash.  Allergic/Immunologic: Positive for environmental allergies.  Neurological:  Positive for headaches. Negative for dizziness and weakness.  Hematological:  Positive for adenopathy.  Psychiatric/Behavioral:  Positive for decreased concentration and dysphoric mood. The patient is nervous/anxious.      Objective:    Physical Exam Vitals and nursing note reviewed.  Constitutional:      Appearance: Normal appearance. She is well-developed.  HENT:     Head: Normocephalic and atraumatic.     Right Ear: Tympanic membrane, ear canal and external ear normal.     Left Ear: Tympanic membrane, ear canal and external ear normal.     Nose: Congestion and rhinorrhea present.     Right Sinus: Frontal sinus tenderness present.     Left Sinus: Frontal sinus tenderness present.     Mouth/Throat:     Mouth: Mucous membranes are dry.     Pharynx: Oropharynx is clear.  Eyes:     Extraocular Movements: Extraocular movements intact.     Conjunctiva/sclera: Conjunctivae normal.     Pupils: Pupils are equal, round, and reactive to light.  Neck:     Vascular: No carotid bruit.  Cardiovascular:     Rate and Rhythm: Normal rate and regular rhythm.     Pulses: Normal pulses.     Heart sounds: Normal heart sounds.  Pulmonary:     Effort: Pulmonary effort is normal.     Breath sounds: Normal breath sounds.  Chest:  Breasts:    Right: Normal. No swelling, bleeding, inverted nipple, mass, nipple discharge, skin change or tenderness.     Left: Normal. No swelling, bleeding, inverted nipple, mass, nipple discharge, skin change or tenderness.     Comments: Bilateral breast implants intact.  Abdominal:     General: Bowel sounds are normal. There is no distension.      Palpations: Abdomen is soft. There is no mass.     Tenderness: There is no abdominal tenderness. There is no guarding or rebound.     Hernia: No hernia is present.  Musculoskeletal:        General: Normal range of motion.     Cervical back: Normal range of motion and neck supple.  Lymphadenopathy:     Cervical: No cervical adenopathy.     Upper Body:     Right upper body: No axillary adenopathy.     Left upper body: No axillary adenopathy.  Skin:    General: Skin is warm and dry.     Capillary Refill: Capillary refill takes less than 2 seconds.  Neurological:     General: No focal deficit present.     Mental Status: She is alert and oriented to person, place, and time.  Psychiatric:        Mood and Affect: Mood normal.        Behavior: Behavior normal.        Thought Content: Thought content normal.        Judgment: Judgment normal.    Today's Vitals   02/15/21 0858  BP: (!) 148/87  Pulse: 79  Temp: 98.7 F (37.1 C)  SpO2: 100%  Weight: 155 lb 9.6 oz (70.6 kg)  Height: 5' 6"  (1.676 m)   Body mass index is 25.11 kg/m.   Wt Readings from Last 3 Encounters:  02/15/21 155 lb 9.6 oz (70.6 kg)  11/15/20 155 lb 3.2 oz (70.4 kg)  08/17/20 147 lb (66.7 kg)     Health Maintenance Due  Topic Date Due   HIV Screening  Never done   Zoster Vaccines- Shingrix (1 of 2) Never done   COVID-19 Vaccine (4 - Booster for Pfizer series) 05/07/2020    There are no preventive care reminders to display for this patient.  Lab Results  Component Value Date   TSH 1.200 02/15/2021   Lab Results  Component Value Date   WBC 4.8 02/15/2021   HGB 14.4 02/15/2021  HCT 42.3 02/15/2021   MCV 94 02/15/2021   PLT 295 02/15/2021   Lab Results  Component Value Date   NA 143 02/15/2021   K 4.2 02/15/2021   CO2 27 02/15/2021   GLUCOSE 94 02/15/2021   BUN 10 02/15/2021   CREATININE 0.73 02/15/2021   BILITOT 0.2 02/15/2021   ALKPHOS 153 (H) 02/15/2021   AST 18 02/15/2021   ALT 19  02/15/2021   PROT 6.8 02/15/2021   ALBUMIN 4.5 02/15/2021   CALCIUM 10.6 (H) 02/15/2021   EGFR 97 02/15/2021   Lab Results  Component Value Date   CHOL 183 02/15/2021   Lab Results  Component Value Date   HDL 69 02/15/2021   Lab Results  Component Value Date   LDLCALC 92 02/15/2021   Lab Results  Component Value Date   TRIG 126 02/15/2021   Lab Results  Component Value Date   CHOLHDL 2.7 02/15/2021   No results found for: HGBA1C    Assessment & Plan:  1. Encounter for general adult medical examination with abnormal findings Annual wellness visit today.  Routine, fasting labs drawn today. - CBC with Differential/Platelet - Comprehensive metabolic panel - T4, free - TSH - Lipid panel  2. Acute recurrent pansinusitis Amoxicillin 875 mg twice daily for next 7 days.  Use medications for allergies as previously prescribed. Rest and increase fluids. Continue using OTC medication to control symptoms.   - amoxicillin (AMOXIL) 875 MG tablet; Take 1 tablet (875 mg total) by mouth 2 (two) times daily.  Dispense: 14 tablet; Refill: 0  3. Chronic allergic rhinitis Restart Singulair daily.  Continue other allergy medications as prescribed. - montelukast (SINGULAIR) 10 MG tablet; Take 1 tablet (10 mg total) by mouth at bedtime.  Dispense: 90 tablet; Refill: 1  4. Essential hypertension Blood pressure stable.  Continue medications as prescribed.  Refills provided today - hydrochlorothiazide (HYDRODIURIL) 25 MG tablet; TAKE 1 TABLET BY MOUTH ONCE EVERY MORNING AND 1/2 TABLET ONCE EVERY EVENING AS NEEDED  Dispense: 45 tablet; Refill: 1 - propranolol ER (INDERAL LA) 60 MG 24 hr capsule; Take 1 capsule (60 mg total) by mouth daily.  Dispense: 90 capsule; Refill: 1 - CBC with Differential/Platelet - Comprehensive metabolic panel  5. GAD (generalized anxiety disorder) Well managed.  Continue Lexapro and Wellbutrin as prescribed.  New prescription for Lexapro sent to her pharmacy  today. - escitalopram (LEXAPRO) 10 MG tablet; Take 1 tablet (10 mg total) by mouth daily.  Dispense: 90 tablet; Refill: 1  6. Attention and concentration deficit Patient may continue Adderall 30 mg twice daily as needed.  She has a signed controlled substances agreement for primary care at [facility]. Three 30-day prescriptions were sent to her pharmacy today.  Dates are 02/15/2021, 03/15/2021, and 04/13/2021. - amphetamine-dextroamphetamine (ADDERALL) 30 MG tablet; Take 1 tablet by mouth 2 (two) times daily.  Dispense: 60 tablet; Refill: 0  7. Intractable episodic headache, unspecified headache type New prescription for Ubrelvy sent to her pharmacy.  Use as needed for acute migraines. - Ubrogepant (UBRELVY) 100 MG TABS; Take 1 tablet by mouth 2 (two) times daily as needed. Dx: G43.909, G44.52  Dispense: 10 tablet; Refill: 3  8. Other fatigue Check thyroid panel for further evaluation. - T4, free - TSH  9. Vitamin D deficiency Check vitamin D and treat deficiency as indicated. - Vitamin D 1,25 dihydroxy  10. Overweight with body mass index (BMI) 25.0-29.9 Discussed lowering calorie intake to 1500 calories per day and incorporating exercise into  daily routine to help lose weight. Will monitor.    Problem List Items Addressed This Visit       Cardiovascular and Mediastinum   Essential hypertension   Relevant Medications   hydrochlorothiazide (HYDRODIURIL) 25 MG tablet   propranolol ER (INDERAL LA) 60 MG 24 hr capsule   Other Relevant Orders   CBC with Differential/Platelet (Completed)   Comprehensive metabolic panel (Completed)     Respiratory   Acute recurrent pansinusitis   Relevant Medications   amoxicillin (AMOXIL) 875 MG tablet   Chronic allergic rhinitis   Relevant Medications   montelukast (SINGULAIR) 10 MG tablet     Other   Attention and concentration deficit   Relevant Medications   amphetamine-dextroamphetamine (ADDERALL) 30 MG tablet   GAD (generalized anxiety  disorder)   Relevant Medications   escitalopram (LEXAPRO) 10 MG tablet   Other fatigue   Relevant Orders   T4, free (Completed)   TSH (Completed)   Encounter for general adult medical examination with abnormal findings - Primary   Relevant Orders   CBC with Differential/Platelet (Completed)   Comprehensive metabolic panel (Completed)   T4, free (Completed)   TSH (Completed)   Lipid panel (Completed)   Intractable episodic headache   Relevant Medications   escitalopram (LEXAPRO) 10 MG tablet   propranolol ER (INDERAL LA) 60 MG 24 hr capsule   Ubrogepant (UBRELVY) 100 MG TABS   Vitamin D deficiency   Relevant Orders   Vitamin D 1,25 dihydroxy   Overweight with body mass index (BMI) 25.0-29.9    Meds ordered this encounter  Medications   DISCONTD: amphetamine-dextroamphetamine (ADDERALL) 30 MG tablet    Sig: Take 1 tablet by mouth 2 (two) times daily.    Dispense:  60 tablet    Refill:  0    Order Specific Question:   Supervising Provider    Answer:   Beatrice Lecher D [[id number]]   escitalopram (LEXAPRO) 10 MG tablet    Sig: Take 1 tablet (10 mg total) by mouth daily.    Dispense:  90 tablet    Refill:  1    Order Specific Question:   Supervising Provider    Answer:   Beatrice Lecher D [[id number]]   hydrochlorothiazide (HYDRODIURIL) 25 MG tablet    Sig: TAKE 1 TABLET BY MOUTH ONCE EVERY MORNING AND 1/2 TABLET ONCE EVERY EVENING AS NEEDED    Dispense:  45 tablet    Refill:  1    Order Specific Question:   Supervising Provider    Answer:   Beatrice Lecher D [[id number]]   montelukast (SINGULAIR) 10 MG tablet    Sig: Take 1 tablet (10 mg total) by mouth at bedtime.    Dispense:  90 tablet    Refill:  1    Order Specific Question:   Supervising Provider    Answer:   Beatrice Lecher D [[id number]]   propranolol ER (INDERAL LA) 60 MG 24 hr capsule    Sig: Take 1 capsule (60 mg total) by mouth daily.    Dispense:  90 capsule    Refill:  1    Order Specific Question:    Supervising Provider    Answer:   Beatrice Lecher D [[id number]]   Ubrogepant (UBRELVY) 100 MG TABS    Sig: Take 1 tablet by mouth 2 (two) times daily as needed. Dx: W38.882, G44.52    Dispense:  10 tablet    Refill:  3    PA approved 06/09/20 to 06/08/21  Order Specific Question:   Supervising Provider    Answer:   Hali Marry [[id number]]   amoxicillin (AMOXIL) 875 MG tablet    Sig: Take 1 tablet (875 mg total) by mouth 2 (two) times daily.    Dispense:  14 tablet    Refill:  0    Order Specific Question:   Supervising Provider    Answer:   Beatrice Lecher D [[id number]]   DISCONTD: amphetamine-dextroamphetamine (ADDERALL) 30 MG tablet    Sig: Take 1 tablet by mouth 2 (two) times daily.    Dispense:  60 tablet    Refill:  0    Fill after 03/15/2021    Order Specific Question:   Supervising Provider    Answer:   Beatrice Lecher D [[id number]]   amphetamine-dextroamphetamine (ADDERALL) 30 MG tablet    Sig: Take 1 tablet by mouth 2 (two) times daily.    Dispense:  60 tablet    Refill:  0    Fill after 04/13/2021    Order Specific Question:   Supervising Provider    Answer:   Beatrice Lecher D [[id number]]     Follow-up: Return in about 3 months (around 05/18/2021) for blood pressure, ADD.    Ronnell Freshwater, NP  This note was dictated using Systems analyst. Rapid proofreading was performed to expedite the delivery of the information. Despite proofreading, phonetic errors will occur which are common with this voice recognition software. Please take this into consideration. If there are any concerns, please contact our office.

## 2021-02-16 ENCOUNTER — Other Ambulatory Visit: Payer: Self-pay | Admitting: Nurse Practitioner

## 2021-02-16 LAB — CBC WITH DIFFERENTIAL/PLATELET
Basophils Absolute: 0 10*3/uL (ref 0.0–0.2)
Basos: 0 %
EOS (ABSOLUTE): 0.1 10*3/uL (ref 0.0–0.4)
Eos: 1 %
Hematocrit: 42.3 % (ref 34.0–46.6)
Hemoglobin: 14.4 g/dL (ref 11.1–15.9)
Immature Grans (Abs): 0 10*3/uL (ref 0.0–0.1)
Immature Granulocytes: 0 %
Lymphocytes Absolute: 1.4 10*3/uL (ref 0.7–3.1)
Lymphs: 30 %
MCH: 31.9 pg (ref 26.6–33.0)
MCHC: 34 g/dL (ref 31.5–35.7)
MCV: 94 fL (ref 79–97)
Monocytes Absolute: 0.3 10*3/uL (ref 0.1–0.9)
Monocytes: 6 %
Neutrophils Absolute: 3 10*3/uL (ref 1.4–7.0)
Neutrophils: 63 %
Platelets: 295 10*3/uL (ref 150–450)
RBC: 4.51 x10E6/uL (ref 3.77–5.28)
RDW: 12.1 % (ref 11.7–15.4)
WBC: 4.8 10*3/uL (ref 3.4–10.8)

## 2021-02-16 LAB — LIPID PANEL
Chol/HDL Ratio: 2.7 ratio (ref 0.0–4.4)
Cholesterol, Total: 183 mg/dL (ref 100–199)
HDL: 69 mg/dL (ref >39)
LDL Chol Calc (NIH): 92 mg/dL (ref 0–99)
Triglycerides: 126 mg/dL (ref 0–149)
VLDL Cholesterol Cal: 22 mg/dL (ref 5–40)

## 2021-02-16 LAB — COMPREHENSIVE METABOLIC PANEL
ALT: 19 IU/L (ref 0–32)
AST: 18 IU/L (ref 0–40)
Albumin/Globulin Ratio: 2 (ref 1.2–2.2)
Albumin: 4.5 g/dL (ref 3.8–4.9)
Alkaline Phosphatase: 153 IU/L — ABNORMAL HIGH (ref 44–121)
BUN/Creatinine Ratio: 14 (ref 9–23)
BUN: 10 mg/dL (ref 6–24)
Bilirubin Total: 0.2 mg/dL (ref 0.0–1.2)
CO2: 27 mmol/L (ref 20–29)
Calcium: 10.6 mg/dL — ABNORMAL HIGH (ref 8.7–10.2)
Chloride: 104 mmol/L (ref 96–106)
Creatinine, Ser: 0.73 mg/dL (ref 0.57–1.00)
Globulin, Total: 2.3 g/dL (ref 1.5–4.5)
Glucose: 94 mg/dL (ref 70–99)
Potassium: 4.2 mmol/L (ref 3.5–5.2)
Sodium: 143 mmol/L (ref 134–144)
Total Protein: 6.8 g/dL (ref 6.0–8.5)
eGFR: 97 mL/min/{1.73_m2} (ref >59)

## 2021-02-16 LAB — VITAMIN D 25 HYDROXY (VIT D DEFICIENCY, FRACTURES): Vit D, 25-Hydroxy: 29.7 ng/mL — ABNORMAL LOW (ref 30.0–100.0)

## 2021-02-16 LAB — TSH: TSH: 1.2 u[IU]/mL (ref 0.450–4.500)

## 2021-02-16 LAB — T4, FREE: Free T4: 1.17 ng/dL (ref 0.82–1.77)

## 2021-02-16 NOTE — Progress Notes (Signed)
Vtamin d mildly low with other labs ormal. My chart note sent to patient.

## 2021-02-20 DIAGNOSIS — Z6827 Body mass index (BMI) 27.0-27.9, adult: Secondary | ICD-10-CM | POA: Insufficient documentation

## 2021-02-20 DIAGNOSIS — Z6828 Body mass index (BMI) 28.0-28.9, adult: Secondary | ICD-10-CM | POA: Insufficient documentation

## 2021-02-20 DIAGNOSIS — E559 Vitamin D deficiency, unspecified: Secondary | ICD-10-CM | POA: Insufficient documentation

## 2021-02-20 DIAGNOSIS — E663 Overweight: Secondary | ICD-10-CM | POA: Insufficient documentation

## 2021-02-20 DIAGNOSIS — R519 Headache, unspecified: Secondary | ICD-10-CM | POA: Insufficient documentation

## 2021-02-20 NOTE — Patient Instructions (Signed)
Fat and Cholesterol Restricted Eating Plan Getting too much fat and cholesterol in your diet may cause health problems. Choosing the right foods helps keep your fat and cholesterol at normal levels. This can keep you from getting certain diseases. Your doctor may recommend an eating plan that includes: Total fat: ______% or less of total calories a day. This is ______g of fat a day. Saturated fat: ______% or less of total calories a day. This is ______g of saturated fat a day. Cholesterol: less than _________mg a day. Fiber: ______g a day. What are tips for following this plan? General tips Work with your doctor to lose weight if you need to. Avoid: Foods with added sugar. Fried foods. Foods with trans fat or partially hydrogenated oils. This includes some margarines and baked goods. If you drink alcohol: Limit how much you have to: 0-1 drink a day for women who are not pregnant. 0-2 drinks a day for men. Know how much alcohol is in a drink. In the [place] one drink equals one 12 oz bottle of beer (355 mL), one 5 oz glass of wine (148 mL), or one 1 oz glass of hard liquor (44 mL). Reading food labels Check food labels for: Trans fats. Partially hydrogenated oils. Saturated fat (g) in each serving. Cholesterol (mg) in each serving. Fiber (g) in each serving. Choose foods with healthy fats, such as: Monounsaturated fats and polyunsaturated fats. These include olive and canola oil, flaxseeds, walnuts, almonds, and seeds. Omega-3 fats. These are found in certain fish, flaxseed oil, and ground flaxseeds. Choose grain products that have whole grains. Look for the word "whole" as the first word in the ingredient list. Cooking Cook foods using low-fat methods. These include baking, boiling, grilling, and broiling. Eat more home-cooked foods. Eat at restaurants and buffets less often. Eat less fast food. Avoid cooking using saturated fats, such as butter, cream, palm oil, palm kernel oil, and  coconut oil. Meal planning  At meals, divide your plate into four equal parts: Fill one-half of your plate with vegetables, green salads, and fruit. Fill one-fourth of your plate with whole grains. Fill one-fourth of your plate with low-fat (lean) protein foods. Eat fish that is high in omega-3 fats at least two times a week. This includes mackerel, tuna, sardines, and salmon. Eat foods that are high in fiber, such as whole grains, beans, apples, pears, berries, broccoli, carrots, peas, and barley. What foods should I eat? Fruits All fresh, canned (in natural juice), or frozen fruits. Vegetables Fresh or frozen vegetables (raw, steamed, roasted, or grilled). Green salads. Grains Whole grains, such as whole wheat or whole grain breads, crackers, cereals, and pasta. Unsweetened oatmeal, bulgur, barley, quinoa, or brown rice. Corn or whole wheat flour tortillas. Meats and other protein foods Ground beef (85% or leaner), grass-fed beef, or beef trimmed of fat. Skinless chicken or turkey. Ground chicken or turkey. Pork trimmed of fat. All fish and seafood. Egg whites. Dried beans, peas, or lentils. Unsalted nuts or seeds. Unsalted canned beans. Nut butters without added sugar or oil. Dairy Low-fat or nonfat dairy products, such as skim or 1% milk, 2% or reduced-fat cheeses, low-fat and fat-free ricotta or cottage cheese, or plain low-fat and nonfat yogurt. Fats and oils Tub margarine without trans fats. Light or reduced-fat mayonnaise and salad dressings. Avocado. Olive, canola, sesame, or safflower oils. The items listed above may not be a complete list of foods and beverages you can eat. Contact a dietitian for more information. What foods   should I avoid? Fruits Canned fruit in heavy syrup. Fruit in cream or butter sauce. Fried fruit. Vegetables Vegetables cooked in cheese, cream, or butter sauce. Fried vegetables. Grains White bread. White pasta. White rice. Cornbread. Bagels, pastries,  and croissants. Crackers and snack foods that contain trans fat and hydrogenated oils. Meats and other protein foods Fatty cuts of meat. Ribs, chicken wings, bacon, sausage, bologna, salami, chitterlings, fatback, hot dogs, bratwurst, and packaged lunch meats. Liver and organ meats. Whole eggs and egg yolks. Chicken and turkey with skin. Fried meat. Dairy Whole or 2% milk, cream, half-and-half, and cream cheese. Whole milk cheeses. Whole-fat or sweetened yogurt. Full-fat cheeses. Nondairy creamers and whipped toppings. Processed cheese, cheese spreads, and cheese curds. Fats and oils Butter, stick margarine, lard, shortening, ghee, or bacon fat. Coconut, palm kernel, and palm oils. Beverages Alcohol. Sugar-sweetened drinks such as sodas, lemonade, and fruit drinks. Sweets and desserts Corn syrup, sugars, honey, and molasses. Candy. Jam and jelly. Syrup. Sweetened cereals. Cookies, pies, cakes, donuts, muffins, and ice cream. The items listed above may not be a complete list of foods and beverages you should avoid. Contact a dietitian for more information. Summary Choosing the right foods helps keep your fat and cholesterol at normal levels. This can keep you from getting certain diseases. At meals, fill one-half of your plate with vegetables, green salads, and fruits. Eat high fiber foods, like whole grains, beans, apples, pears, berries, carrots, peas, and barley. Limit added sugar, saturated fats, alcohol, and fried foods. This information is not intended to replace advice given to you by your health care provider. Make sure you discuss any questions you have with your health care provider. Document Revised: 07/23/2020 Document Reviewed: 07/23/2020 Elsevier Patient Education  2022 [employer] Inc[employer]  

## 2021-02-25 ENCOUNTER — Other Ambulatory Visit: Payer: Self-pay | Admitting: Dermatology

## 2021-02-28 ENCOUNTER — Other Ambulatory Visit: Payer: Self-pay | Admitting: Nurse Practitioner

## 2021-02-28 ENCOUNTER — Encounter: Payer: Self-pay | Admitting: Nurse Practitioner

## 2021-02-28 DIAGNOSIS — R41840 Attention and concentration deficit: Secondary | ICD-10-CM

## 2021-02-28 MED ORDER — AMPHETAMINE-DEXTROAMPHETAMINE 20 MG PO TABS: 30.0000 mg | ORAL_TABLET | Freq: Two times a day (BID) | ORAL | 0 refills | Status: DC

## 2021-03-14 ENCOUNTER — Encounter: Payer: Self-pay | Admitting: Nurse Practitioner

## 2021-03-15 NOTE — Telephone Encounter (Signed)
Called pt LVM to contact the office °

## 2021-03-16 NOTE — Telephone Encounter (Signed)
Called pt LVM for pt to contact office

## 2021-03-17 NOTE — Telephone Encounter (Signed)
Called pt 3 attempt VM is full

## 2021-04-11 ENCOUNTER — Encounter: Payer: Self-pay | Admitting: Nurse Practitioner

## 2021-04-11 ENCOUNTER — Other Ambulatory Visit: Payer: Self-pay | Admitting: Nurse Practitioner

## 2021-04-11 DIAGNOSIS — F411 Generalized anxiety disorder: Secondary | ICD-10-CM

## 2021-04-11 DIAGNOSIS — R41840 Attention and concentration deficit: Secondary | ICD-10-CM

## 2021-04-11 MED ORDER — BUPROPION HCL ER (XL) 150 MG PO TB24: ORAL_TABLET | ORAL | 1 refills | Status: DC

## 2021-04-11 MED ORDER — AMPHETAMINE-DEXTROAMPHETAMINE 20 MG PO TABS: 30.0000 mg | ORAL_TABLET | Freq: Two times a day (BID) | ORAL | 0 refills | Status: DC

## 2021-05-20 ENCOUNTER — Ambulatory Visit (INDEPENDENT_AMBULATORY_CARE_PROVIDER_SITE_OTHER): Payer: BC Managed Care – PPO | Admitting: Nurse Practitioner

## 2021-05-20 ENCOUNTER — Encounter: Payer: Self-pay | Admitting: Nurse Practitioner

## 2021-05-20 ENCOUNTER — Other Ambulatory Visit: Payer: Self-pay

## 2021-05-20 VITALS — BP 132/80 | HR 65 | Temp 98.6°F | Ht 66.0 in | Wt 149.5 lb

## 2021-05-20 DIAGNOSIS — R519 Headache, unspecified: Secondary | ICD-10-CM | POA: Diagnosis not present

## 2021-05-20 DIAGNOSIS — R41840 Attention and concentration deficit: Secondary | ICD-10-CM

## 2021-05-20 DIAGNOSIS — I10 Essential (primary) hypertension: Secondary | ICD-10-CM

## 2021-05-20 DIAGNOSIS — M5432 Sciatica, left side: Secondary | ICD-10-CM

## 2021-05-20 DIAGNOSIS — F411 Generalized anxiety disorder: Secondary | ICD-10-CM

## 2021-05-20 DIAGNOSIS — E559 Vitamin D deficiency, unspecified: Secondary | ICD-10-CM | POA: Diagnosis not present

## 2021-05-20 MED ORDER — PROPRANOLOL HCL ER 60 MG PO CP24: 60.0000 mg | ORAL_CAPSULE | Freq: Every day | ORAL | 1 refills | Status: DC

## 2021-05-20 MED ORDER — AMPHETAMINE-DEXTROAMPHETAMINE 15 MG PO TABS: 30.0000 mg | ORAL_TABLET | Freq: Two times a day (BID) | ORAL | 0 refills | Status: DC

## 2021-05-20 MED ORDER — HYDROCHLOROTHIAZIDE 25 MG PO TABS: ORAL_TABLET | ORAL | 1 refills | Status: DC

## 2021-05-20 MED ORDER — UBRELVY 100 MG PO TABS: 1.0000 | ORAL_TABLET | Freq: Two times a day (BID) | ORAL | 3 refills | Status: DC | PRN

## 2021-05-20 MED ORDER — BUPROPION HCL ER (XL) 150 MG PO TB24: ORAL_TABLET | ORAL | 1 refills | Status: DC

## 2021-05-20 MED ORDER — ERGOCALCIFEROL 1.25 MG (50000 UT) PO CAPS: 50000.0000 [IU] | ORAL_CAPSULE | ORAL | 5 refills | Status: DC

## 2021-05-20 MED ORDER — IBUPROFEN 600 MG PO TABS: 600.0000 mg | ORAL_TABLET | Freq: Three times a day (TID) | ORAL | 1 refills | Status: DC | PRN

## 2021-05-20 MED ORDER — ESCITALOPRAM OXALATE 10 MG PO TABS: 10.0000 mg | ORAL_TABLET | Freq: Every day | ORAL | 1 refills | Status: DC

## 2021-05-20 NOTE — Progress Notes (Signed)
Established patient visit   Patient: Jillian Edwards   DOB: 13-Nov-1965   56 y.o. Female  MRN: [id number] Visit Date: 05/20/2021   Chief Complaint  Patient presents with   Follow-up   Hypertension   Subjective    HPI  The patient is here for routine follow up. Blood pressure is good today.  -improved depression since most recent visit. Recently had a break up. Is getting back on her feet. Got her own house. Has been taking her alprazolam a little more often. Has not needed a refill in some time and still does not need a refill. Continues to take lexapro 10mg  and bupropion 150mg  daily. She does take adderall 30mg  twice daily. There has been a great deal of trouble getting this dose filled at the pharmacy. Pharmacies have had trouble getting adderall 20 and 30mg  tablets. Will change her adderall to 15mg , taking up to 2 tablets twice daily as needed. I did review her PDMP profile today. Her overdose risk score is 110. The last time she had adderall filled was 04/11/2021. She does have a non-opioid controlled substances agreement on file. She does need to have refills of this today.  -she has no further concerns or complaints today.    Medications: Outpatient Medications Prior to Visit  Medication Sig   ALPRAZolam (XANAX) 0.5 MG tablet Take 1 tablet (0.5 mg total) by mouth 2 (two) times daily as needed for anxiety.   aspirin EC 81 MG tablet Take 81 mg by mouth daily.   azelastine (ASTELIN) 0.1 % nasal spray Place 1 spray into both nostrils 2 (two) times daily. Use in each nostril as directed   calcipotriene (DOVONOX) 0.005 % cream Apply topically 3 (three) times a week.   cholecalciferol (VITAMIN D) 1000 units tablet Take 2,000 Units by mouth daily.    clotrimazole-betamethasone (LOTRISONE) cream Apply 1 application topically 2 (two) times daily.   doxycycline (PERIOSTAT) 20 MG tablet TAKE 1 TABLET BY MOUTH TWICE DAILY . *TAKE WITH FOOD*   Ivermectin 1 % CREA    mometasone (ELOCON) 0.1 %  cream Apply 1 application topically as needed.   montelukast (SINGULAIR) 10 MG tablet Take 1 tablet (10 mg total) by mouth at bedtime.   tacrolimus (PROTOPIC) 0.1 % ointment APPLY TOPICALLY IN THE MORNING AND AT BEDTIME.   vitamin E 180 MG (400 UNITS) capsule Take 1 capsule by mouth daily.   [DISCONTINUED] amoxicillin (AMOXIL) 875 MG tablet Take 1 tablet (875 mg total) by mouth 2 (two) times daily.   [DISCONTINUED] amphetamine-dextroamphetamine (ADDERALL) 20 MG tablet Take 1.5 tablets (30 mg total) by mouth 2 (two) times daily.   [DISCONTINUED] buPROPion (WELLBUTRIN XL) 150 MG 24 hr tablet Take 1 tablet po QD   [DISCONTINUED] escitalopram (LEXAPRO) 10 MG tablet Take 1 tablet (10 mg total) by mouth daily.   [DISCONTINUED] hydrochlorothiazide (HYDRODIURIL) 25 MG tablet TAKE 1 TABLET BY MOUTH ONCE EVERY MORNING AND 1/2 TABLET ONCE EVERY EVENING AS NEEDED   [DISCONTINUED] propranolol ER (INDERAL LA) 60 MG 24 hr capsule Take 1 capsule (60 mg total) by mouth daily.   [DISCONTINUED] Ubrogepant (UBRELVY) 100 MG TABS Take 1 tablet by mouth 2 (two) times daily as needed. Dx: T73.220, G44.52   No facility-administered medications prior to visit.    Review of Systems  Constitutional:  Negative for activity change, appetite change, chills, fatigue and fever.  HENT:  Negative for congestion, postnasal drip, rhinorrhea, sinus pressure, sinus pain, sneezing and sore throat.   Eyes: Negative.  Respiratory:  Negative for cough, chest tightness, shortness of breath and wheezing.   Cardiovascular:  Negative for chest pain and palpitations.  Gastrointestinal:  Negative for abdominal pain, constipation, diarrhea, nausea and vomiting.  Endocrine: Negative for cold intolerance, heat intolerance, polydipsia and polyuria.  Genitourinary:  Negative for dyspareunia, dysuria, flank pain, frequency and urgency.  Musculoskeletal:  Negative for arthralgias, back pain and myalgias.  Skin:  Negative for rash.   Allergic/Immunologic: Positive for environmental allergies.  Neurological:  Positive for headaches. Negative for dizziness and weakness.       Well managed migraine headaches   Hematological:  Negative for adenopathy.  Psychiatric/Behavioral:  Positive for dysphoric mood. The patient is nervous/anxious.       Objective     Today's Vitals   05/20/21 0911  BP: 132/80  Pulse: 65  SpO2: 100%  Weight: 149 lb 8 oz (67.8 kg)   Body mass index is 24.13 kg/m.   BP Readings from Last 3 Encounters:  05/20/21 132/80  02/15/21 (!) 148/87  11/15/20 132/85    Wt Readings from Last 3 Encounters:  05/20/21 149 lb 8 oz (67.8 kg)  02/15/21 155 lb 9.6 oz (70.6 kg)  11/15/20 155 lb 3.2 oz (70.4 kg)    Physical Exam Vitals and nursing note reviewed.  Constitutional:      Appearance: Normal appearance. She is well-developed.  HENT:     Head: Normocephalic and atraumatic.     Nose: Nose normal.     Mouth/Throat:     Mouth: Mucous membranes are moist.     Pharynx: Oropharynx is clear.  Eyes:     Extraocular Movements: Extraocular movements intact.     Conjunctiva/sclera: Conjunctivae normal.     Pupils: Pupils are equal, round, and reactive to light.  Cardiovascular:     Rate and Rhythm: Normal rate and regular rhythm.     Pulses: Normal pulses.     Heart sounds: Normal heart sounds.  Pulmonary:     Effort: Pulmonary effort is normal.     Breath sounds: Normal breath sounds.  Abdominal:     Palpations: Abdomen is soft.  Musculoskeletal:        General: Normal range of motion.     Cervical back: Normal range of motion and neck supple.  Lymphadenopathy:     Cervical: No cervical adenopathy.  Skin:    General: Skin is warm and dry.     Capillary Refill: Capillary refill takes less than 2 seconds.  Neurological:     General: No focal deficit present.     Mental Status: She is alert and oriented to person, place, and time.  Psychiatric:        Attention and Perception: Attention  and perception normal.        Mood and Affect: Affect normal. Mood is anxious.        Speech: Speech normal.        Behavior: Behavior normal. Behavior is cooperative.        Thought Content: Thought content normal.        Cognition and Memory: Cognition and memory normal.        Judgment: Judgment normal.     Assessment & Plan    1. Essential hypertension Stable. Continue propranolol LA 60mg  daily. Take HCTZ as needed and as prescribed. Refills provided for both today  - hydrochlorothiazide (HYDRODIURIL) 25 MG tablet; TAKE 1 TABLET BY MOUTH ONCE EVERY MORNING AND 1/2 TABLET ONCE EVERY EVENING AS NEEDED  Dispense:  45 tablet; Refill: 1 - propranolol ER (INDERAL LA) 60 MG 24 hr capsule; Take 1 capsule (60 mg total) by mouth daily.  Dispense: 90 capsule; Refill: 1  2. Intractable episodic headache, unspecified headache type May take ubrelvy as needed and as prescribed. New rx sent to her pharmacy.  - Ubrogepant (UBRELVY) 100 MG TABS; Take 1 tablet by mouth 2 (two) times daily as needed. Dx: G43.909, G44.52  Dispense: 10 tablet; Refill: 3  3. Vitamin D deficiency Continue drisdol weekly for additional 6 months  - ergocalciferol (DRISDOL) 1.25 MG (50000 UT) capsule; Take 1 capsule (50,000 Units total) by mouth once a week.  Dispense: 4 capsule; Refill: 5  4. Left sided sciatica May take ibuprofen 600mg  up to three  times daily as needed for pain/inflammation.  - ibuprofen (ADVIL) 600 MG tablet; Take 1 tablet (600 mg total) by mouth every 8 (eight) hours as needed.  Dispense: 90 tablet; Refill: 1  5. Attention and concentration deficit Some difficulty getting adderall prescriptions filled from pharmacy. Will send single 30 day prescription for adderall 30mg  twice daily to [facility] [place]. If able to get filled, will send subsequent prescriptions to this pharmacy   6. GAD (generalized anxiety disorder) In general, managed well. Continue lexapro 10mg  and wellbutrin XL 150mg  daily. New  prescriptions sent to her pharmacy today.  - buPROPion (WELLBUTRIN XL) 150 MG 24 hr tablet; Take 1 tablet po QD  Dispense: 30 tablet; Refill: 1 - escitalopram (LEXAPRO) 10 MG tablet; Take 1 tablet (10 mg total) by mouth daily.  Dispense: 90 tablet; Refill: 1 stable.    Problem List Items Addressed This Visit       Cardiovascular and Mediastinum   Essential hypertension - Primary   Relevant Medications   hydrochlorothiazide (HYDRODIURIL) 25 MG tablet   propranolol ER (INDERAL LA) 60 MG 24 hr capsule     Nervous and Auditory   Left sided sciatica   Relevant Medications   buPROPion (WELLBUTRIN XL) 150 MG 24 hr tablet   escitalopram (LEXAPRO) 10 MG tablet   ibuprofen (ADVIL) 600 MG tablet     Other   Attention and concentration deficit   GAD (generalized anxiety disorder)   Relevant Medications   buPROPion (WELLBUTRIN XL) 150 MG 24 hr tablet   escitalopram (LEXAPRO) 10 MG tablet   Intractable episodic headache   Relevant Medications   buPROPion (WELLBUTRIN XL) 150 MG 24 hr tablet   escitalopram (LEXAPRO) 10 MG tablet   propranolol ER (INDERAL LA) 60 MG 24 hr capsule   Ubrogepant (UBRELVY) 100 MG TABS   ibuprofen (ADVIL) 600 MG tablet   Vitamin D deficiency   Relevant Medications   ergocalciferol (DRISDOL) 1.25 MG (50000 UT) capsule     Return in about 3 months (around 08/17/2021) for blood pressure, ADD, mood - shingles vaccine .         Ronnell Freshwater, NP  [facility] at [facility] [phone] (phone) [phone] (fax)  [place]

## 2021-05-23 ENCOUNTER — Other Ambulatory Visit: Payer: Self-pay | Admitting: Nurse Practitioner

## 2021-05-23 DIAGNOSIS — M5432 Sciatica, left side: Secondary | ICD-10-CM | POA: Insufficient documentation

## 2021-05-23 DIAGNOSIS — R41840 Attention and concentration deficit: Secondary | ICD-10-CM

## 2021-05-23 MED ORDER — AMPHETAMINE-DEXTROAMPHETAMINE 15 MG PO TABS: 30.0000 mg | ORAL_TABLET | Freq: Two times a day (BID) | ORAL | 0 refills | Status: DC

## 2021-05-23 NOTE — Progress Notes (Signed)
Changed adderall back to 30mg  twice daily and sent [facility] [place] on [address] in [place].

## 2021-05-25 ENCOUNTER — Other Ambulatory Visit: Payer: Self-pay | Admitting: Nurse Practitioner

## 2021-05-25 DIAGNOSIS — R41840 Attention and concentration deficit: Secondary | ICD-10-CM

## 2021-05-25 MED ORDER — AMPHETAMINE-DEXTROAMPHETAMINE 30 MG PO TABS: 30.0000 mg | ORAL_TABLET | Freq: Two times a day (BID) | ORAL | 0 refills | Status: DC

## 2021-05-26 ENCOUNTER — Ambulatory Visit: Payer: BLUE CROSS/BLUE SHIELD | Admitting: Dermatology

## 2021-06-30 ENCOUNTER — Encounter: Payer: Self-pay | Admitting: Nurse Practitioner

## 2021-06-30 ENCOUNTER — Other Ambulatory Visit: Payer: Self-pay | Admitting: Nurse Practitioner

## 2021-06-30 DIAGNOSIS — R41840 Attention and concentration deficit: Secondary | ICD-10-CM

## 2021-06-30 MED ORDER — AMPHETAMINE-DEXTROAMPHETAMINE 30 MG PO TABS: 30.0000 mg | ORAL_TABLET | Freq: Two times a day (BID) | ORAL | 0 refills | Status: DC

## 2021-06-30 NOTE — Progress Notes (Signed)
Sent new prescription to [facility] for adderall '30mg'$  twice daily as needed.  ?

## 2021-08-19 ENCOUNTER — Ambulatory Visit (INDEPENDENT_AMBULATORY_CARE_PROVIDER_SITE_OTHER): Payer: BC Managed Care – PPO | Admitting: Nurse Practitioner

## 2021-08-19 ENCOUNTER — Encounter: Payer: Self-pay | Admitting: Nurse Practitioner

## 2021-08-19 VITALS — BP 126/79 | HR 70 | Temp 97.6°F | Ht 66.14 in | Wt 152.8 lb

## 2021-08-19 DIAGNOSIS — E559 Vitamin D deficiency, unspecified: Secondary | ICD-10-CM

## 2021-08-19 DIAGNOSIS — I10 Essential (primary) hypertension: Secondary | ICD-10-CM

## 2021-08-19 DIAGNOSIS — J309 Allergic rhinitis, unspecified: Secondary | ICD-10-CM

## 2021-08-19 DIAGNOSIS — F411 Generalized anxiety disorder: Secondary | ICD-10-CM | POA: Diagnosis not present

## 2021-08-19 DIAGNOSIS — R5383 Other fatigue: Secondary | ICD-10-CM

## 2021-08-19 DIAGNOSIS — R519 Headache, unspecified: Secondary | ICD-10-CM

## 2021-08-19 DIAGNOSIS — R41840 Attention and concentration deficit: Secondary | ICD-10-CM

## 2021-08-19 DIAGNOSIS — M5432 Sciatica, left side: Secondary | ICD-10-CM

## 2021-08-19 MED ORDER — UBRELVY 100 MG PO TABS: 1.0000 | ORAL_TABLET | Freq: Two times a day (BID) | ORAL | 3 refills | Status: DC | PRN

## 2021-08-19 MED ORDER — AMPHETAMINE-DEXTROAMPHETAMINE 30 MG PO TABS: 30.0000 mg | ORAL_TABLET | Freq: Two times a day (BID) | ORAL | 0 refills | Status: DC

## 2021-08-19 MED ORDER — ESCITALOPRAM OXALATE 10 MG PO TABS
10.0000 mg | ORAL_TABLET | Freq: Every day | ORAL | 1 refills | Status: DC
Start: 2021-08-19 — End: 2021-11-21

## 2021-08-19 MED ORDER — MONTELUKAST SODIUM 10 MG PO TABS: 10.0000 mg | ORAL_TABLET | Freq: Every day | ORAL | 1 refills | Status: DC

## 2021-08-19 MED ORDER — HYDROCHLOROTHIAZIDE 25 MG PO TABS: ORAL_TABLET | ORAL | 1 refills | Status: DC

## 2021-08-19 MED ORDER — ERGOCALCIFEROL 1.25 MG (50000 UT) PO CAPS: 50000.0000 [IU] | ORAL_CAPSULE | ORAL | 1 refills | Status: DC

## 2021-08-19 MED ORDER — PROPRANOLOL HCL ER 60 MG PO CP24: 60.0000 mg | ORAL_CAPSULE | Freq: Every day | ORAL | 1 refills | Status: DC

## 2021-08-19 MED ORDER — BUPROPION HCL ER (XL) 150 MG PO TB24: ORAL_TABLET | ORAL | 1 refills | Status: DC

## 2021-08-19 MED ORDER — IBUPROFEN 600 MG PO TABS: 600.0000 mg | ORAL_TABLET | Freq: Three times a day (TID) | ORAL | 1 refills | Status: DC | PRN

## 2021-08-19 NOTE — Progress Notes (Unsigned)
Established patient visit   Patient: Jillian Edwards   DOB: 21-Jul-1965   56 y.o. Female  MRN: [id number] Visit Date: 08/19/2021   Chief Complaint  Patient presents with   Hypertension   Subjective    HPI  The patient is here for follow up visit.  -blood pressure -- doing well on current medication.  -headaches are well managed. Effective treatment with [name].  -takes adderall as needed for focus/concentration.  --controlled substances policy for [facility] signed and on file 02/21/2021.  --overdose risk score is 110 --last fill of adderall was 08/03/2021 with appropriate fill history.  -she does suffer anxiety and depression. Has been historically well managed.  -got first Shingles vaccine 08/15/2021. Will be due for the 2nd one 09/2021.     Medications: Outpatient Medications Prior to Visit  Medication Sig   ALPRAZolam (XANAX) 0.5 MG tablet Take 1 tablet (0.5 mg total) by mouth 2 (two) times daily as needed for anxiety.   aspirin EC 81 MG tablet Take 81 mg by mouth daily.   azelastine (ASTELIN) 0.1 % nasal spray Place 1 spray into both nostrils 2 (two) times daily. Use in each nostril as directed   calcipotriene (DOVONOX) 0.005 % cream Apply topically 3 (three) times a week.   cholecalciferol (VITAMIN D) 1000 units tablet Take 2,000 Units by mouth daily.    clotrimazole-betamethasone (LOTRISONE) cream Apply 1 application topically 2 (two) times daily.   doxycycline (PERIOSTAT) 20 MG tablet TAKE 1 TABLET BY MOUTH TWICE DAILY . *TAKE WITH FOOD*   Ivermectin 1 % CREA    mometasone (ELOCON) 0.1 % cream Apply 1 application topically as needed.   tacrolimus (PROTOPIC) 0.1 % ointment APPLY TOPICALLY IN THE MORNING AND AT BEDTIME.   vitamin E 180 MG (400 UNITS) capsule Take 1 capsule by mouth daily.   [DISCONTINUED] amphetamine-dextroamphetamine (ADDERALL) 30 MG tablet Take 1 tablet by mouth 2 (two) times daily.   [DISCONTINUED] buPROPion (WELLBUTRIN XL) 150 MG 24 hr tablet Take 1 tablet  po QD   [DISCONTINUED] ergocalciferol (DRISDOL) 1.25 MG (50000 UT) capsule Take 1 capsule (50,000 Units total) by mouth once a week.   [DISCONTINUED] escitalopram (LEXAPRO) 10 MG tablet Take 1 tablet (10 mg total) by mouth daily.   [DISCONTINUED] hydrochlorothiazide (HYDRODIURIL) 25 MG tablet TAKE 1 TABLET BY MOUTH ONCE EVERY MORNING AND 1/2 TABLET ONCE EVERY EVENING AS NEEDED   [DISCONTINUED] ibuprofen (ADVIL) 600 MG tablet Take 1 tablet (600 mg total) by mouth every 8 (eight) hours as needed.   [DISCONTINUED] montelukast (SINGULAIR) 10 MG tablet Take 1 tablet (10 mg total) by mouth at bedtime.   [DISCONTINUED] propranolol ER (INDERAL LA) 60 MG 24 hr capsule Take 1 capsule (60 mg total) by mouth daily.   [DISCONTINUED] Ubrogepant (UBRELVY) 100 MG TABS Take 1 tablet by mouth 2 (two) times daily as needed. Dx: N82.956, G44.52   No facility-administered medications prior to visit.    Review of Systems  Constitutional:  Positive for fatigue. Negative for activity change, appetite change, chills and fever.       Increased stress lebel due to work   HENT:  Negative for congestion, postnasal drip, rhinorrhea, sinus pressure, sinus pain, sneezing and sore throat.   Eyes: Negative.   Respiratory:  Negative for cough, chest tightness, shortness of breath and wheezing.   Cardiovascular:  Negative for chest pain and palpitations.       Well managed blood pressure.   Gastrointestinal:  Negative for abdominal pain, constipation, diarrhea, nausea and  vomiting.  Endocrine: Negative for cold intolerance, heat intolerance, polydipsia and polyuria.  Genitourinary:  Negative for dyspareunia, dysuria, flank pain, frequency and urgency.  Musculoskeletal:  Negative for arthralgias, back pain and myalgias.  Skin:  Negative for rash.  Allergic/Immunologic: Positive for environmental allergies.  Neurological:  Positive for headaches. Negative for dizziness and weakness.  Hematological:  Negative for adenopathy.   Psychiatric/Behavioral:  Positive for dysphoric mood. The patient is nervous/anxious.       Objective     Today's Vitals   08/19/21 0909  BP: 126/79  Pulse: 70  Temp: 97.6 F (36.4 C)  SpO2: 100%  Weight: 152 lb 12.8 oz (69.3 kg)  Height: 5' 6.14" (1.68 m)   Body mass index is 24.56 kg/m.   BP Readings from Last 3 Encounters:  08/19/21 126/79  05/20/21 132/80  02/15/21 (!) 148/87    Wt Readings from Last 3 Encounters:  08/19/21 152 lb 12.8 oz (69.3 kg)  05/20/21 149 lb 8 oz (67.8 kg)  02/15/21 155 lb 9.6 oz (70.6 kg)    Physical Exam Vitals and nursing note reviewed.  Constitutional:      Appearance: Normal appearance. She is well-developed.  HENT:     Head: Normocephalic and atraumatic.  Eyes:     Pupils: Pupils are equal, round, and reactive to light.  Cardiovascular:     Rate and Rhythm: Normal rate and regular rhythm.     Pulses: Normal pulses.     Heart sounds: Normal heart sounds.  Pulmonary:     Effort: Pulmonary effort is normal.     Breath sounds: Normal breath sounds.  Abdominal:     Palpations: Abdomen is soft.  Musculoskeletal:        General: Normal range of motion.     Cervical back: Normal range of motion and neck supple.  Lymphadenopathy:     Cervical: No cervical adenopathy.  Skin:    General: Skin is warm and dry.     Capillary Refill: Capillary refill takes less than 2 seconds.  Neurological:     General: No focal deficit present.     Mental Status: She is alert and oriented to person, place, and time.  Psychiatric:        Attention and Perception: Attention and perception normal.        Mood and Affect: Affect normal. Mood is anxious.        Speech: Speech normal.        Behavior: Behavior normal. Behavior is cooperative.        Thought Content: Thought content normal.        Cognition and Memory: Cognition and memory normal.        Judgment: Judgment normal.      Assessment & Plan    1. Essential hypertension Blood pressure  stable.  Continue propranolol and hydrochlorothiazide as prescribed.  Refills sent to the pharmacy. - hydrochlorothiazide (HYDRODIURIL) 25 MG tablet; TAKE 1 TABLET BY MOUTH ONCE EVERY MORNING AND 1/2 TABLET ONCE EVERY EVENING AS NEEDED  Dispense: 135 tablet; Refill: 1 - propranolol ER (INDERAL LA) 60 MG 24 hr capsule; Take 1 capsule (60 mg total) by mouth daily.  Dispense: 90 capsule; Refill: 1  2. Intractable episodic headache, unspecified headache type May take Ubrelvy as needed and as prescribed for acute migraines. - Ubrogepant (UBRELVY) 100 MG TABS; Take 1 tablet by mouth 2 (two) times daily as needed. Dx: G43.909, G44.52  Dispense: 10 tablet; Refill: 3  3. Chronic allergic rhinitis  Continue all allergy medications including Singulair as prescribed. - montelukast (SINGULAIR) 10 MG tablet; Take 1 tablet (10 mg total) by mouth at bedtime.  Dispense: 90 tablet; Refill: 1  4. GAD (generalized anxiety disorder) Stable.  Continue Lexapro 10 mg daily.  Continue Wellbutrin XL 150 mg daily.  Refills provided for both today. - buPROPion (WELLBUTRIN XL) 150 MG 24 hr tablet; Take 1 tablet po QD  Dispense: 90 tablet; Refill: 1 - escitalopram (LEXAPRO) 10 MG tablet; Take 1 tablet (10 mg total) by mouth daily.  Dispense: 90 tablet; Refill: 1  5. Left sided sciatica May take ibuprofen 800 mg up to 3 times daily as needed for acute pain.  Refill sent to her pharmacy. - ibuprofen (ADVIL) 600 MG tablet; Take 1 tablet (600 mg total) by mouth every 8 (eight) hours as needed.  Dispense: 90 tablet; Refill: 1  6. Attention and concentration deficit Patient may continue to take Adderall 30 mg twice daily when needed.  Three 30-day prescriptions were sent to her pharmacy.  Dates are 08/19/2021, 09/17/2021, and 10/15/2021. - amphetamine-dextroamphetamine (ADDERALL) 30 MG tablet; Take 1 tablet by mouth 2 (two) times daily.  Dispense: 60 tablet; Refill: 0  7. Vitamin D deficiency We will continue Drisdol 50,000 IU  weekly. - ergocalciferol (DRISDOL) 1.25 MG (50000 UT) capsule; Take 1 capsule (50,000 Units total) by mouth once a week.  Dispense: 12 capsule; Refill: 1    Problem List Items Addressed This Visit       Cardiovascular and Mediastinum   Essential hypertension - Primary   Relevant Medications   hydrochlorothiazide (HYDRODIURIL) 25 MG tablet   propranolol ER (INDERAL LA) 60 MG 24 hr capsule     Respiratory   Chronic allergic rhinitis   Relevant Medications   montelukast (SINGULAIR) 10 MG tablet     Nervous and Auditory   Left sided sciatica   Relevant Medications   buPROPion (WELLBUTRIN XL) 150 MG 24 hr tablet   escitalopram (LEXAPRO) 10 MG tablet   ibuprofen (ADVIL) 600 MG tablet   amphetamine-dextroamphetamine (ADDERALL) 30 MG tablet     Other   Attention and concentration deficit   Relevant Medications   amphetamine-dextroamphetamine (ADDERALL) 30 MG tablet   GAD (generalized anxiety disorder)   Relevant Medications   buPROPion (WELLBUTRIN XL) 150 MG 24 hr tablet   escitalopram (LEXAPRO) 10 MG tablet   Intractable episodic headache   Relevant Medications   buPROPion (WELLBUTRIN XL) 150 MG 24 hr tablet   Ubrogepant (UBRELVY) 100 MG TABS   propranolol ER (INDERAL LA) 60 MG 24 hr capsule   escitalopram (LEXAPRO) 10 MG tablet   ibuprofen (ADVIL) 600 MG tablet   Vitamin D deficiency   Relevant Medications   ergocalciferol (DRISDOL) 1.25 MG (50000 UT) capsule     Return in about 3 months (around 11/19/2021) for med refills, FBW at time of visit. 2nd shingles vaccine .         Ronnell Freshwater, NP  [facility] at [facility] [phone] (phone) [phone] (fax)  [place]

## 2021-09-09 DIAGNOSIS — Z1231 Encounter for screening mammogram for malignant neoplasm of breast: Secondary | ICD-10-CM | POA: Diagnosis not present

## 2021-10-10 ENCOUNTER — Encounter: Payer: Self-pay | Admitting: Nurse Practitioner

## 2021-10-10 DIAGNOSIS — I10 Essential (primary) hypertension: Secondary | ICD-10-CM

## 2021-10-10 MED ORDER — HYDROCHLOROTHIAZIDE 25 MG PO TABS: ORAL_TABLET | ORAL | 0 refills | Status: DC

## 2021-10-10 NOTE — Addendum Note (Signed)
Addended by: Mickel Crow on: 10/10/2021 01:38 PM   Modules accepted: Orders

## 2021-11-14 ENCOUNTER — Other Ambulatory Visit: Payer: Self-pay | Admitting: Nurse Practitioner

## 2021-11-14 DIAGNOSIS — R41840 Attention and concentration deficit: Secondary | ICD-10-CM

## 2021-11-14 NOTE — Telephone Encounter (Signed)
Refill today. Patient has appointment 11/21/2021 for additional refills.

## 2021-11-20 NOTE — Progress Notes (Unsigned)
Established patient visit   Patient: Jillian Edwards   DOB: 1965-07-09   56 y.o. Female  MRN: [id number] Visit Date: 11/21/2021   No chief complaint on file.  Subjective    HPI  Follow up visit  -hypertension - well managed.  -due to have routine, fasting labs today  -controlled episodic migraine headaches -takes adderall twice daily as needed for ADD --well managed with current dose adderall  -generalized anxiety disorder  -takes lexapro 10 mg daily takes wellbootrin XL daily    Medications: Outpatient Medications Prior to Visit  Medication Sig   ALPRAZolam (XANAX) 0.5 MG tablet Take 1 tablet (0.5 mg total) by mouth 2 (two) times daily as needed for anxiety.   amphetamine-dextroamphetamine (ADDERALL) 30 MG tablet TAKE 1 TABLET BY MOUTH TWICE DAILY   aspirin EC 81 MG tablet Take 81 mg by mouth daily.   azelastine (ASTELIN) 0.1 % nasal spray Place 1 spray into both nostrils 2 (two) times daily. Use in each nostril as directed   buPROPion (WELLBUTRIN XL) 150 MG 24 hr tablet Take 1 tablet po QD   calcipotriene (DOVONOX) 0.005 % cream Apply topically 3 (three) times a week.   cholecalciferol (VITAMIN D) 1000 units tablet Take 2,000 Units by mouth daily.    clotrimazole-betamethasone (LOTRISONE) cream Apply 1 application topically 2 (two) times daily.   doxycycline (PERIOSTAT) 20 MG tablet TAKE 1 TABLET BY MOUTH TWICE DAILY . *TAKE WITH FOOD*   ergocalciferol (DRISDOL) 1.25 MG (50000 UT) capsule Take 1 capsule (50,000 Units total) by mouth once a week.   escitalopram (LEXAPRO) 10 MG tablet Take 1 tablet (10 mg total) by mouth daily.   hydrochlorothiazide (HYDRODIURIL) 25 MG tablet TAKE 1 TABLET BY MOUTH ONCE EVERY MORNING AND 1/2 TABLET ONCE EVERY EVENING AS NEEDED   ibuprofen (ADVIL) 600 MG tablet Take 1 tablet (600 mg total) by mouth every 8 (eight) hours as needed.   Ivermectin 1 % CREA    mometasone (ELOCON) 0.1 % cream Apply 1 application topically as needed.   montelukast  (SINGULAIR) 10 MG tablet Take 1 tablet (10 mg total) by mouth at bedtime.   propranolol ER (INDERAL LA) 60 MG 24 hr capsule Take 1 capsule (60 mg total) by mouth daily.   tacrolimus (PROTOPIC) 0.1 % ointment APPLY TOPICALLY IN THE MORNING AND AT BEDTIME.   Ubrogepant (UBRELVY) 100 MG TABS Take 1 tablet by mouth 2 (two) times daily as needed. Dx: E56.314, G44.52   vitamin E 180 MG (400 UNITS) capsule Take 1 capsule by mouth daily.   No facility-administered medications prior to visit.    Review of Systems  {Labs (Optional):23779}   Objective    There were no vitals taken for this visit. BP Readings from Last 3 Encounters:  08/19/21 126/79  05/20/21 132/80  02/15/21 (!) 148/87    Wt Readings from Last 3 Encounters:  08/19/21 152 lb 12.8 oz (69.3 kg)  05/20/21 149 lb 8 oz (67.8 kg)  02/15/21 155 lb 9.6 oz (70.6 kg)    Physical Exam  ***  No results found for any visits on 11/21/21.  Assessment & Plan     Problem List Items Addressed This Visit   None    No follow-ups on file.         Ronnell Freshwater, NP  [facility] at [facility] [phone] (phone) [phone] (fax)  [place]

## 2021-11-21 ENCOUNTER — Encounter: Payer: Self-pay | Admitting: Nurse Practitioner

## 2021-11-21 ENCOUNTER — Ambulatory Visit (INDEPENDENT_AMBULATORY_CARE_PROVIDER_SITE_OTHER): Payer: BC Managed Care – PPO | Admitting: Nurse Practitioner

## 2021-11-21 VITALS — BP 137/86 | HR 63 | Ht 66.14 in | Wt 154.4 lb

## 2021-11-21 DIAGNOSIS — J309 Allergic rhinitis, unspecified: Secondary | ICD-10-CM

## 2021-11-21 DIAGNOSIS — R519 Headache, unspecified: Secondary | ICD-10-CM

## 2021-11-21 DIAGNOSIS — Z23 Encounter for immunization: Secondary | ICD-10-CM | POA: Diagnosis not present

## 2021-11-21 DIAGNOSIS — R5383 Other fatigue: Secondary | ICD-10-CM

## 2021-11-21 DIAGNOSIS — E663 Overweight: Secondary | ICD-10-CM | POA: Diagnosis not present

## 2021-11-21 DIAGNOSIS — E559 Vitamin D deficiency, unspecified: Secondary | ICD-10-CM

## 2021-11-21 DIAGNOSIS — I10 Essential (primary) hypertension: Secondary | ICD-10-CM | POA: Diagnosis not present

## 2021-11-21 DIAGNOSIS — R41840 Attention and concentration deficit: Secondary | ICD-10-CM

## 2021-11-21 DIAGNOSIS — F411 Generalized anxiety disorder: Secondary | ICD-10-CM

## 2021-11-21 DIAGNOSIS — Z0000 Encounter for general adult medical examination without abnormal findings: Secondary | ICD-10-CM

## 2021-11-21 MED ORDER — MONTELUKAST SODIUM 10 MG PO TABS: 10.0000 mg | ORAL_TABLET | Freq: Every day | ORAL | 1 refills | Status: DC

## 2021-11-21 MED ORDER — HYDROCHLOROTHIAZIDE 25 MG PO TABS: ORAL_TABLET | ORAL | 0 refills | Status: DC

## 2021-11-21 MED ORDER — AMPHETAMINE-DEXTROAMPHETAMINE 30 MG PO TABS: 1.0000 | ORAL_TABLET | Freq: Two times a day (BID) | ORAL | 0 refills | Status: DC

## 2021-11-21 MED ORDER — ESCITALOPRAM OXALATE 10 MG PO TABS: 10.0000 mg | ORAL_TABLET | Freq: Every day | ORAL | 1 refills | Status: DC

## 2021-11-21 MED ORDER — UBRELVY 100 MG PO TABS: 1.0000 | ORAL_TABLET | Freq: Two times a day (BID) | ORAL | 3 refills | Status: DC | PRN

## 2021-11-21 MED ORDER — BUPROPION HCL ER (XL) 150 MG PO TB24: ORAL_TABLET | ORAL | 1 refills | Status: DC

## 2021-11-21 MED ORDER — PROPRANOLOL HCL ER 60 MG PO CP24: 60.0000 mg | ORAL_CAPSULE | Freq: Every day | ORAL | 1 refills | Status: DC

## 2021-11-21 MED ORDER — ERGOCALCIFEROL 1.25 MG (50000 UT) PO CAPS: 50000.0000 [IU] | ORAL_CAPSULE | ORAL | 1 refills | Status: DC

## 2021-11-30 ENCOUNTER — Encounter: Payer: Self-pay | Admitting: Nurse Practitioner

## 2021-12-01 ENCOUNTER — Other Ambulatory Visit: Payer: Self-pay | Admitting: Nurse Practitioner

## 2021-12-01 DIAGNOSIS — F411 Generalized anxiety disorder: Secondary | ICD-10-CM

## 2021-12-01 DIAGNOSIS — F331 Major depressive disorder, recurrent, moderate: Secondary | ICD-10-CM

## 2021-12-01 MED ORDER — AUVELITY 45-105 MG PO TBCR: EXTENDED_RELEASE_TABLET | ORAL | 0 refills | Status: DC

## 2022-02-15 ENCOUNTER — Encounter: Payer: Self-pay | Admitting: Nurse Practitioner

## 2022-02-22 ENCOUNTER — Other Ambulatory Visit (HOSPITAL_COMMUNITY)
Admission: RE | Admit: 2022-02-22 | Discharge: 2022-02-22 | Disposition: A | Discharge status: Home / Self Care | Payer: BC Managed Care – PPO | Source: Ambulatory Visit | Attending: Nurse Practitioner | Admitting: Nurse Practitioner

## 2022-02-22 ENCOUNTER — Ambulatory Visit (INDEPENDENT_AMBULATORY_CARE_PROVIDER_SITE_OTHER): Payer: BC Managed Care – PPO | Admitting: Nurse Practitioner

## 2022-02-22 ENCOUNTER — Encounter: Payer: Self-pay | Admitting: Nurse Practitioner

## 2022-02-22 VITALS — BP 131/82 | HR 67 | Ht 66.14 in | Wt 147.0 lb

## 2022-02-22 DIAGNOSIS — F331 Major depressive disorder, recurrent, moderate: Secondary | ICD-10-CM

## 2022-02-22 DIAGNOSIS — Z23 Encounter for immunization: Secondary | ICD-10-CM | POA: Diagnosis not present

## 2022-02-22 DIAGNOSIS — E559 Vitamin D deficiency, unspecified: Secondary | ICD-10-CM | POA: Diagnosis not present

## 2022-02-22 DIAGNOSIS — R5383 Other fatigue: Secondary | ICD-10-CM

## 2022-02-22 DIAGNOSIS — I10 Essential (primary) hypertension: Secondary | ICD-10-CM | POA: Diagnosis not present

## 2022-02-22 DIAGNOSIS — F411 Generalized anxiety disorder: Secondary | ICD-10-CM

## 2022-02-22 DIAGNOSIS — R519 Headache, unspecified: Secondary | ICD-10-CM | POA: Diagnosis not present

## 2022-02-22 DIAGNOSIS — R41840 Attention and concentration deficit: Secondary | ICD-10-CM

## 2022-02-22 DIAGNOSIS — Z01419 Encounter for gynecological examination (general) (routine) without abnormal findings: Secondary | ICD-10-CM | POA: Insufficient documentation

## 2022-02-22 DIAGNOSIS — J309 Allergic rhinitis, unspecified: Secondary | ICD-10-CM

## 2022-02-22 MED ORDER — ALPRAZOLAM 0.5 MG PO TABS: 0.5000 mg | ORAL_TABLET | Freq: Two times a day (BID) | ORAL | 3 refills | Status: DC | PRN

## 2022-02-22 MED ORDER — AMPHETAMINE-DEXTROAMPHETAMINE 30 MG PO TABS: 1.0000 | ORAL_TABLET | Freq: Two times a day (BID) | ORAL | 0 refills | Status: DC

## 2022-02-22 MED ORDER — ESCITALOPRAM OXALATE 10 MG PO TABS: 10.0000 mg | ORAL_TABLET | Freq: Every day | ORAL | 1 refills | Status: DC

## 2022-02-22 MED ORDER — MONTELUKAST SODIUM 10 MG PO TABS: 10.0000 mg | ORAL_TABLET | Freq: Every day | ORAL | 1 refills | Status: DC

## 2022-02-22 MED ORDER — BUPROPION HCL ER (XL) 150 MG PO TB24: ORAL_TABLET | ORAL | 1 refills | Status: DC

## 2022-02-22 MED ORDER — PROPRANOLOL HCL ER 60 MG PO CP24: 60.0000 mg | ORAL_CAPSULE | Freq: Every day | ORAL | 1 refills | Status: DC

## 2022-02-22 MED ORDER — UBRELVY 100 MG PO TABS: 1.0000 | ORAL_TABLET | Freq: Two times a day (BID) | ORAL | 3 refills | Status: DC | PRN

## 2022-02-22 MED ORDER — HYDROCHLOROTHIAZIDE 25 MG PO TABS: ORAL_TABLET | ORAL | 1 refills | Status: DC

## 2022-02-22 NOTE — Progress Notes (Signed)
Complete physical exam   Patient: Jillian Edwards   DOB: 28-Jan-1966   56 y.o. Female  MRN: [id number] Visit Date: 02/22/2022    Chief Complaint  Patient presents with   Gynecologic Exam   Annual Exam   Subjective    Jillian Edwards is a 56 y.o. female who presents today for a complete physical exam.  She reports consuming a  generally healthy  diet. Gym/ health club routine includes cardio. She generally feels well. She does have additional problems to discuss today.   HPI  Annual physical with pap  -hypertension.  --generally well managed. -gad -medication changed from wellbutrin to Auvelity BID.  --continued adderall and lexapro without changes  --PDMP reviewed 02/22/2022 --overdose risk score 000 with no red flags or state indicators.  --most recent fill for adderall 10/30/2021.   Past Medical History:  Diagnosis Date   ADD (attention deficit disorder)    Anxiety    Depression    Dysplastic nevus 11/07/2017   left lat epigastric, right mid post thigh, right lat buttocks, left sup med buttocks   Dysplastic nevus 10/14/2018   right sup lat buttock   Dysplastic nevus 04/24/2019   low back spinal, right low back 7.0 cm lat to spine   Frequent urination    Headache    Herpes genitalis    Hypertension    Stress headaches    Stroke ([place]) ~2005    loss of peripheral vision   Stroke or transient ischemic attack (TIA) diagnosed during current admission    Urological disorder    bladder has dropped   Past Surgical History:  Procedure Laterality Date   BREAST BIOPSY Left 02/06/2013   stereotactic biopsy of calcifications-benign   BREAST ENHANCEMENT SURGERY  08/2003   COLONOSCOPY WITH PROPOFOL N/A 12/14/2016   Procedure: COLONOSCOPY WITH PROPOFOL;  Surgeon: Jonathon Bellows, MD;  Location: [facility] ENDOSCOPY;  Service: Gastroenterology;  Laterality: N/A;   COLPOSCOPY  03/1998; 07/1999   2000-no lesion; 2001-CIN1   ENDOMETRIAL BIOPSY  03/2007   proliferative   Social History    Socioeconomic History   Marital status: Legally Separated    Spouse name: Not on file   Number of children: 1   Years of education: 12   Highest education level: Not on file  Occupational History   Occupation: Corporate treasurer  Tobacco Use   Smoking status: Never   Smokeless tobacco: Never  Vaping Use   Vaping Use: Never used  Substance and Sexual Activity   Alcohol use: No    Alcohol/week: 0.0 standard drinks of alcohol   Drug use: No   Sexual activity: Yes    Birth control/protection: I.U.D.    Comment: Mirena  Other Topics Concern   Not on file  Social History Narrative   Not on file   Social Determinants of Health   Financial Resource Strain: Not on file  Food Insecurity: Not on file  Transportation Needs: Not on file  Physical Activity: Not on file  Stress: Not on file  Social Connections: Not on file  Intimate Partner Violence: Not on file   Family Status  Relation Name Status   Father  Deceased   Brother Jillian Edwards Alive   MGM  Deceased   PGM  (Not Specified)   Mother  Deceased   Family History  Problem Relation Age of Onset   Hypertension Father    Diabetes Father        Has also had an amputation   Heart attack Father  Diabetes Mellitus II Father    Diabetes Mellitus II Brother    Hypertension Brother    Breast cancer Maternal Grandmother 90   Diabetes Mellitus II Paternal Grandmother    No Known Allergies  Patient Care Team: Ronnell Freshwater, NP as PCP - General (Family Medicine)   Medications: Outpatient Medications Prior to Visit  Medication Sig   aspirin EC 81 MG tablet Take 81 mg by mouth daily.   azelastine (ASTELIN) 0.1 % nasal spray Place 1 spray into both nostrils 2 (two) times daily. Use in each nostril as directed   calcipotriene (DOVONOX) 0.005 % cream Apply topically 3 (three) times a week.   cholecalciferol (VITAMIN D) 1000 units tablet Take 2,000 Units by mouth daily.    clotrimazole-betamethasone (LOTRISONE) cream  Apply 1 application topically 2 (two) times daily.   doxycycline (PERIOSTAT) 20 MG tablet TAKE 1 TABLET BY MOUTH TWICE DAILY . *TAKE WITH FOOD*   ergocalciferol (DRISDOL) 1.25 MG (50000 UT) capsule Take 1 capsule (50,000 Units total) by mouth once a week.   ibuprofen (ADVIL) 600 MG tablet Take 1 tablet (600 mg total) by mouth every 8 (eight) hours as needed.   Ivermectin 1 % CREA    mometasone (ELOCON) 0.1 % cream Apply 1 application topically as needed.   tacrolimus (PROTOPIC) 0.1 % ointment APPLY TOPICALLY IN THE MORNING AND AT BEDTIME.   vitamin E 180 MG (400 UNITS) capsule Take 1 capsule by mouth daily.   [DISCONTINUED] ALPRAZolam (XANAX) 0.5 MG tablet Take 1 tablet (0.5 mg total) by mouth 2 (two) times daily as needed for anxiety.   [DISCONTINUED] amphetamine-dextroamphetamine (ADDERALL) 30 MG tablet Take 1 tablet by mouth 2 (two) times daily.   [DISCONTINUED] buPROPion (WELLBUTRIN XL) 150 MG 24 hr tablet Take 1 tablet po QD   [DISCONTINUED] Dextromethorphan-buPROPion ER (AUVELITY) 45-105 MG TBCR Take 1 tablet po QAM for 3 days then increase to BID   [DISCONTINUED] escitalopram (LEXAPRO) 10 MG tablet Take 1 tablet (10 mg total) by mouth daily.   [DISCONTINUED] hydrochlorothiazide (HYDRODIURIL) 25 MG tablet TAKE 1 TABLET BY MOUTH ONCE EVERY MORNING AND 1/2 TABLET ONCE EVERY EVENING AS NEEDED   [DISCONTINUED] montelukast (SINGULAIR) 10 MG tablet Take 1 tablet (10 mg total) by mouth at bedtime.   [DISCONTINUED] propranolol ER (INDERAL LA) 60 MG 24 hr capsule Take 1 capsule (60 mg total) by mouth daily.   [DISCONTINUED] Ubrogepant (UBRELVY) 100 MG TABS Take 1 tablet by mouth 2 (two) times daily as needed. Dx: H47.425, G44.52   No facility-administered medications prior to visit.    Review of Systems  Constitutional:  Positive for fatigue. Negative for activity change, appetite change, chills and fever.  HENT:  Positive for congestion, postnasal drip, rhinorrhea and sinus pain. Negative for  sinus pressure, sneezing and sore throat.   Eyes: Negative.   Respiratory:  Negative for cough, chest tightness, shortness of breath and wheezing.   Cardiovascular:  Negative for chest pain and palpitations.  Gastrointestinal:  Negative for abdominal pain, constipation, diarrhea, nausea and vomiting.  Endocrine: Negative for cold intolerance, heat intolerance, polydipsia and polyuria.  Genitourinary:  Negative for dyspareunia, dysuria, flank pain, frequency and urgency.  Musculoskeletal:  Negative for arthralgias, back pain and myalgias.  Skin:  Negative for rash.  Allergic/Immunologic: Positive for environmental allergies.  Neurological:  Negative for dizziness, weakness and headaches.  Hematological:  Negative for adenopathy.  Psychiatric/Behavioral:  Positive for dysphoric mood and sleep disturbance. The patient is nervous/anxious.  Objective     Today's Vitals   02/22/22 0836  BP: 131/82  Pulse: 67  SpO2: 95%  Weight: 147 lb (66.7 kg)  Height: 5' 6.14" (1.68 m)   Body mass index is 23.63 kg/m.  BP Readings from Last 3 Encounters:  02/22/22 131/82  11/21/21 137/86  08/19/21 126/79    Wt Readings from Last 3 Encounters:  02/22/22 147 lb (66.7 kg)  11/21/21 154 lb 6.4 oz (70 kg)  08/19/21 152 lb 12.8 oz (69.3 kg)     Physical Exam Vitals and nursing note reviewed. Exam conducted with a chaperone present.  Constitutional:      Appearance: Normal appearance. She is well-developed.  HENT:     Head: Normocephalic and atraumatic.     Right Ear: Tympanic membrane, ear canal and external ear normal.     Left Ear: Tympanic membrane, ear canal and external ear normal.     Nose: Nose normal.     Mouth/Throat:     Mouth: Mucous membranes are moist.     Pharynx: Oropharynx is clear.  Eyes:     Extraocular Movements: Extraocular movements intact.     Conjunctiva/sclera: Conjunctivae normal.     Pupils: Pupils are equal, round, and reactive to light.   Cardiovascular:     Rate and Rhythm: Normal rate and regular rhythm.     Pulses: Normal pulses.     Heart sounds: Normal heart sounds.  Pulmonary:     Effort: Pulmonary effort is normal.     Breath sounds: Normal breath sounds.  Abdominal:     General: Bowel sounds are normal. There is no distension.     Palpations: Abdomen is soft. There is no mass.     Tenderness: There is no abdominal tenderness. There is no right CVA tenderness, left CVA tenderness, guarding or rebound.     Hernia: No hernia is present. There is no hernia in the left inguinal area or right inguinal area.  Genitourinary:    General: Normal vulva.     Exam position: Supine.     Labia:        Right: No rash, tenderness, lesion or injury.        Left: No rash, tenderness, lesion or injury.      Vagina: Normal. No signs of injury and foreign body. No vaginal discharge, erythema, tenderness, bleeding, lesions or prolapsed vaginal walls.     Cervix: No cervical motion tenderness, discharge, friability, lesion, erythema, cervical bleeding or eversion.     Uterus: Normal. Not deviated, not enlarged, not fixed and no uterine prolapse.      Adnexa: Right adnexa normal and left adnexa normal.     Comments: No tenderness, masses, or organomeglay present during bimanual exam .   Musculoskeletal:        General: Normal range of motion.     Cervical back: Normal range of motion and neck supple.  Lymphadenopathy:     Cervical: No cervical adenopathy.     Lower Body: No right inguinal adenopathy. No left inguinal adenopathy.  Skin:    General: Skin is warm and dry.     Capillary Refill: Capillary refill takes less than 2 seconds.  Neurological:     General: No focal deficit present.     Mental Status: She is alert and oriented to person, place, and time.  Psychiatric:        Mood and Affect: Mood normal.        Behavior: Behavior normal.  Thought Content: Thought content normal.        Judgment: Judgment normal.      Last depression screening scores   Row Labels 02/22/2022    9:43 AM 11/21/2021    9:03 AM 08/19/2021    9:50 AM  PHQ 2/9 Scores   Section Header. No data exists in this row.     PHQ - 2 Score   '1 2 2  '$ PHQ- 9 Score   '10 7 8   '$ Last fall risk screening   Row Labels 05/20/2021    9:17 AM  Fall Risk    Section Header. No data exists in this row.   Falls in the past year?   0  Number falls in past yr:   0  Injury with Fall?   0  Follow up   Falls evaluation completed   Last Audit-C alcohol use screening   Row Labels 06/04/2020    8:58 AM  Alcohol Use Disorder Test (AUDIT)   Section Header. No data exists in this row.   1. How often do you have a drink containing alcohol?   0    Assessment & Plan    1. Well woman exam Annual physical today. Pap smear obtained during today's visit  - Cytology - PAP( [facility])  2. Essential hypertension Stable. Continue propranolol and HCTZ as prescribed.  - hydrochlorothiazide (HYDRODIURIL) 25 MG tablet; TAKE 1 TABLET BY MOUTH ONCE EVERY MORNING AND 1/2 TABLET ONCE EVERY EVENING AS NEEDED  Dispense: 135 tablet; Refill: 1 - propranolol ER (INDERAL LA) 60 MG 24 hr capsule; Take 1 capsule (60 mg total) by mouth daily.  Dispense: 90 capsule; Refill: 1 - Lipid panel - TSH - T4, free - Comprehensive metabolic panel - CBC with Differential/Platelet  3. Intractable episodic headache, unspecified headache type May continue ubrelvy as needed and as prescribed  - Ubrogepant (UBRELVY) 100 MG TABS; Take 1 tablet by mouth 2 (two) times daily as needed. Dx: G43.909, G44.52  Dispense: 10 tablet; Refill: 3  4. Other fatigue Check thyroid panel and CBC for further evaluation.  - TSH - T4, free - Comprehensive metabolic panel - CBC with Differential/Platelet  5. Moderate episode of recurrent major depressive disorder (HCC) Stable. Refill current medications as needed.   6. GAD (generalized anxiety disorder) Stable. Continue lexapro and wellbutrin  daily. May take alprazolam as needed and as prescribed. New prescriptions sent to her pharmacy today.  - ALPRAZolam (XANAX) 0.5 MG tablet; Take 1 tablet (0.5 mg total) by mouth 2 (two) times daily as needed for anxiety.  Dispense: 60 tablet; Refill: 3 - buPROPion (WELLBUTRIN XL) 150 MG 24 hr tablet; Take 1 tablet po QD  Dispense: 90 tablet; Refill: 1 - escitalopram (LEXAPRO) 10 MG tablet; Take 1 tablet (10 mg total) by mouth daily.  Dispense: 90 tablet; Refill: 1  7. Attention and concentration deficit May continue to take adderall twice daily as needed. Will fill at her pharmacy when needed.   8. Chronic allergic rhinitis Add singulair daily to allergy medication regimen . - montelukast (SINGULAIR) 10 MG tablet; Take 1 tablet (10 mg total) by mouth at bedtime.  Dispense: 90 tablet; Refill: 1  9. Vitamin D deficiency Check vitamin d level and treat deficiency as indicated.  - Vitamin D 1,25 dihydroxy  10. Need for influenza vaccination Flu vaccine administered during today's visit.  - Flu Vaccine QUAD 6+ mos PF IM (Fluarix Quad PF)   Immunization History  Administered Date(s) Administered  Influenza,inj,Quad PF,6+ Mos 01/13/2019, 02/22/2022   Influenza-Unspecified 12/26/2017, 12/17/2019, 12/30/2020   PFIZER(Purple Top)SARS-COV-2 Vaccination 06/03/2019, 06/24/2019, 03/12/2020   Tdap 05/12/2019   Zoster Recombinat (Shingrix) 08/15/2021, 11/21/2021    Health Maintenance  Topic Date Due   HIV Screening  Never done   COVID-19 Vaccine (4 - 2023-24 season) 11/25/2021   MAMMOGRAM  08/06/2022   PAP SMEAR-Modifier  02/22/2025   COLONOSCOPY (Pts 45-22yr Insurance coverage will need to be confirmed)  12/15/2026   DTaP/Tdap/Td (2 - Td or Tdap) 05/11/2029   INFLUENZA VACCINE  Completed   Zoster Vaccines- Shingrix  Completed   HPV VACCINES  Aged Out   Hepatitis C Screening  Discontinued    Discussed health benefits of physical activity, and encouraged her to engage in regular exercise  appropriate for her age and condition.  Problem List Items Addressed This Visit       Cardiovascular and Mediastinum   Essential hypertension   Relevant Medications   hydrochlorothiazide (HYDRODIURIL) 25 MG tablet   propranolol ER (INDERAL LA) 60 MG 24 hr capsule     Respiratory   Chronic allergic rhinitis   Relevant Medications   montelukast (SINGULAIR) 10 MG tablet     Other   Depression   Relevant Medications   ALPRAZolam (XANAX) 0.5 MG tablet   buPROPion (WELLBUTRIN XL) 150 MG 24 hr tablet   escitalopram (LEXAPRO) 10 MG tablet   Attention and concentration deficit   GAD (generalized anxiety disorder)   Relevant Medications   ALPRAZolam (XANAX) 0.5 MG tablet   buPROPion (WELLBUTRIN XL) 150 MG 24 hr tablet   escitalopram (LEXAPRO) 10 MG tablet   Other fatigue   Intractable episodic headache   Relevant Medications   buPROPion (WELLBUTRIN XL) 150 MG 24 hr tablet   escitalopram (LEXAPRO) 10 MG tablet   propranolol ER (INDERAL LA) 60 MG 24 hr capsule   Ubrogepant (UBRELVY) 100 MG TABS   Vitamin D deficiency   Other Visit Diagnoses     Well woman exam    -  Primary   Relevant Orders   Cytology - PAP( [facility]) (Completed)   Need for influenza vaccination       Relevant Orders   Flu Vaccine QUAD 6+ mos PF IM (Fluarix Quad PF) (Completed)        Return in about 3 months (around 05/25/2022) for mood, ADD, blood pressure.        HRonnell Freshwater NP  [phone](phone) [phone](fax)  [place]

## 2022-02-23 LAB — CYTOLOGY - PAP
Comment: NEGATIVE
Diagnosis: NEGATIVE
High risk HPV: NEGATIVE

## 2022-03-01 LAB — CBC WITH DIFFERENTIAL/PLATELET
Basophils Absolute: 0 10*3/uL (ref 0.0–0.2)
Basos: 1 %
EOS (ABSOLUTE): 0.1 10*3/uL (ref 0.0–0.4)
Eos: 3 %
Hematocrit: 40 % (ref 34.0–46.6)
Hemoglobin: 14.2 g/dL (ref 11.1–15.9)
Immature Grans (Abs): 0 10*3/uL (ref 0.0–0.1)
Immature Granulocytes: 0 %
Lymphocytes Absolute: 1.5 10*3/uL (ref 0.7–3.1)
Lymphs: 39 %
MCH: 32.4 pg (ref 26.6–33.0)
MCHC: 35.5 g/dL (ref 31.5–35.7)
MCV: 91 fL (ref 79–97)
Monocytes Absolute: 0.3 10*3/uL (ref 0.1–0.9)
Monocytes: 7 %
Neutrophils Absolute: 2 10*3/uL (ref 1.4–7.0)
Neutrophils: 50 %
Platelets: 280 10*3/uL (ref 150–450)
RBC: 4.38 x10E6/uL (ref 3.77–5.28)
RDW: 10.8 % — ABNORMAL LOW (ref 11.7–15.4)
WBC: 4 10*3/uL (ref 3.4–10.8)

## 2022-03-01 LAB — COMPREHENSIVE METABOLIC PANEL
ALT: 13 IU/L (ref 0–32)
AST: 14 IU/L (ref 0–40)
Albumin/Globulin Ratio: 2.2 (ref 1.2–2.2)
Albumin: 4.7 g/dL (ref 3.8–4.9)
Alkaline Phosphatase: 103 IU/L (ref 44–121)
BUN/Creatinine Ratio: 27 — ABNORMAL HIGH (ref 9–23)
BUN: 25 mg/dL — ABNORMAL HIGH (ref 6–24)
Bilirubin Total: 0.5 mg/dL (ref 0.0–1.2)
CO2: 26 mmol/L (ref 20–29)
Calcium: 10.9 mg/dL — ABNORMAL HIGH (ref 8.7–10.2)
Chloride: 104 mmol/L (ref 96–106)
Creatinine, Ser: 0.91 mg/dL (ref 0.57–1.00)
Globulin, Total: 2.1 g/dL (ref 1.5–4.5)
Glucose: 94 mg/dL (ref 70–99)
Potassium: 4.2 mmol/L (ref 3.5–5.2)
Sodium: 140 mmol/L (ref 134–144)
Total Protein: 6.8 g/dL (ref 6.0–8.5)
eGFR: 74 mL/min/{1.73_m2} (ref >59)

## 2022-03-01 LAB — VITAMIN D 1,25 DIHYDROXY
Vitamin D 1, 25 (OH)2 Total: 68 pg/mL — ABNORMAL HIGH
Vitamin D2 1, 25 (OH)2: 37 pg/mL
Vitamin D3 1, 25 (OH)2: 31 pg/mL

## 2022-03-01 LAB — LIPID PANEL
Chol/HDL Ratio: 2.3 ratio (ref 0.0–4.4)
Cholesterol, Total: 180 mg/dL (ref 100–199)
HDL: 80 mg/dL (ref >39)
LDL Chol Calc (NIH): 92 mg/dL (ref 0–99)
Triglycerides: 38 mg/dL (ref 0–149)
VLDL Cholesterol Cal: 8 mg/dL (ref 5–40)

## 2022-03-01 LAB — TSH: TSH: 1.43 u[IU]/mL (ref 0.450–4.500)

## 2022-03-01 LAB — T4, FREE: Free T4: 1.45 ng/dL (ref 0.82–1.77)

## 2022-03-07 ENCOUNTER — Other Ambulatory Visit: Payer: Self-pay | Admitting: Nurse Practitioner

## 2022-04-13 ENCOUNTER — Other Ambulatory Visit: Payer: Self-pay | Admitting: Dermatology

## 2022-05-15 ENCOUNTER — Other Ambulatory Visit: Payer: Self-pay | Admitting: Nurse Practitioner

## 2022-05-15 DIAGNOSIS — J0141 Acute recurrent pansinusitis: Secondary | ICD-10-CM

## 2022-05-15 MED ORDER — AMOXICILLIN-POT CLAVULANATE 875-125 MG PO TABS: 1.0000 | ORAL_TABLET | Freq: Two times a day (BID) | ORAL | 0 refills | Status: DC

## 2022-05-26 ENCOUNTER — Encounter: Payer: Self-pay | Admitting: Nurse Practitioner

## 2022-05-26 ENCOUNTER — Ambulatory Visit: Payer: BC Managed Care – PPO | Admitting: Nurse Practitioner

## 2022-05-26 VITALS — BP 137/84 | HR 77 | Ht 66.14 in | Wt 154.8 lb

## 2022-05-26 DIAGNOSIS — F411 Generalized anxiety disorder: Secondary | ICD-10-CM

## 2022-05-26 DIAGNOSIS — J0141 Acute recurrent pansinusitis: Secondary | ICD-10-CM

## 2022-05-26 DIAGNOSIS — R41840 Attention and concentration deficit: Secondary | ICD-10-CM

## 2022-05-26 DIAGNOSIS — I10 Essential (primary) hypertension: Secondary | ICD-10-CM | POA: Diagnosis not present

## 2022-05-26 MED ORDER — METHYLPREDNISOLONE 4 MG PO TBPK: ORAL_TABLET | ORAL | 0 refills | Status: DC

## 2022-05-26 MED ORDER — AZITHROMYCIN 250 MG PO TABS: ORAL_TABLET | ORAL | 0 refills | Status: DC

## 2022-05-26 MED ORDER — AMPHETAMINE-DEXTROAMPHETAMINE 20 MG PO TABS: 30.0000 mg | ORAL_TABLET | Freq: Two times a day (BID) | ORAL | 0 refills | Status: DC

## 2022-05-26 NOTE — Progress Notes (Signed)
Established patient visit   Patient: Jillian Edwards   DOB: Mar 14, 1966   57 y.o. Female  MRN: [id number] Visit Date: 05/26/2022   Chief Complaint  Patient presents with   Medical Management of Chronic Issues   Subjective    Sinusitis This is a recurrent problem. The current episode started in the past 7 days. The problem has been gradually worsening since onset. There has been no fever. The pain is mild. Associated symptoms include congestion, coughing, ear pain, headaches, sinus pressure, a sore throat and swollen glands. Past treatments include acetaminophen and oral decongestants. The treatment provided mild relief.    Follow up  -sinusitis - currently on antibiotics.  -has headache -has cough -still feels tired and has malaise.  -history of ADD  --PDMP profile reviewed today  --accidental overdose risk score is average at 300 with no red flags or state indicators . -fill history is appropriate.  She does need refills of her adderall today. Needs to be paper prescriptions as pharmacy network has been hacked.    Medications: Outpatient Medications Prior to Visit  Medication Sig   ALPRAZolam (XANAX) 0.5 MG tablet Take 1 tablet (0.5 mg total) by mouth 2 (two) times daily as needed for anxiety.   aspirin EC 81 MG tablet Take 81 mg by mouth daily.   azelastine (ASTELIN) 0.1 % nasal spray Place 1 spray into both nostrils 2 (two) times daily. Use in each nostril as directed   buPROPion (WELLBUTRIN XL) 150 MG 24 hr tablet Take 1 tablet po QD   calcipotriene (DOVONOX) 0.005 % cream Apply topically 3 (three) times a week.   cholecalciferol (VITAMIN D) 1000 units tablet Take 2,000 Units by mouth daily.    clotrimazole-betamethasone (LOTRISONE) cream Apply 1 application topically 2 (two) times daily.   doxycycline (PERIOSTAT) 20 MG tablet TAKE 1 TABLET BY MOUTH TWICE DAILY . *TAKE WITH FOOD*   ergocalciferol (DRISDOL) 1.25 MG (50000 UT) capsule Take 1 capsule (50,000 Units total) by  mouth once a week.   escitalopram (LEXAPRO) 10 MG tablet Take 1 tablet (10 mg total) by mouth daily.   hydrochlorothiazide (HYDRODIURIL) 25 MG tablet TAKE 1 TABLET BY MOUTH ONCE EVERY MORNING AND 1/2 TABLET ONCE EVERY EVENING AS NEEDED   ibuprofen (ADVIL) 600 MG tablet Take 1 tablet (600 mg total) by mouth every 8 (eight) hours as needed.   Ivermectin 1 % CREA    mometasone (ELOCON) 0.1 % cream Apply 1 application topically as needed.   montelukast (SINGULAIR) 10 MG tablet Take 1 tablet (10 mg total) by mouth at bedtime.   propranolol ER (INDERAL LA) 60 MG 24 hr capsule Take 1 capsule (60 mg total) by mouth daily.   tacrolimus (PROTOPIC) 0.1 % ointment APPLY TOPICALLY IN THE MORNING AND AT BEDTIME.   Ubrogepant (UBRELVY) 100 MG TABS Take 1 tablet by mouth 2 (two) times daily as needed. Dx: G43.909, G44.52   vitamin E 180 MG (400 UNITS) capsule Take 1 capsule by mouth daily.   [DISCONTINUED] amoxicillin-clavulanate (AUGMENTIN) 875-125 MG tablet Take 1 tablet by mouth 2 (two) times daily.   [DISCONTINUED] amphetamine-dextroamphetamine (ADDERALL) 20 MG tablet TAKE 1 and 1/2 TABLETS BY MOUTH TWICE DAILY   No facility-administered medications prior to visit.    Review of Systems  HENT:  Positive for congestion, ear pain, sinus pressure and sore throat.   Respiratory:  Positive for cough.   Neurological:  Positive for headaches.    Last CBC Lab Results  Component Value Date  WBC 4.0 02/22/2022   HGB 14.2 02/22/2022   HCT 40.0 02/22/2022   MCV 91 02/22/2022   MCH 32.4 02/22/2022   RDW 10.8 (L) 02/22/2022   PLT 280 02/22/2022   Last metabolic panel Lab Results  Component Value Date   GLUCOSE 94 02/22/2022   NA 140 02/22/2022   K 4.2 02/22/2022   CL 104 02/22/2022   CO2 26 02/22/2022   BUN 25 (H) 02/22/2022   CREATININE 0.91 02/22/2022   EGFR 74 02/22/2022   CALCIUM 10.9 (H) 02/22/2022   PROT 6.8 02/22/2022   ALBUMIN 4.7 02/22/2022   LABGLOB 2.1 02/22/2022   AGRATIO 2.2  02/22/2022   BILITOT 0.5 02/22/2022   ALKPHOS 103 02/22/2022   AST 14 02/22/2022   ALT 13 02/22/2022   Last lipids Lab Results  Component Value Date   CHOL 180 02/22/2022   HDL 80 02/22/2022   LDLCALC 92 02/22/2022   TRIG 38 02/22/2022   CHOLHDL 2.3 02/22/2022    Last thyroid functions Lab Results  Component Value Date   TSH 1.430 02/22/2022   Last vitamin D Lab Results  Component Value Date   VD25OH 29.7 (L) 02/15/2021   Last vitamin B12 and Folate Lab Results  Component Value Date   VITAMINB12 398 10/06/2019   FOLATE 9.4 10/06/2019       Objective     Today's Vitals   05/26/22 0903 05/26/22 0945  BP: (Abnormal) 149/81 137/84  Pulse: 77   SpO2: 99%   Weight: 154 lb 12.8 oz (70.2 kg)   Height: 5' 6.14" (1.68 m)    Body mass index is 24.88 kg/m.  BP Readings from Last 3 Encounters:  05/26/22 137/84  02/22/22 131/82  11/21/21 137/86    Wt Readings from Last 3 Encounters:  05/26/22 154 lb 12.8 oz (70.2 kg)  02/22/22 147 lb (66.7 kg)  11/21/21 154 lb 6.4 oz (70 kg)    Physical Exam Vitals and nursing note reviewed.  Constitutional:      Appearance: Normal appearance. She is well-developed.  HENT:     Head: Normocephalic and atraumatic.     Right Ear: Tympanic membrane is bulging.     Left Ear: Tympanic membrane is bulging.     Nose: Congestion and rhinorrhea present.     Right Sinus: Maxillary sinus tenderness and frontal sinus tenderness present.     Left Sinus: Maxillary sinus tenderness and frontal sinus tenderness present.     Mouth/Throat:     Mouth: Mucous membranes are moist.     Pharynx: Oropharynx is clear. Posterior oropharyngeal erythema present.  Eyes:     Extraocular Movements: Extraocular movements intact.     Conjunctiva/sclera: Conjunctivae normal.     Pupils: Pupils are equal, round, and reactive to light.  Neck:     Vascular: No carotid bruit.  Cardiovascular:     Rate and Rhythm: Normal rate and regular rhythm.     Pulses:  Normal pulses.     Heart sounds: Normal heart sounds.  Pulmonary:     Effort: Pulmonary effort is normal.     Breath sounds: Normal breath sounds.  Abdominal:     Palpations: Abdomen is soft.  Musculoskeletal:        General: Normal range of motion.     Cervical back: Normal range of motion and neck supple.  Lymphadenopathy:     Cervical: Cervical adenopathy present.  Skin:    General: Skin is warm and dry.     Capillary Refill:  Capillary refill takes less than 2 seconds.  Neurological:     General: No focal deficit present.     Mental Status: She is alert and oriented to person, place, and time.  Psychiatric:        Attention and Perception: Attention and perception normal.        Mood and Affect: Affect normal. Mood is anxious.        Speech: Speech normal.        Behavior: Behavior normal. Behavior is cooperative.        Thought Content: Thought content normal.        Cognition and Memory: Cognition and memory normal.        Judgment: Judgment normal.       Assessment & Plan     Acute recurrent pansinusitis Assessment & Plan: Start x-pack. Take as directed for 5 days. Rest and increase fluids. Continue using OTC medication to control symptoms.  Add medrol taper. Take as directed for 6 days.   Orders: -     Azithromycin; z-pack - take as directed for 5 days  Dispense: 6 tablet; Refill: 0 -     methylPREDNISolone; Take by mouth as directed for 6 days  Dispense: 21 tablet; Refill: 0  Attention and concentration deficit Assessment & Plan: May continue adderall 30 mg up to twice daily as needed. Three paper prescriptions were given to the patient to take to her pharmacy.   Orders: -     Amphetamine-Dextroamphetamine; Take 1.5 tablets (30 mg total) by mouth 2 (two) times daily.  Dispense: 90 tablet; Refill: 0  Essential hypertension Assessment & Plan: Stable. Continue current medication.    GAD (generalized anxiety disorder) Assessment & Plan: --generally well  controlled.  Continue current medication.         Return in about 3 months (around 08/26/2022) for blood pressure, ADD.         Carlean Jews, NP  [facility] at [facility] [phone] (phone) [phone] (fax)  [facility]

## 2022-06-15 DIAGNOSIS — L820 Inflamed seborrheic keratosis: Secondary | ICD-10-CM | POA: Diagnosis not present

## 2022-06-15 DIAGNOSIS — W891XXA Exposure to tanning bed, initial encounter: Secondary | ICD-10-CM | POA: Diagnosis not present

## 2022-06-15 DIAGNOSIS — R208 Other disturbances of skin sensation: Secondary | ICD-10-CM | POA: Diagnosis not present

## 2022-06-15 DIAGNOSIS — D225 Melanocytic nevi of trunk: Secondary | ICD-10-CM | POA: Diagnosis not present

## 2022-06-15 DIAGNOSIS — L538 Other specified erythematous conditions: Secondary | ICD-10-CM | POA: Diagnosis not present

## 2022-06-15 DIAGNOSIS — L2089 Other atopic dermatitis: Secondary | ICD-10-CM | POA: Diagnosis not present

## 2022-06-15 DIAGNOSIS — L578 Other skin changes due to chronic exposure to nonionizing radiation: Secondary | ICD-10-CM | POA: Diagnosis not present

## 2022-07-02 NOTE — Assessment & Plan Note (Signed)
May continue adderall 30 mg up to twice daily as needed. Three paper prescriptions were given to the patient to take to her pharmacy.

## 2022-07-02 NOTE — Assessment & Plan Note (Signed)
>>  ASSESSMENT AND PLAN FOR ATTENTION AND CONCENTRATION DEFICIT WRITTEN ON 07/02/2022  2:39 PM BY BOSCIA, HEATHER E, NP  May continue adderall 30 mg up to twice daily as needed. Three paper prescriptions were given to the patient to take to her pharmacy.

## 2022-07-02 NOTE — Assessment & Plan Note (Signed)
Start x-pack. Take as directed for 5 days. Rest and increase fluids. Continue using OTC medication to control symptoms.  Add medrol taper. Take as directed for 6 days.

## 2022-07-02 NOTE — Assessment & Plan Note (Signed)
Stable.  Continue current medication

## 2022-07-02 NOTE — Assessment & Plan Note (Signed)
--  generally well controlled.  Continue current medication.

## 2022-08-04 ENCOUNTER — Other Ambulatory Visit: Payer: Self-pay | Admitting: Nurse Practitioner

## 2022-08-04 DIAGNOSIS — R41840 Attention and concentration deficit: Secondary | ICD-10-CM

## 2022-08-04 DIAGNOSIS — E559 Vitamin D deficiency, unspecified: Secondary | ICD-10-CM

## 2022-08-29 ENCOUNTER — Ambulatory Visit: Payer: BC Managed Care – PPO | Admitting: Nurse Practitioner

## 2022-08-29 ENCOUNTER — Encounter: Payer: Self-pay | Admitting: Nurse Practitioner

## 2022-08-29 VITALS — BP 136/83 | HR 65 | Ht 66.14 in | Wt 156.1 lb

## 2022-08-29 DIAGNOSIS — J309 Allergic rhinitis, unspecified: Secondary | ICD-10-CM

## 2022-08-29 DIAGNOSIS — I10 Essential (primary) hypertension: Secondary | ICD-10-CM

## 2022-08-29 DIAGNOSIS — R519 Headache, unspecified: Secondary | ICD-10-CM | POA: Diagnosis not present

## 2022-08-29 DIAGNOSIS — F411 Generalized anxiety disorder: Secondary | ICD-10-CM

## 2022-08-29 DIAGNOSIS — R41840 Attention and concentration deficit: Secondary | ICD-10-CM | POA: Diagnosis not present

## 2022-08-29 DIAGNOSIS — M5432 Sciatica, left side: Secondary | ICD-10-CM

## 2022-08-29 MED ORDER — AMPHETAMINE-DEXTROAMPHETAMINE 20 MG PO TABS
30.0000 mg | ORAL_TABLET | Freq: Two times a day (BID) | ORAL | 0 refills | Status: DC
Start: 2022-08-29 — End: 2022-08-29

## 2022-08-29 MED ORDER — AMPHETAMINE-DEXTROAMPHETAMINE 20 MG PO TABS: 30.0000 mg | ORAL_TABLET | Freq: Two times a day (BID) | ORAL | 0 refills | Status: DC

## 2022-08-29 MED ORDER — IBUPROFEN 600 MG PO TABS
600.0000 mg | ORAL_TABLET | Freq: Three times a day (TID) | ORAL | 1 refills | Status: DC | PRN
Start: 2022-08-29 — End: 2023-05-10

## 2022-08-29 MED ORDER — UBRELVY 100 MG PO TABS
1.0000 | ORAL_TABLET | Freq: Two times a day (BID) | ORAL | 3 refills | Status: DC | PRN
Start: 2022-08-29 — End: 2023-05-16

## 2022-08-29 MED ORDER — MONTELUKAST SODIUM 10 MG PO TABS
10.0000 mg | ORAL_TABLET | Freq: Every day | ORAL | 1 refills | Status: DC
Start: 2022-08-29 — End: 2023-02-07

## 2022-08-29 MED ORDER — PROPRANOLOL HCL ER 60 MG PO CP24
60.0000 mg | ORAL_CAPSULE | Freq: Every day | ORAL | 1 refills | Status: DC
Start: 2022-08-29 — End: 2023-02-07

## 2022-08-29 MED ORDER — HYDROCHLOROTHIAZIDE 25 MG PO TABS
ORAL_TABLET | ORAL | 1 refills | Status: DC
Start: 2022-08-29 — End: 2023-02-07

## 2022-08-29 MED ORDER — ESCITALOPRAM OXALATE 10 MG PO TABS
10.0000 mg | ORAL_TABLET | Freq: Every day | ORAL | 1 refills | Status: DC
Start: 2022-08-29 — End: 2022-12-05

## 2022-08-29 MED ORDER — ALPRAZOLAM 0.5 MG PO TABS
0.5000 mg | ORAL_TABLET | Freq: Two times a day (BID) | ORAL | 3 refills | Status: DC | PRN
Start: 2022-08-29 — End: 2023-05-10

## 2022-08-29 MED ORDER — BUPROPION HCL ER (XL) 150 MG PO TB24
ORAL_TABLET | ORAL | 1 refills | Status: DC
Start: 2022-08-29 — End: 2023-02-07

## 2022-08-29 NOTE — Assessment & Plan Note (Signed)
May take ubrelvy as needed and as prescribed

## 2022-08-29 NOTE — Assessment & Plan Note (Deleted)
May continue to take adderall 20 mg up to twice daily as needed for focus and concentration -three 30 day prescription were sent to her pharmacy.  -PDMP profile reviewed today  --accidental overdose risk score is average at 260 with no red flags or state indicators . -fill history is appropriate.  

## 2022-08-29 NOTE — Progress Notes (Signed)
Established patient visit   Patient: Jillian Edwards   DOB: [birth year]   57 y.o. Female  MRN: [id number] Visit Date: 08/29/2022   Chief Complaint  Patient presents with   Medical Management of Chronic Issues   Subjective    HPI  Follow up  History of HTN -generally well controlled  GAD and ADD --PDMP profile reviewed today  --accidental overdose risk score is average at 260 with no red flags or state indicators . -fill history is appropriate.  She does need refills of her adderall today. States that she has a pimple or bump just inside the left ear.  Historically, calcium levels have been just slightly elevated  -has stopped taking oral calcium supplement and is taking vitamin d and vitamin E    Medications: Outpatient Medications Prior to Visit  Medication Sig   aspirin EC 81 MG tablet Take 81 mg by mouth daily.   azelastine (ASTELIN) 0.1 % nasal spray Place 1 spray into both nostrils 2 (two) times daily. Use in each nostril as directed   azithromycin (ZITHROMAX) 250 MG tablet z-pack - take as directed for 5 days   calcipotriene (DOVONOX) 0.005 % cream Apply topically 3 (three) times a week.   cholecalciferol (VITAMIN D) 1000 units tablet Take 2,000 Units by mouth daily.    clotrimazole-betamethasone (LOTRISONE) cream Apply 1 application topically 2 (two) times daily.   doxycycline (PERIOSTAT) 20 MG tablet TAKE 1 TABLET BY MOUTH TWICE DAILY . *TAKE WITH FOOD*   Ivermectin 1 % CREA    methylPREDNISolone (MEDROL) 4 MG TBPK tablet Take by mouth as directed for 6 days   mometasone (ELOCON) 0.1 % cream Apply 1 application topically as needed.   tacrolimus (PROTOPIC) 0.1 % ointment APPLY TOPICALLY IN THE MORNING AND AT BEDTIME.   Vitamin D, Ergocalciferol, (DRISDOL) 1.25 MG (50000 UNIT) CAPS capsule TAKE 1 CAPSULE BY MOUTH ONCE A WEEK   vitamin E 180 MG (400 UNITS) capsule Take 1 capsule by mouth daily.   [DISCONTINUED] ALPRAZolam (XANAX) 0.5 MG tablet Take 1 tablet (0.5 mg  total) by mouth 2 (two) times daily as needed for anxiety.   [DISCONTINUED] amphetamine-dextroamphetamine (ADDERALL) 20 MG tablet TAKE 1 and 1/2 TABLETS BY MOUTH TWICE DAILY   [DISCONTINUED] buPROPion (WELLBUTRIN XL) 150 MG 24 hr tablet Take 1 tablet po QD   [DISCONTINUED] escitalopram (LEXAPRO) 10 MG tablet Take 1 tablet (10 mg total) by mouth daily.   [DISCONTINUED] hydrochlorothiazide (HYDRODIURIL) 25 MG tablet TAKE 1 TABLET BY MOUTH ONCE EVERY MORNING AND 1/2 TABLET ONCE EVERY EVENING AS NEEDED   [DISCONTINUED] ibuprofen (ADVIL) 600 MG tablet Take 1 tablet (600 mg total) by mouth every 8 (eight) hours as needed.   [DISCONTINUED] montelukast (SINGULAIR) 10 MG tablet Take 1 tablet (10 mg total) by mouth at bedtime.   [DISCONTINUED] propranolol ER (INDERAL LA) 60 MG 24 hr capsule Take 1 capsule (60 mg total) by mouth daily.   [DISCONTINUED] Ubrogepant (UBRELVY) 100 MG TABS Take 1 tablet by mouth 2 (two) times daily as needed. Dx: G43.909, G44.52   No facility-administered medications prior to visit.    Review of Systems See HPI    Last CBC Lab Results  Component Value Date   WBC 4.0 02/22/2022   HGB 14.2 02/22/2022   HCT 40.0 02/22/2022   MCV 91 02/22/2022   MCH 32.4 02/22/2022   RDW 10.8 (L) 02/22/2022   PLT 280 02/22/2022   Last metabolic panel Lab Results  Component Value Date   GLUCOSE  94 02/22/2022   NA 140 02/22/2022   K 4.2 02/22/2022   CL 104 02/22/2022   CO2 26 02/22/2022   BUN 25 (H) 02/22/2022   CREATININE 0.91 02/22/2022   EGFR 74 02/22/2022   CALCIUM 10.9 (H) 02/22/2022   PROT 6.8 02/22/2022   ALBUMIN 4.7 02/22/2022   LABGLOB 2.1 02/22/2022   AGRATIO 2.2 02/22/2022   BILITOT 0.5 02/22/2022   ALKPHOS 103 02/22/2022   AST 14 02/22/2022   ALT 13 02/22/2022   Last lipids Lab Results  Component Value Date   CHOL 180 02/22/2022   HDL 80 02/22/2022   LDLCALC 92 02/22/2022   TRIG 38 02/22/2022   CHOLHDL 2.3 02/22/2022    Last thyroid functions Lab  Results  Component Value Date   TSH 1.430 02/22/2022   Last vitamin D Lab Results  Component Value Date   VD25OH 29.7 (L) 02/15/2021       Objective     Today's Vitals   08/29/22 0848 08/29/22 0903  BP: (Abnormal) 149/91 136/83  Pulse: 65   SpO2: 100%   Weight: 156 lb 1.9 oz (70.8 kg)   Height: 5' 6.14" (1.68 m)    Body mass index is 25.09 kg/m.  BP Readings from Last 3 Encounters:  08/29/22 136/83  05/26/22 137/84  02/22/22 131/82    Wt Readings from Last 3 Encounters:  08/29/22 156 lb 1.9 oz (70.8 kg)  05/26/22 154 lb 12.8 oz (70.2 kg)  02/22/22 147 lb (66.7 kg)    Physical Exam Vitals and nursing note reviewed.  Constitutional:      Appearance: Normal appearance. She is well-developed.  HENT:     Head: Normocephalic and atraumatic.     Right Ear: Tympanic membrane, ear canal and external ear normal.     Left Ear: Tympanic membrane, ear canal and external ear normal.     Nose: Nose normal.     Mouth/Throat:     Mouth: Mucous membranes are moist.     Pharynx: Oropharynx is clear.  Eyes:     Extraocular Movements: Extraocular movements intact.     Conjunctiva/sclera: Conjunctivae normal.     Pupils: Pupils are equal, round, and reactive to light.  Neck:     Vascular: No carotid bruit.  Cardiovascular:     Rate and Rhythm: Normal rate and regular rhythm.     Pulses: Normal pulses.     Heart sounds: Normal heart sounds.  Pulmonary:     Effort: Pulmonary effort is normal.     Breath sounds: Normal breath sounds.  Abdominal:     Palpations: Abdomen is soft.  Musculoskeletal:        General: Normal range of motion.     Cervical back: Normal range of motion and neck supple.  Lymphadenopathy:     Cervical: No cervical adenopathy.  Skin:    General: Skin is warm and dry.     Capillary Refill: Capillary refill takes less than 2 seconds.  Neurological:     General: No focal deficit present.     Mental Status: She is alert and oriented to person, place,  and time.  Psychiatric:        Mood and Affect: Mood normal.        Behavior: Behavior normal.        Thought Content: Thought content normal.        Judgment: Judgment normal.       Assessment & Plan    Essential hypertension Assessment & Plan:  Stable. Continue current medication.   Orders: -     hydroCHLOROthiazide; TAKE 1 TABLET BY MOUTH ONCE EVERY MORNING AND 1/2 TABLET ONCE EVERY EVENING AS NEEDED  Dispense: 135 tablet; Refill: 1 -     Propranolol HCl ER; Take 1 capsule (60 mg total) by mouth daily.  Dispense: 90 capsule; Refill: 1  Intractable episodic headache, unspecified headache type Assessment & Plan: May take ubrelvy as needed and as prescribed   Orders: Bernita Raisin; Take 1 tablet (100 mg total) by mouth 2 (two) times daily as needed. Dx: G43.909, G44.52  Dispense: 10 tablet; Refill: 3  GAD (generalized anxiety disorder) Assessment & Plan: --generally well controlled.  Continue current medication.   Orders: -     ALPRAZolam; Take 1 tablet (0.5 mg total) by mouth 2 (two) times daily as needed for anxiety.  Dispense: 60 tablet; Refill: 3 -     buPROPion HCl ER (XL); Take 1 tablet po QD  Dispense: 90 tablet; Refill: 1 -     Escitalopram Oxalate; Take 1 tablet (10 mg total) by mouth daily.  Dispense: 90 tablet; Refill: 1  Attention and concentration deficit Assessment & Plan: May continue to take adderall 20 mg up to twice daily as needed for focus and concentration -three 30 day prescription were sent to her pharmacy.  -PDMP profile reviewed today  --accidental overdose risk score is average at 260 with no red flags or state indicators . -fill history is appropriate.   Orders: -     Amphetamine-Dextroamphetamine; Take 1.5 tablets (30 mg total) by mouth 2 (two) times daily.  Dispense: 90 tablet; Refill: 0  Left sided sciatica Assessment & Plan: May take ibuprofen as needed and as prescribed. Refills provided today   Orders: -     Ibuprofen; Take 1 tablet  (600 mg total) by mouth every 8 (eight) hours as needed.  Dispense: 90 tablet; Refill: 1  Chronic allergic rhinitis Assessment & Plan: Continue allergy medication and nasal sprays as prescribed   Orders: -     Montelukast Sodium; Take 1 tablet (10 mg total) by mouth at bedtime.  Dispense: 90 tablet; Refill: 1     Return in about 3 months (around 11/29/2022) for blood pressure, ADD, med refills.         Carlean Jews, NP  [facility] [phone] (phone) [phone] (fax)  [facility]

## 2022-08-29 NOTE — Assessment & Plan Note (Signed)
--  generally well controlled.  Continue current medication.  

## 2022-08-29 NOTE — Assessment & Plan Note (Signed)
May take ibuprofen as needed and as prescribed. Refills provided today

## 2022-08-29 NOTE — Assessment & Plan Note (Signed)
>>  ASSESSMENT AND PLAN FOR ATTENTION AND CONCENTRATION DEFICIT WRITTEN ON 08/29/2022  9:41 AM BY BOSCIA, HEATHER E, NP  May continue to take adderall 20 mg up to twice daily as needed for focus and concentration -three 30 day prescription were sent to her pharmacy.  -PDMP profile reviewed today  --accidental overdose risk score is average at 260 with no red flags or state indicators . -fill history is appropriate.

## 2022-08-29 NOTE — Assessment & Plan Note (Signed)
>>  ASSESSMENT AND PLAN FOR INTRACTABLE EPISODIC HEADACHE WRITTEN ON 08/29/2022  9:38 AM BY Carlean Jews, NP  May take ubrelvy as needed and as prescribed

## 2022-08-29 NOTE — Assessment & Plan Note (Signed)
Continue allergy medication and nasal sprays as prescribed

## 2022-08-29 NOTE — Assessment & Plan Note (Signed)
Stable.  Continue current medication

## 2022-08-29 NOTE — Assessment & Plan Note (Signed)
May continue to take adderall 20 mg up to twice daily as needed for focus and concentration -three 30 day prescription were sent to her pharmacy.  -PDMP profile reviewed today  --accidental overdose risk score is average at 260 with no red flags or state indicators . -fill history is appropriate.

## 2022-11-06 ENCOUNTER — Ambulatory Visit
Admission: EM | Admit: 2022-11-06 | Discharge: 2022-11-06 | Disposition: A | Discharge status: Home / Self Care | Payer: BC Managed Care – PPO | Attending: Family Medicine | Admitting: Family Medicine

## 2022-11-06 DIAGNOSIS — J0140 Acute pansinusitis, unspecified: Secondary | ICD-10-CM

## 2022-11-06 MED ORDER — AMOXICILLIN-POT CLAVULANATE 875-125 MG PO TABS: 1.0000 | ORAL_TABLET | Freq: Two times a day (BID) | ORAL | 0 refills | Status: DC

## 2022-11-06 NOTE — ED Triage Notes (Signed)
Patient to Urgent Care with complaints of nasal congestion/ sinus pain and pressure that started July 31. Headaches. Reports the inside of her nose feels raw. Has taken multiple otc cold and flu medications/ using vaseline. Ibuprofen PTA.  Vomiting last night, reports nose bleed last night after vomiting. States she was evaluated by EMS.

## 2022-11-06 NOTE — ED Provider Notes (Signed)
Jillian Edwards    CSN: [id number] Arrival date & time: 11/06/22  1321      History   Chief Complaint Chief Complaint  Patient presents with   Nasal Congestion   Emesis    HPI Jillian Edwards is a 57 y.o. female.   HPI Patient here for evaluation of sinus congestion, nasal congestion, headache pain and pressure. She has an episode of vomiting last night and epistaxis. She called EMS due to severity of nose bleed. She has a history of recurrent sinus infection. Continues to have facial pain and pressure in addition to a frontal headache. She is also concerned about left ear pain which has been present since sinus symptoms have worsened. Past Medical History:  Diagnosis Date   ADD (attention deficit disorder)    Anxiety    Depression    Dysplastic nevus 11/07/2017   left lat epigastric, right mid post thigh, right lat buttocks, left sup med buttocks   Dysplastic nevus 10/14/2018   right sup lat buttock   Dysplastic nevus 04/24/2019   low back spinal, right low back 7.0 cm lat to spine   Frequent urination    Headache    Herpes genitalis    Hypertension    Stress headaches    Stroke (HCC) ~2005    loss of peripheral vision   Stroke or transient ischemic attack (TIA) diagnosed during current admission    Urological disorder    bladder has dropped    Patient Active Problem List   Diagnosis Date Noted   Left sided sciatica 05/23/2021   Intractable episodic headache 02/20/2021   Vitamin D deficiency 02/20/2021   Overweight with body mass index (BMI) 25.0-29.9 02/20/2021   Encounter to establish care 11/15/2020   Chronic allergic rhinitis 11/15/2020   Encounter for general adult medical examination with abnormal findings 02/03/2020   Encounter for long-term (current) use of high-risk medication 02/03/2020   Encounter for screening mammogram for malignant neoplasm of breast 02/03/2020   Dysuria 02/03/2020   Hypercalcemia 11/16/2019   Acute otitis externa of  left ear 04/26/2019   Encounter for long-term (current) use of medications 10/22/2018   Primary insomnia 10/22/2018   Migraine 07/23/2018   Chronic pansinusitis 06/17/2018   New daily persistent headache 06/03/2018   History of stroke 06/03/2018   Other fatigue 01/26/2018   Unspecified menopausal and perimenopausal disorder 01/26/2018   Acute recurrent pansinusitis 08/02/2017   Edema of both feet 08/02/2017   Attention and concentration deficit 05/04/2017   GAD (generalized anxiety disorder) 05/04/2017   Stroke [facility])    Essential hypertension    Herpes genitalis    Depression    Anxiety    ADD (attention deficit disorder)    Incontinence 08/01/2015   Microscopic hematuria 08/01/2015   Urinary frequency 08/01/2015   Cystocele, grade 2 08/01/2015    Past Surgical History:  Procedure Laterality Date   BREAST BIOPSY Left 02/06/2013   stereotactic biopsy of calcifications-benign   BREAST ENHANCEMENT SURGERY  08/2003   COLONOSCOPY WITH PROPOFOL N/A 12/14/2016   Procedure: COLONOSCOPY WITH PROPOFOL;  Surgeon: Wyline Mood, MD;  Location[facility] ENDOSCOPY;  Service: Gastroenterology;  Laterality: N/A;   COLPOSCOPY  03/1998; 07/1999   2000-no lesion; 2001-CIN1   ENDOMETRIAL BIOPSY  03/2007   proliferative    OB History     Gravida  1   Para  1   Term  1   Preterm      AB      Living  1      SAB      IAB      Ectopic      Multiple      Live Births  1            Home Medications    Prior to Admission medications   Medication Sig Start Date End Date Taking? Authorizing Provider  amoxicillin-clavulanate (AUGMENTIN) 875-125 MG tablet Take 1 tablet by mouth every 12 (twelve) hours for 10 days. 11/06/22 11/16/22 Yes Bing Neighbors, NP  ALPRAZolam Prudy Feeler) 0.5 MG tablet Take 1 tablet (0.5 mg total) by mouth 2 (two) times daily as needed for anxiety. 08/29/22   Carlean Jews, NP  amphetamine-dextroamphetamine (ADDERALL) 20 MG tablet Take 1.5 tablets (30 mg  total) by mouth 2 (two) times daily. 08/29/22   Carlean Jews, NP  aspirin EC 81 MG tablet Take 81 mg by mouth daily.    [provider]  azelastine (ASTELIN) 0.1 % nasal spray Place 1 spray into both nostrils 2 (two) times daily. Use in each nostril as directed 06/04/20   Theotis Burrow, NP  buPROPion (WELLBUTRIN XL) 150 MG 24 hr tablet Take 1 tablet po QD 08/29/22   Carlean Jews, NP  calcipotriene (DOVONOX) 0.005 % cream Apply topically 3 (three) times a week. 12/17/20   Deirdre Evener, MD  cholecalciferol (VITAMIN D) 1000 units tablet Take 2,000 Units by mouth daily.     [provider]  clotrimazole-betamethasone (LOTRISONE) cream Apply 1 application topically 2 (two) times daily. 10/06/19   Carlean Jews, NP  escitalopram (LEXAPRO) 10 MG tablet Take 1 tablet (10 mg total) by mouth daily. 08/29/22   Carlean Jews, NP  hydrochlorothiazide (HYDRODIURIL) 25 MG tablet TAKE 1 TABLET BY MOUTH ONCE EVERY MORNING AND 1/2 TABLET ONCE EVERY EVENING AS NEEDED 08/29/22   Carlean Jews, NP  ibuprofen (ADVIL) 600 MG tablet Take 1 tablet (600 mg total) by mouth every 8 (eight) hours as needed. 08/29/22   Carlean Jews, NP  Ivermectin 1 % CREA  03/24/18   [provider]  methylPREDNISolone (MEDROL) 4 MG TBPK tablet Take by mouth as directed for 6 days Patient not taking: Reported on 11/06/2022 05/26/22   Carlean Jews, NP  mometasone (ELOCON) 0.1 % cream Apply 1 application topically as needed. 04/24/19   [provider]  montelukast (SINGULAIR) 10 MG tablet Take 1 tablet (10 mg total) by mouth at bedtime. Patient not taking: Reported on 11/06/2022 08/29/22   Carlean Jews, NP  propranolol ER (INDERAL LA) 60 MG 24 hr capsule Take 1 capsule (60 mg total) by mouth daily. 08/29/22   Carlean Jews, NP  tacrolimus (PROTOPIC) 0.1 % ointment APPLY TOPICALLY IN THE MORNING AND AT BEDTIME. 12/15/19   Deirdre Evener, MD  Ubrogepant (UBRELVY) 100 MG TABS Take 1  tablet (100 mg total) by mouth 2 (two) times daily as needed. Dx: G43.909, G44.52 08/29/22   Carlean Jews, NP  Vitamin D, Ergocalciferol, (DRISDOL) 1.25 MG (50000 UNIT) CAPS capsule TAKE 1 CAPSULE BY MOUTH ONCE A WEEK 08/04/22   Saralyn Pilar A, PA  vitamin E 180 MG (400 UNITS) capsule Take 1 capsule by mouth daily. 03/19/19   [provider]    Family History Family History  Problem Relation Age of Onset   Hypertension Father    Diabetes Father        Has also had an amputation   Heart attack  Father    Diabetes Mellitus II Father    Diabetes Mellitus II Brother    Hypertension Brother    Breast cancer Maternal Grandmother 29   Diabetes Mellitus II Paternal Grandmother     Social History Social History   Tobacco Use   Smoking status: Never   Smokeless tobacco: Never  Vaping Use   Vaping status: Never Used  Substance Use Topics   Alcohol use: No    Alcohol/week: 0.0 standard drinks of alcohol   Drug use: No     Allergies   Patient has no known allergies.   Review of Systems Review of Systems Pertinent negatives listed in HPI   Physical Exam Triage Vital Signs ED Triage Vitals  Encounter Vitals Group     BP 11/06/22 1343 (!) 148/89     Systolic BP Percentile --      Diastolic BP Percentile --      Pulse Rate 11/06/22 1343 79     Resp 11/06/22 1343 20     Temp 11/06/22 1343 98 F (36.7 C)     Temp src --      SpO2 11/06/22 1343 99 %     Weight --      Height --      Head Circumference --      Peak Flow --      Pain Score 11/06/22 1337 4     Pain Loc --      Pain Education --      Exclude from Growth Chart --    No data found.  Updated Vital Signs BP (!) 148/89   Pulse 79   Temp 98 F (36.7 C)   Resp 20   LMP 05/06/2018 (Approximate)   SpO2 99%   Visual Acuity Right Eye Distance:   Left Eye Distance:   Bilateral Distance:    Right Eye Near:   Left Eye Near:    Bilateral Near:     Physical Exam General Appearance:    Alert,  cooperative, no distress  HENT:  Normocephalic,  left TM normal without fluid or infection, neck has both sides anterior cervical nodes enlarged, throat normal without erythema or exudate, Frontal and Maxillary sinus tender, and nasal mucosa congested  Eyes:    PERRL, conjunctiva/corneas clear, EOM's intact       Lungs:     Clear to auscultation bilaterally, respirations unlabored  Heart:    Regular rate and rhythm  Neurologic:   Awake, alert, oriented x 3. No apparent focal neurological           defect.         UC Treatments / Results  Labs (all labs ordered are listed, but only abnormal results are displayed) Labs Reviewed - No data to display  EKG   Radiology No results found.  Procedures Procedures (including critical care time)  Medications Ordered in UC Medications - No data to display  Initial Impression / Assessment and Plan / UC Course  I have reviewed the triage vital signs and the nursing notes.  Pertinent labs & imaging results that were available during my care of the patient were reviewed by me and considered in my medical decision making (see chart for details).    Acute pansinuitis>14 days. Treatment per discharge medication orders.  Return precautions given.  Final Clinical Impressions(s) / UC Diagnoses   Final diagnoses:  Acute non-recurrent pansinusitis   Discharge Instructions   None    ED Prescriptions  Medication Sig Dispense Auth. Provider   amoxicillin-clavulanate (AUGMENTIN) 875-125 MG tablet Take 1 tablet by mouth every 12 (twelve) hours for 10 days. 20 tablet Bing Neighbors, NP      PDMP not reviewed this encounter.   Bing Neighbors, NP 11/06/22 [phone]

## 2022-11-08 DIAGNOSIS — J324 Chronic pansinusitis: Secondary | ICD-10-CM | POA: Diagnosis not present

## 2022-11-08 DIAGNOSIS — R040 Epistaxis: Secondary | ICD-10-CM | POA: Diagnosis not present

## 2022-11-16 ENCOUNTER — Ambulatory Visit
Admission: RE | Admit: 2022-11-16 | Discharge: 2022-11-16 | Disposition: A | Discharge status: Home / Self Care | Payer: BC Managed Care – PPO | Source: Ambulatory Visit | Attending: Physician Assistant | Admitting: Physician Assistant

## 2022-11-16 VITALS — BP 165/91 | HR 77 | Temp 97.9°F | Resp 16

## 2022-11-16 DIAGNOSIS — M62830 Muscle spasm of back: Secondary | ICD-10-CM

## 2022-11-16 DIAGNOSIS — M5432 Sciatica, left side: Secondary | ICD-10-CM

## 2022-11-16 DIAGNOSIS — S39012A Strain of muscle, fascia and tendon of lower back, initial encounter: Secondary | ICD-10-CM | POA: Diagnosis not present

## 2022-11-16 MED ORDER — TIZANIDINE HCL 4 MG PO TABS: 4.0000 mg | ORAL_TABLET | Freq: Every evening | ORAL | 0 refills | Status: AC | PRN
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
## 2022-11-16 NOTE — Discharge Instructions (Addendum)
BACK PAIN: Stressed avoiding painful activities . RICE (REST, ICE, COMPRESSION, ELEVATION) guidelines reviewed. May alternate ice and heat. Consider use of muscle rubs, Salonpas patches, etc. Use medications as directed including muscle relaxers if prescribed. Take anti-inflammatory medications as prescribed or OTC NSAIDs/Tylenol.  F/u with PCP at next scheduled visit and discuss MRI since you have had this sciatic issue for a long time.  BACK PAIN RED FLAGS: If the back pain acutely worsens or there are any red flag symptoms such as numbness/tingling, leg weakness, saddle anesthesia, or loss of bowel/bladder control, go immediately to the ER. Follow up with [place] as scheduled or sooner if the pain does not begin to resolve or if it worsens before the follow up

## 2022-11-16 NOTE — ED Triage Notes (Signed)
Patient presents to [facility] for intermittent lower back pain that radiates to her left buttock x 08/19. States pain only when sitting a long time or certain movements. Not taking any OTC meds.

## 2022-12-05 ENCOUNTER — Encounter: Payer: Self-pay | Admitting: Family Medicine

## 2022-12-05 ENCOUNTER — Ambulatory Visit: Payer: BC Managed Care – PPO | Admitting: Family Medicine

## 2022-12-05 VITALS — BP 142/90 | HR 63 | Resp 20 | Ht 66.14 in | Wt 160.0 lb

## 2022-12-05 DIAGNOSIS — E559 Vitamin D deficiency, unspecified: Secondary | ICD-10-CM | POA: Diagnosis not present

## 2022-12-05 DIAGNOSIS — F988 Other specified behavioral and emotional disorders with onset usually occurring in childhood and adolescence: Secondary | ICD-10-CM

## 2022-12-05 DIAGNOSIS — E663 Overweight: Secondary | ICD-10-CM

## 2022-12-05 DIAGNOSIS — G43809 Other migraine, not intractable, without status migrainosus: Secondary | ICD-10-CM

## 2022-12-05 DIAGNOSIS — Z1159 Encounter for screening for other viral diseases: Secondary | ICD-10-CM

## 2022-12-05 DIAGNOSIS — N951 Menopausal and female climacteric states: Secondary | ICD-10-CM

## 2022-12-05 DIAGNOSIS — F321 Major depressive disorder, single episode, moderate: Secondary | ICD-10-CM

## 2022-12-05 DIAGNOSIS — F411 Generalized anxiety disorder: Secondary | ICD-10-CM

## 2022-12-05 DIAGNOSIS — M5432 Sciatica, left side: Secondary | ICD-10-CM

## 2022-12-05 DIAGNOSIS — Z23 Encounter for immunization: Secondary | ICD-10-CM | POA: Diagnosis not present

## 2022-12-05 DIAGNOSIS — I10 Essential (primary) hypertension: Secondary | ICD-10-CM | POA: Diagnosis not present

## 2022-12-05 MED ORDER — CYCLOBENZAPRINE HCL 5 MG PO TABS
ORAL_TABLET | ORAL | 1 refills | Status: DC
Start: 2022-12-05 — End: 2023-02-07

## 2022-12-05 MED ORDER — AMPHETAMINE-DEXTROAMPHETAMINE 20 MG PO TABS: 30.0000 mg | ORAL_TABLET | Freq: Two times a day (BID) | ORAL | 0 refills | Status: DC

## 2022-12-05 MED ORDER — PAROXETINE HCL 20 MG PO TABS
20.0000 mg | ORAL_TABLET | Freq: Every day | ORAL | 1 refills | Status: DC
Start: 2022-12-05 — End: 2023-02-07

## 2022-12-05 NOTE — Assessment & Plan Note (Signed)
Stable, well-controlled.  Continue Adderall 20 mg up to twice daily as needed.  PDMP reviewed, no aberrancies. Three 30 day prescriptions sent to pharmacy.

## 2022-12-05 NOTE — Assessment & Plan Note (Signed)
May continue to take Ubrelvy as needed and as prescribed as abortive medication.  She still gets migraines frequently even while on propranolol as prophylaxis.  She is interested in discussing the option of Botox injections, referral to neurology placed.

## 2022-12-05 NOTE — Patient Instructions (Signed)
ADULT OTC PAIN MEDICATIONS:  Acetaminophen (Tylenol) - Immediate-release: 325 mg to 1000 mg (1 g) orally every 4 to 6 hours - Minimum Dosing Interval: every 4 hours - Maximum Single Dose: 1000 mg - Maximum Dose: 4000 mg per 24 hours  -DO NOT TAKE IF DRINKING ALCOHOL  Ibuprofen (Advil, Midol, Motrin) -200 to 400 mg orally every 4 to 6 hours as needed -Maximum dose: 1200 mg/day (over-the-counter)  -STAY WELL HYDRATED TO REDUCE RISK OF KIDNEY DAMAGE -Increased risk of GI bleeds and kidney damage if taking too much or too often   Alternate Tylenol and ibuprofen every 4 hrs for your back pain.

## 2022-12-05 NOTE — Assessment & Plan Note (Signed)
GAD-7 score 14.  Switch Lexapro to Paxil 20 mg daily.  Continue bupropion 150 mg daily, may take Xanax 0.5 mg as needed for breakthrough anxiety.  Will continue to monitor.

## 2022-12-05 NOTE — Assessment & Plan Note (Signed)
Alternate Tylenol and ibuprofen every 4-6 hours.  Take cyclobenzaprine up to 3 times daily as needed for muscle spasms.  Recommended gentle stretching, provided handout with rehab exercises.  Apply heat at least once daily for 20 minutes.  Will continue to monitor.

## 2022-12-05 NOTE — Progress Notes (Signed)
Established Patient Office Visit  Subjective   Patient ID: Jillian Edwards, female    DOB: 21-Sep-1965  Age: 57 y.o. MRN: [id number]  Chief Complaint  Patient presents with   ADD   Hypertension    HPI Jillian Edwards is a 57 y.o. female presenting today for follow up of hypertension, ADHD.  She also has an increase in stress due to some family issues and has an increase in anxiety and depression.  She feels that Lexapro has never been effective ever since starting it, but especially now she finds that she is often tearful and overwhelmed.  She does not know what she wants to do, but would like to change her medication regimen.  She is also concerned with potential for weight gain and finds that it is difficult to lose weight after menopause. Hypertension: Pt denies chest pain, SOB, dizziness, edema, syncope, fatigue or heart palpitations. Taking hydrochlorothiazide 25 mg and propranolol 60 mg, reports excellent compliance with treatment. Denies side effects. ADHD: Reports symptoms are well controlled on Adderall 20 mg up to twice daily. Denies any problems with insomnia, chest pain, palpitations, or SOB.    ROS Negative unless otherwise noted in HPI   Objective:     BP (!) 142/90 (BP Location: Left Arm, Patient Position: Sitting, Cuff Size: Normal)   Pulse 63   Resp 20   Ht 5' 6.14" (1.68 m)   Wt 160 lb (72.6 kg)   LMP 05/06/2018 (Approximate)   SpO2 100%   BMI 25.72 kg/m   Physical Exam Constitutional:      General: She is not in acute distress.    Appearance: Normal appearance.  HENT:     Head: Normocephalic and atraumatic.  Cardiovascular:     Rate and Rhythm: Normal rate and regular rhythm.     Heart sounds: No murmur heard.    No friction rub. No gallop.  Pulmonary:     Effort: Pulmonary effort is normal. No respiratory distress.     Breath sounds: No wheezing, rhonchi or rales.  Musculoskeletal:        General: Normal range of motion.     Lumbar back: Spasms  and tenderness present. No swelling, deformity or bony tenderness. Normal range of motion.  Skin:    General: Skin is warm and dry.  Neurological:     Mental Status: She is alert and oriented to person, place, and time.     Assessment & Plan:  Essential hypertension Assessment & Plan: BP goal <140/90. Stable, typically at goal though increased in office today given stress level.  Continue hydrochlorothiazide 25 mg daily, propranolol 60 mg daily.  Repeating CMP with annual labs for medication monitoring.  Will continue to monitor.  Orders: -     CBC with Differential/Platelet; Future -     Comprehensive metabolic panel; Future  Attention deficit disorder (ADD) without hyperactivity Assessment & Plan: Stable, well-controlled.  Continue Adderall 20 mg up to twice daily as needed.  PDMP reviewed, no aberrancies. Three 30 day prescriptions sent to pharmacy.  Orders: -     CBC with Differential/Platelet; Future -     Comprehensive metabolic panel; Future  Screening for viral disease -     HIV Antibody (routine testing w rflx); Future -     Hepatitis C antibody; Future  Vitamin D deficiency -     VITAMIN D 25 Hydroxy (Vit-D Deficiency, Fractures); Future  Overweight with body mass index (BMI) 25.0-29.9 Assessment & Plan: Discussed increasing protein  and fiber.  Bupropion as augment therapy for SSRI will also help to mitigate any potential side effects such as weight gain.  Orders: -     CBC with Differential/Platelet; Future -     Comprehensive metabolic panel; Future -     Hemoglobin A1c; Future -     Lipid panel; Future -     TSH Rfx on Abnormal to Free T4; Future -     VITAMIN D 25 Hydroxy (Vit-D Deficiency, Fractures); Future  Need for influenza vaccination -     Flu vaccine trivalent PF, 6mos and older(Flulaval,Afluria,Fluarix,Fluzone)  GAD (generalized anxiety disorder) Assessment & Plan: GAD-7 score 14.  Switch Lexapro to Paxil 20 mg daily.  Continue bupropion 150 mg  daily, may take Xanax 0.5 mg as needed for breakthrough anxiety.  Will continue to monitor.  Orders: -     PARoxetine HCl; Take 1 tablet (20 mg total) by mouth daily.  Dispense: 90 tablet; Refill: 1  Current moderate episode of major depressive disorder without prior episode (HCC) Assessment & Plan: PHQ-9 score 18.  Lexapro ineffective, switch to Paxil for its additional benefit of alleviating vasomotor menopausal symptoms.  Continue bupropion 150 mg daily.  Will follow-up at annual physical and adjust as needed.  Orders: -     PARoxetine HCl; Take 1 tablet (20 mg total) by mouth daily.  Dispense: 90 tablet; Refill: 1  Vasomotor symptoms due to menopause -     PARoxetine HCl; Take 1 tablet (20 mg total) by mouth daily.  Dispense: 90 tablet; Refill: 1  Other migraine without status migrainosus, not intractable Assessment & Plan: May continue to take Ubrelvy as needed and as prescribed as abortive medication.  She still gets migraines frequently even while on propranolol as prophylaxis.  She is interested in discussing the option of Botox injections, referral to neurology placed.  Orders: -     Ambulatory referral to Neurology  Left sided sciatica Assessment & Plan: Alternate Tylenol and ibuprofen every 4-6 hours.  Take cyclobenzaprine up to 3 times daily as needed for muscle spasms.  Recommended gentle stretching, provided handout with rehab exercises.  Apply heat at least once daily for 20 minutes.  Will continue to monitor.  Orders: -     Cyclobenzaprine HCl; Take 1 tablet (5 mg total) by mouth 3 (three) times daily as needed for muscle spasms.  Dispense: 30 tablet; Refill: 1  Attention and concentration deficit -     Amphetamine-Dextroamphetamine; Take 1.5 tablets (30 mg total) by mouth 2 (two) times daily.  Dispense: 90 tablet; Refill: 0    Return in about 2 months (around 02/04/2023) for annual physical, fasting blood work 1 week before.   I spent 50 minutes on the day of the  encounter to include pre-visit record review, face-to-face time with the patient, and post visit ordering of tests and medications.  Melida Quitter, PA

## 2022-12-05 NOTE — Assessment & Plan Note (Signed)
Discussed increasing protein and fiber.  Bupropion as augment therapy for SSRI will also help to mitigate any potential side effects such as weight gain.

## 2022-12-05 NOTE — Assessment & Plan Note (Signed)
PHQ-9 score 18.  Lexapro ineffective, switch to Paxil for its additional benefit of alleviating vasomotor menopausal symptoms.  Continue bupropion 150 mg daily.  Will follow-up at annual physical and adjust as needed.

## 2022-12-05 NOTE — Assessment & Plan Note (Addendum)
BP goal <140/90. Stable, typically at goal though increased in office today given stress level.  Continue hydrochlorothiazide 25 mg daily, propranolol 60 mg daily.  Repeating CMP with annual labs for medication monitoring.  Will continue to monitor.

## 2022-12-11 ENCOUNTER — Encounter: Payer: Self-pay | Admitting: Family Medicine

## 2022-12-13 ENCOUNTER — Encounter: Payer: Self-pay | Admitting: Family Medicine

## 2022-12-13 NOTE — Progress Notes (Signed)
Patient has been monitoring blood pressure at home and sent a photograph of readings from 12/12/2022.  Patient is at goal after taking blood pressure medicine.

## 2022-12-14 ENCOUNTER — Encounter: Payer: Self-pay | Admitting: Family Medicine

## 2023-01-02 ENCOUNTER — Encounter: Payer: Self-pay | Admitting: Neurology

## 2023-01-03 DIAGNOSIS — E663 Overweight: Secondary | ICD-10-CM | POA: Diagnosis not present

## 2023-01-03 DIAGNOSIS — E559 Vitamin D deficiency, unspecified: Secondary | ICD-10-CM | POA: Diagnosis not present

## 2023-01-03 DIAGNOSIS — Z1159 Encounter for screening for other viral diseases: Secondary | ICD-10-CM | POA: Diagnosis not present

## 2023-01-03 DIAGNOSIS — I10 Essential (primary) hypertension: Secondary | ICD-10-CM | POA: Diagnosis not present

## 2023-01-04 LAB — CBC WITH DIFFERENTIAL/PLATELET
Basophils Absolute: 0 10*3/uL (ref 0.0–0.2)
Basos: 1 %
EOS (ABSOLUTE): 0.1 10*3/uL (ref 0.0–0.4)
Eos: 1 %
Hematocrit: 41.8 % (ref 34.0–46.6)
Hemoglobin: 13.9 g/dL (ref 11.1–15.9)
Immature Grans (Abs): 0 10*3/uL (ref 0.0–0.1)
Immature Granulocytes: 0 %
Lymphocytes Absolute: 1.9 10*3/uL (ref 0.7–3.1)
Lymphs: 44 %
MCH: 32.2 pg (ref 26.6–33.0)
MCHC: 33.3 g/dL (ref 31.5–35.7)
MCV: 97 fL (ref 79–97)
Monocytes Absolute: 0.3 10*3/uL (ref 0.1–0.9)
Monocytes: 7 %
Neutrophils Absolute: 2.1 10*3/uL (ref 1.4–7.0)
Neutrophils: 47 %
Platelets: 295 10*3/uL (ref 150–450)
RBC: 4.32 x10E6/uL (ref 3.77–5.28)
RDW: 11.8 % (ref 11.7–15.4)
WBC: 4.4 10*3/uL (ref 3.4–10.8)

## 2023-01-04 LAB — COMPREHENSIVE METABOLIC PANEL
ALT: 13 [IU]/L (ref 0–32)
AST: 19 [IU]/L (ref 0–40)
Albumin: 4.4 g/dL (ref 3.8–4.9)
Alkaline Phosphatase: 137 [IU]/L — ABNORMAL HIGH (ref 44–121)
BUN/Creatinine Ratio: 21 (ref 9–23)
BUN: 18 mg/dL (ref 6–24)
Bilirubin Total: 0.5 mg/dL (ref 0.0–1.2)
CO2: 27 mmol/L (ref 20–29)
Calcium: 10.9 mg/dL — ABNORMAL HIGH (ref 8.7–10.2)
Chloride: 100 mmol/L (ref 96–106)
Creatinine, Ser: 0.85 mg/dL (ref 0.57–1.00)
Globulin, Total: 2.2 g/dL (ref 1.5–4.5)
Glucose: 101 mg/dL — ABNORMAL HIGH (ref 70–99)
Potassium: 4.5 mmol/L (ref 3.5–5.2)
Sodium: 138 mmol/L (ref 134–144)
Total Protein: 6.6 g/dL (ref 6.0–8.5)
eGFR: 80 mL/min/{1.73_m2} (ref >59)

## 2023-01-04 LAB — HEPATITIS C ANTIBODY: Hep C Virus Ab: NONREACTIVE

## 2023-01-04 LAB — LIPID PANEL
Chol/HDL Ratio: 2.5 {ratio} (ref 0.0–4.4)
Cholesterol, Total: 201 mg/dL — ABNORMAL HIGH (ref 100–199)
HDL: 82 mg/dL (ref >39)
LDL Chol Calc (NIH): 109 mg/dL — ABNORMAL HIGH (ref 0–99)
Triglycerides: 52 mg/dL (ref 0–149)
VLDL Cholesterol Cal: 10 mg/dL (ref 5–40)

## 2023-01-04 LAB — TSH RFX ON ABNORMAL TO FREE T4: TSH: 1.74 u[IU]/mL (ref 0.450–4.500)

## 2023-01-04 LAB — HIV ANTIBODY (ROUTINE TESTING W REFLEX): HIV Screen 4th Generation wRfx: NONREACTIVE

## 2023-01-04 LAB — VITAMIN D 25 HYDROXY (VIT D DEFICIENCY, FRACTURES): Vit D, 25-Hydroxy: 32.9 ng/mL (ref 30.0–100.0)

## 2023-01-04 LAB — HEMOGLOBIN A1C
Est. average glucose Bld gHb Est-mCnc: 100 mg/dL
Hgb A1c MFr Bld: 5.1 % (ref 4.8–5.6)

## 2023-01-05 DIAGNOSIS — Z1231 Encounter for screening mammogram for malignant neoplasm of breast: Secondary | ICD-10-CM | POA: Diagnosis not present

## 2023-01-05 LAB — HM MAMMOGRAPHY

## 2023-01-08 ENCOUNTER — Encounter: Payer: Self-pay | Admitting: Family Medicine

## 2023-01-10 ENCOUNTER — Other Ambulatory Visit: Payer: Self-pay | Admitting: Family Medicine

## 2023-01-10 DIAGNOSIS — E559 Vitamin D deficiency, unspecified: Secondary | ICD-10-CM

## 2023-01-12 ENCOUNTER — Ambulatory Visit: Payer: BC Managed Care – PPO | Admitting: Family Medicine

## 2023-02-07 ENCOUNTER — Ambulatory Visit (INDEPENDENT_AMBULATORY_CARE_PROVIDER_SITE_OTHER): Payer: BC Managed Care – PPO | Admitting: Family Medicine

## 2023-02-07 VITALS — BP 137/85 | HR 72 | Resp 18 | Ht 66.14 in | Wt 166.0 lb

## 2023-02-07 DIAGNOSIS — Z0000 Encounter for general adult medical examination without abnormal findings: Secondary | ICD-10-CM | POA: Diagnosis not present

## 2023-02-07 DIAGNOSIS — F411 Generalized anxiety disorder: Secondary | ICD-10-CM

## 2023-02-07 DIAGNOSIS — F321 Major depressive disorder, single episode, moderate: Secondary | ICD-10-CM | POA: Diagnosis not present

## 2023-02-07 DIAGNOSIS — I10 Essential (primary) hypertension: Secondary | ICD-10-CM

## 2023-02-07 DIAGNOSIS — J309 Allergic rhinitis, unspecified: Secondary | ICD-10-CM

## 2023-02-07 DIAGNOSIS — M5432 Sciatica, left side: Secondary | ICD-10-CM

## 2023-02-07 DIAGNOSIS — F909 Attention-deficit hyperactivity disorder, unspecified type: Secondary | ICD-10-CM

## 2023-02-07 MED ORDER — GABAPENTIN 100 MG PO CAPS
100.0000 mg | ORAL_CAPSULE | Freq: Every day | ORAL | 3 refills | Status: DC
Start: 2023-02-07 — End: 2023-05-10

## 2023-02-07 MED ORDER — VALSARTAN 80 MG PO TABS: 80.0000 mg | ORAL_TABLET | Freq: Every day | ORAL | 3 refills | Status: DC

## 2023-02-07 MED ORDER — MONTELUKAST SODIUM 10 MG PO TABS: 10.0000 mg | ORAL_TABLET | Freq: Every day | ORAL | 1 refills | Status: DC

## 2023-02-07 MED ORDER — PAROXETINE HCL 30 MG PO TABS
30.0000 mg | ORAL_TABLET | Freq: Every day | ORAL | 1 refills | Status: DC
Start: 2023-02-07 — End: 2023-04-20

## 2023-02-07 MED ORDER — AMPHETAMINE-DEXTROAMPHETAMINE 20 MG PO TABS: 30.0000 mg | ORAL_TABLET | Freq: Two times a day (BID) | ORAL | 0 refills | Status: DC

## 2023-02-07 MED ORDER — PROPRANOLOL HCL ER 60 MG PO CP24: 60.0000 mg | ORAL_CAPSULE | Freq: Every day | ORAL | 1 refills | Status: DC

## 2023-02-07 MED ORDER — BUPROPION HCL ER (XL) 150 MG PO TB24: ORAL_TABLET | ORAL | 1 refills | Status: DC

## 2023-02-07 NOTE — Assessment & Plan Note (Signed)
BP goal <140/90. Stable, at goal.  Given ongoing Hypercal anemia and spite of no abnormalities in parathyroid or thyroid hormones, switch from hydrochlorothiazide 25 mg daily to valsartan 80 mg daily, continue propranolol 60 mg daily. Will continue to monitor.

## 2023-02-07 NOTE — Assessment & Plan Note (Signed)
Alternate Tylenol and ibuprofen every 4-6 hours.  Discontinue cyclobenzaprine as it has not been effective.  Trial gabapentin 100-300 g daily at bedtime as it may be more effective for nerve related pain.  Recommended to continue gentle stretching, provided handout with rehab exercises.  Apply heat at least once daily for 20 minutes.  Will continue to monitor.  Also sending referral to orthopedics for further evaluation of potential causes of worsening pain.

## 2023-02-07 NOTE — Progress Notes (Signed)
Complete physical exam  Patient: Jillian Edwards   DOB: Feb 04, 1966   57 y.o. Female  MRN: [id number]  Subjective:    Chief Complaint  Patient presents with   Annual Exam    Jillian Edwards is a 57 y.o. female who presents today for a complete physical exam. She reports consuming a general diet.  She generally feels poorly. She has been under a very high amount of stress taking care of her previous partner and offering support for her brother as he recovers from alcohol use disorder.  She feels that she has a lot of responsibility weighing on him and it is difficult to take care of herself.  She also reports worsening lower back pain and left sided sciatica.  She has been taking ibuprofen occasionally but was concerned about taking too much medication without medical guidance.  She has not been evaluated by orthopedics.  She notes that the muscle relaxer prescription that she was previously given has not offered any improvement for her symptoms.  Most recent fall risk assessment:    05/26/2022    9:05 AM  Fall Risk   Falls in the past year? 0  Number falls in past yr: 0  Injury with Fall? 0  Follow up Falls evaluation completed     Most recent depression and anxiety screenings:    02/07/2023    9:01 AM 12/05/2022    9:57 AM  PHQ 2/9 Scores  PHQ - 2 Score 4 4  PHQ- 9 Score 18 18      02/07/2023    9:01 AM 12/05/2022    9:57 AM 05/26/2022    9:05 AM 02/22/2022    9:44 AM  GAD 7 : Generalized Anxiety Score  Nervous, Anxious, on Edge 2 0 3 2  Control/stop worrying 3 3 3 2   Worry too much - different things 3 3 3 2   Trouble relaxing 3 3 3 2   Restless 3 2 2 2   Easily annoyed or irritable 3 2 3 2   Afraid - awful might happen 0 1 0 0  Total GAD 7 Score 17 14 17 12   Anxiety Difficulty Very difficult Very difficult      Patient Active Problem List   Diagnosis Date Noted   Left sided sciatica 05/23/2021   Vitamin D deficiency 02/20/2021   Overweight with body mass index  (BMI) 25.0-29.9 02/20/2021   Chronic allergic rhinitis 11/15/2020   Hypercalcemia 11/16/2019   Primary insomnia 10/22/2018   Chronic pansinusitis 06/17/2018   History of stroke 06/03/2018   Migraine 06/03/2018   Unspecified menopausal and perimenopausal disorder 01/26/2018   Edema of both feet 08/02/2017   ADD (attention deficit disorder) 05/04/2017   GAD (generalized anxiety disorder) 05/04/2017   Essential hypertension    Herpes genitalis    Depression    Incontinence 08/01/2015   Cystocele, grade 2 08/01/2015    Past Surgical History:  Procedure Laterality Date   BREAST BIOPSY Left 02/06/2013   stereotactic biopsy of calcifications-benign   BREAST ENHANCEMENT SURGERY  08/2003   COLONOSCOPY WITH PROPOFOL N/A 12/14/2016   Procedure: COLONOSCOPY WITH PROPOFOL;  Surgeon: Wyline Mood, MD;  Location: [facility] ENDOSCOPY;  Service: Gastroenterology;  Laterality: N/A;   COLPOSCOPY  03/1998; 07/1999   2000-no lesion; 2001-CIN1   ENDOMETRIAL BIOPSY  03/2007   proliferative   Social History   Tobacco Use   Smoking status: Never    Passive exposure: Never   Smokeless tobacco: Never  Vaping Use   Vaping  status: Never Used  Substance Use Topics   Alcohol use: No   Drug use: No   Family History  Problem Relation Age of Onset   Hypertension Father    Diabetes Father        Has also had an amputation   Heart attack Father    Diabetes Mellitus II Father    Diabetes Mellitus II Brother    Hypertension Brother    Breast cancer Maternal Grandmother 79   Diabetes Mellitus II Paternal Grandmother    No Known Allergies   Patient Care Team: Melida Quitter, PA as PCP - General (Family Medicine)   Outpatient Medications Prior to Visit  Medication Sig   ALPRAZolam (XANAX) 0.5 MG tablet Take 1 tablet (0.5 mg total) by mouth 2 (two) times daily as needed for anxiety.   aspirin EC 81 MG tablet Take 81 mg by mouth daily.   azelastine (ASTELIN) 0.1 % nasal spray Place 1 spray into both  nostrils 2 (two) times daily. Use in each nostril as directed   calcipotriene (DOVONOX) 0.005 % cream Apply topically 3 (three) times a week.   cholecalciferol (VITAMIN D) 1000 units tablet Take 2,000 Units by mouth daily.    ibuprofen (ADVIL) 600 MG tablet Take 1 tablet (600 mg total) by mouth every 8 (eight) hours as needed.   Ubrogepant (UBRELVY) 100 MG TABS Take 1 tablet (100 mg total) by mouth 2 (two) times daily as needed. Dx: G43.909, G44.52   Vitamin D, Ergocalciferol, (DRISDOL) 1.25 MG (50000 UNIT) CAPS capsule TAKE 1 CAPSULE BY MOUTH ONCE A WEEK   vitamin E 180 MG (400 UNITS) capsule Take 1 capsule by mouth daily.   [DISCONTINUED] amphetamine-dextroamphetamine (ADDERALL) 20 MG tablet Take 1.5 tablets (30 mg total) by mouth 2 (two) times daily.   [DISCONTINUED] buPROPion (WELLBUTRIN XL) 150 MG 24 hr tablet Take 1 tablet po QD   [DISCONTINUED] cyclobenzaprine (FLEXERIL) 5 MG tablet Take 1 tablet (5 mg total) by mouth 3 (three) times daily as needed for muscle spasms.   [DISCONTINUED] hydrochlorothiazide (HYDRODIURIL) 25 MG tablet TAKE 1 TABLET BY MOUTH ONCE EVERY MORNING AND 1/2 TABLET ONCE EVERY EVENING AS NEEDED   [DISCONTINUED] montelukast (SINGULAIR) 10 MG tablet Take 1 tablet (10 mg total) by mouth at bedtime.   [DISCONTINUED] PARoxetine (PAXIL) 20 MG tablet Take 1 tablet (20 mg total) by mouth daily.   [DISCONTINUED] propranolol ER (INDERAL LA) 60 MG 24 hr capsule Take 1 capsule (60 mg total) by mouth daily.   No facility-administered medications prior to visit.    Review of Systems  Constitutional:  Negative for chills, fever and malaise/fatigue.  HENT:  Negative for congestion and hearing loss.   Eyes:  Negative for blurred vision and double vision.  Respiratory:  Negative for cough and shortness of breath.   Cardiovascular:  Negative for chest pain, palpitations and leg swelling.  Gastrointestinal:  Negative for abdominal pain, constipation, diarrhea and heartburn.   Genitourinary:  Negative for frequency and urgency.  Musculoskeletal:  Positive for back pain and neck pain (Tension/stress of upper back and trapezius around neck). Negative for myalgias.  Neurological:  Negative for headaches.  Endo/Heme/Allergies:  Negative for polydipsia.  Psychiatric/Behavioral:  Negative for depression. The patient is nervous/anxious.       Objective:    BP 137/85 (BP Location: Left Arm, Patient Position: Sitting, Cuff Size: Normal)   Pulse 72   Resp 18   Ht 5' 6.14" (1.68 m)   Wt 166 lb (75.3  kg)   LMP 05/06/2018 (Approximate)   SpO2 99%   BMI 26.68 kg/m    Physical Exam Constitutional:      General: She is not in acute distress.    Appearance: Normal appearance.  HENT:     Head: Normocephalic and atraumatic.     Right Ear: Tympanic membrane, ear canal and external ear normal.     Left Ear: Tympanic membrane, ear canal and external ear normal.     Nose: Nose normal.     Mouth/Throat:     Mouth: Mucous membranes are moist.     Pharynx: No oropharyngeal exudate or posterior oropharyngeal erythema.  Eyes:     Extraocular Movements: Extraocular movements intact.     Conjunctiva/sclera: Conjunctivae normal.     Pupils: Pupils are equal, round, and reactive to light.  Neck:     Thyroid: No thyroid mass, thyromegaly or thyroid tenderness.  Cardiovascular:     Rate and Rhythm: Normal rate and regular rhythm.     Heart sounds: Normal heart sounds. No murmur heard.    No friction rub. No gallop.  Pulmonary:     Effort: Pulmonary effort is normal. No respiratory distress.     Breath sounds: Normal breath sounds. No wheezing, rhonchi or rales.  Abdominal:     General: Abdomen is flat. Bowel sounds are normal. There is no distension.     Palpations: There is no mass.     Tenderness: There is no abdominal tenderness. There is no guarding.  Musculoskeletal:     Cervical back: Normal range of motion and neck supple.     Right lower leg: No edema.     Left  lower leg: No edema.  Lymphadenopathy:     Cervical: No cervical adenopathy.  Skin:    General: Skin is warm and dry.  Neurological:     Mental Status: She is alert and oriented to person, place, and time.     Cranial Nerves: No cranial nerve deficit.     Motor: No weakness.     Deep Tendon Reflexes: Reflexes normal.  Psychiatric:        Mood and Affect: Mood normal.       Assessment & Plan:    Routine Health Maintenance and Physical Exam  Immunization History  Administered Date(s) Administered   Influenza, Seasonal, Injecte, Preservative Fre 12/05/2022   Influenza,inj,Quad PF,6+ Mos 01/13/2019, 02/22/2022   Influenza-Unspecified 12/26/2017, 12/17/2019, 12/30/2020   PFIZER(Purple Top)SARS-COV-2 Vaccination 06/03/2019, 06/24/2019, 03/12/2020   Tdap 05/12/2019   Zoster Recombinant(Shingrix) 08/15/2021, 11/21/2021    Health Maintenance  Topic Date Due   COVID-19 Vaccine (4 - 2023-24 season) 11/26/2022   MAMMOGRAM  01/04/2025   Colonoscopy  12/15/2026   Cervical Cancer Screening (HPV/Pap Cotest)  02/23/2027   DTaP/Tdap/Td (2 - Td or Tdap) 05/11/2029   INFLUENZA VACCINE  Completed   Hepatitis C Screening  Completed   HIV Screening  Completed   Zoster Vaccines- Shingrix  Completed   HPV VACCINES  Aged Out    Reviewed most recent labs including CBC, CMP, lipid panel, A1C, TSH, and vitamin D. All within normal limits/stable from last check other than LDL slightly elevated at 109.  Alkaline phosphatase and calcium also outside of normal limits. Alkaline phosphatase elevated 137.  Calcium elevated 10.9, corrected calcium 10.6.  Calcium has been elevated when checked for the past 3 years, alkaline phosphatase has also been intermittently elevated.  Hypercalcemia initially noted vitamin D within normal limits and PTH within normal limits.  Vitamin D remains within normal limits.  Discussed options moving forward including monitoring as long as she is asymptomatic, versus trial of  switching hydrochlorothiazide to valsartan.  Patient would like to switch medication.  Discussed health benefits of physical activity, and encouraged her to engage in regular exercise appropriate for her age and condition.  Wellness examination  Essential hypertension Assessment & Plan: BP goal <140/90. Stable, at goal.  Given ongoing Hypercal anemia and spite of no abnormalities in parathyroid or thyroid hormones, switch from hydrochlorothiazide 25 mg daily to valsartan 80 mg daily, continue propranolol 60 mg daily. Will continue to monitor.  Orders: -     Propranolol HCl ER; Take 1 capsule (60 mg total) by mouth daily.  Dispense: 90 capsule; Refill: 1 -     Valsartan; Take 1 tablet (80 mg total) by mouth daily.  Dispense: 90 tablet; Refill: 3 -     Comprehensive metabolic panel; Future  GAD (generalized anxiety disorder) Assessment & Plan: Due to elevated anxiety, increase Paxil to 30 mg daily.  We discussed that the maximum dose that we can work towards this 50 mg daily during this time of elevated stress and anxiety.  May take Xanax 0.5 mg as needed for breakthrough anxiety, does not need refill today.  Will continue to monitor.  Orders: -     buPROPion HCl ER (XL); Take 1 tablet po QD  Dispense: 90 tablet; Refill: 1 -     PARoxetine HCl; Take 1 tablet (30 mg total) by mouth daily.  Dispense: 30 tablet; Refill: 1  Current moderate episode of major depressive disorder, unspecified whether recurrent (HCC) Assessment & Plan: Increase Paxil to 30 mg daily.  Maximum daily dose 50 mg.  Continue bupropion 150 mg daily.  Will continue to monitor.  Orders: -     PARoxetine HCl; Take 1 tablet (30 mg total) by mouth daily.  Dispense: 30 tablet; Refill: 1  Attention deficit hyperactivity disorder (ADHD), unspecified ADHD type Assessment & Plan: Stable, well-controlled.  Continue Adderall 20 mg up to twice daily as needed.  In the future, may consider tapering off if needed.  PDMP reviewed, no  aberrancies.  Overdose risk score 300.  Will continue to monitor.  Orders: -     Amphetamine-Dextroamphetamine; Take 1.5 tablets (30 mg total) by mouth 2 (two) times daily.  Dispense: 90 tablet; Refill: 0  Chronic allergic rhinitis -     Montelukast Sodium; Take 1 tablet (10 mg total) by mouth at bedtime.  Dispense: 90 tablet; Refill: 1  Left sided sciatica Assessment & Plan: Alternate Tylenol and ibuprofen every 4-6 hours.  Discontinue cyclobenzaprine as it has not been effective.  Trial gabapentin 100-300 g daily at bedtime as it may be more effective for nerve related pain.  Recommended to continue gentle stretching, provided handout with rehab exercises.  Apply heat at least once daily for 20 minutes.  Will continue to monitor.  Also sending referral to orthopedics for further evaluation of potential causes of worsening pain.  Orders: -     Gabapentin; Take 1-3 capsules (100-300 mg total) by mouth at bedtime.  Dispense: 90 capsule; Refill: 3 -     Ambulatory referral to Orthopedic Surgery    Return in about 2 months (around 04/09/2023) for follow-up for mood, HTN, nonfasting labs at visit.     Melida Quitter, PA

## 2023-02-07 NOTE — Patient Instructions (Signed)
I have sent a referral to orthopedics in [place], they should be calling you to schedule once our referral coordinator sends that to within. Stop taking the muscle relaxer cyclobenzaprine.  Instead, take gabapentin.  You can start with taking 1 in the evening to see if it makes you sleepy.  It is safe to take up to 3 at once, or you can take 1 three times a day. You may also alternate taking Tylenol and ibuprofen as long as you do not exceed the total daily maximum.  I typically recommend taking Tylenol 500 mg every 4 hours or 650 mg every 6 hours with doses of ibuprofen 200 mg in between.  Stop taking hydrochlorothiazide and start taking valsartan.  Start by taking the new dose of Paxil 30 mg tablets.  If after 2-3 weeks you have not noticed much improvement, you may increase to 40 mg daily using 2 of the 20 mg tablets that you have left.  Again, after 2-3 weeks you can increase to 50 mg total by taking a 30 mg tablet and a 20 mg tablet, or you can get in touch with me to ask for 50 mg capsules.  50 mg daily is the maximum daily dose.   ADULT OTC PAIN MEDICATIONS:  Acetaminophen (Tylenol) - Immediate-release: 325 mg to 1000 mg (1 g) orally every 4 to 6 hours - Minimum Dosing Interval: every 4 hours - Maximum Single Dose: 1000 mg - Maximum Dose: 4000 mg per 24 hours  -DO NOT TAKE IF DRINKING ALCOHOL  Ibuprofen (Advil, Midol, Motrin) -200 to 400 mg orally every 4 to 6 hours as needed -Maximum dose: 1200 mg/day (over-the-counter)  -STAY WELL HYDRATED TO REDUCE RISK OF KIDNEY DAMAGE -Increased risk of GI bleeds and kidney damage if taking too much or too often

## 2023-02-07 NOTE — Assessment & Plan Note (Addendum)
Due to elevated anxiety, increase Paxil to 30 mg daily.  We discussed that the maximum dose that we can work towards this 50 mg daily during this time of elevated stress and anxiety.  May take Xanax 0.5 mg as needed for breakthrough anxiety, does not need refill today.  Will continue to monitor.

## 2023-02-07 NOTE — Assessment & Plan Note (Signed)
Increase Paxil to 30 mg daily.  Maximum daily dose 50 mg.  Continue bupropion 150 mg daily.  Will continue to monitor.

## 2023-02-07 NOTE — Assessment & Plan Note (Addendum)
Stable, well-controlled.  Continue Adderall 20 mg up to twice daily as needed.  In the future, may consider tapering off if needed.  PDMP reviewed, no aberrancies.  Overdose risk score 300.  Will continue to monitor.

## 2023-02-12 ENCOUNTER — Encounter: Payer: Self-pay | Admitting: Family Medicine

## 2023-02-12 DIAGNOSIS — E663 Overweight: Secondary | ICD-10-CM

## 2023-02-12 DIAGNOSIS — I10 Essential (primary) hypertension: Secondary | ICD-10-CM

## 2023-02-12 DIAGNOSIS — Z833 Family history of diabetes mellitus: Secondary | ICD-10-CM

## 2023-02-12 DIAGNOSIS — F909 Attention-deficit hyperactivity disorder, unspecified type: Secondary | ICD-10-CM

## 2023-02-12 DIAGNOSIS — R600 Localized edema: Secondary | ICD-10-CM

## 2023-02-14 MED ORDER — HYDROCHLOROTHIAZIDE 12.5 MG PO TABS: 12.5000 mg | ORAL_TABLET | Freq: Every day | ORAL | 3 refills | Status: DC

## 2023-02-14 NOTE — Addendum Note (Signed)
Addended by: Saralyn Pilar on: 02/14/2023 02:52 PM   Modules accepted: Orders

## 2023-02-14 NOTE — Addendum Note (Signed)
Addended by: Saralyn Pilar on: 02/14/2023 03:45 PM   Modules accepted: Orders

## 2023-03-07 ENCOUNTER — Encounter: Payer: Self-pay | Admitting: Dermatology

## 2023-03-07 ENCOUNTER — Ambulatory Visit: Payer: BC Managed Care – PPO | Admitting: Dermatology

## 2023-03-07 DIAGNOSIS — L409 Psoriasis, unspecified: Secondary | ICD-10-CM

## 2023-03-07 MED ORDER — VTAMA 1 % EX CREA: TOPICAL_CREAM | CUTANEOUS | 2 refills | Status: AC

## 2023-03-07 NOTE — Patient Instructions (Addendum)
Start Vtama cream once daily, at bedtime, to affected areas on face and in ears. Use until clear then at least an additional week.   Avoid picking and scratching at areas of psoriasis and eyebrow area.   Avoid picking/plucking eyebrows until improved.   Your prescription was sent to [facility] in [place]. A representative from [facility] will contact you within 3 business hours to verify your address and insurance information to schedule a free delivery. If for any reason you do not receive a phone call from them, please reach out to them. Their phone number is [phone] and their hours are Monday-Friday 9:00 am-5:00 pm.     Due to recent changes in healthcare laws, you may see results of your pathology and/or laboratory studies on MyChart before the doctors have had a chance to review them. We understand that in some cases there may be results that are confusing or concerning to you. Please understand that not all results are received at the same time and often the doctors may need to interpret multiple results in order to provide you with the best plan of care or course of treatment. Therefore, we ask that you please give [place] 2 business days to thoroughly review all your results before contacting the office for clarification. Should we see a critical lab result, you will be contacted sooner.   If You Need Anything After Your Visit  If you have any questions or concerns for your doctor, please call our main line at [phone] and press option 4 to reach your doctor's medical assistant. If no one answers, please leave a voicemail as directed and we will return your call as soon as possible. Messages left after 4 pm will be answered the following business day.   You may also send [place] a message via MyChart. We typically respond to MyChart messages within 1-2 business days.  For prescription refills, please ask your pharmacy to contact our office. Our fax number is  [phone].  If you have an urgent issue when the clinic is closed that cannot wait until the next business day, you can page your doctor at the number below.    Please note that while we do our best to be available for urgent issues outside of office hours, we are not available 24/7.   If you have an urgent issue and are unable to reach [place], you may choose to seek medical care at your doctor's office, retail clinic, urgent care center, or emergency room.  If you have a medical emergency, please immediately call 911 or go to the emergency department.  Pager Numbers  - Dr. Gwen Pounds: [phone]  - Dr. Roseanne Reno: [phone]  - Dr. Katrinka Blazing: [phone]   In the event of inclement weather, please call our main line at [phone] for an update on the status of any delays or closures.  Dermatology Medication Tips: Please keep the boxes that topical medications come in in order to help keep track of the instructions about where and how to use these. Pharmacies typically print the medication instructions only on the boxes and not directly on the medication tubes.   If your medication is too expensive, please contact our office at [phone] option 4 or send [place] a message through MyChart.   We are unable to tell what your co-pay for medications will be in advance as this is different depending on your insurance coverage. However, we may be able to find a substitute medication at lower cost or fill out paperwork  to get insurance to cover a needed medication.   If a prior authorization is required to get your medication covered by your insurance company, please allow [place] 1-2 business days to complete this process.  Drug prices often vary depending on where the prescription is filled and some pharmacies may offer cheaper prices.  The website www.goodrx.com contains coupons for medications through different pharmacies. The prices here do not account for what the cost may be with help from  insurance (it may be cheaper with your insurance), but the website can give you the price if you did not use any insurance.  - You can print the associated coupon and take it with your prescription to the pharmacy.  - You may also stop by our office during regular business hours and pick up a GoodRx coupon card.  - If you need your prescription sent electronically to a different pharmacy, notify our office through [facility] or by phone at [phone] option 4.     Si Usted Necesita Algo Despus de Su Visita  Tambin puede enviarnos un mensaje a travs de Clinical cytogeneticist. Por lo general respondemos a los mensajes de MyChart en el transcurso de 1 a 2 das hbiles.  Para renovar recetas, por favor pida a su farmacia que se ponga en contacto con nuestra oficina. Annie Sable de fax es [place] [phone].  Si tiene un asunto urgente cuando la clnica est cerrada y que no puede esperar hasta el siguiente da hbil, puede llamar/localizar a su doctor(a) al nmero que aparece a continuacin.   Por favor, tenga en cuenta que aunque hacemos todo lo posible para estar disponibles para asuntos urgentes fuera del horario de [place], no estamos disponibles las 24 horas del da, los 7 [address]  [address] de la [place].   Si tiene un problema urgente y no puede comunicarse con nosotros, puede optar por buscar atencin mdica  en el consultorio de su doctor(a), en una clnica privada, en un centro de atencin urgente o en una sala de emergencias.  Si tiene Engineer, drilling, por favor llame inmediatamente al 911 o vaya a la sala de emergencias.  Nmeros de bper  - Dr. Gwen Pounds: [phone]  - Dra. Roseanne Reno: [phone]  - Dr. Katrinka Blazing: [phone]   En caso de inclemencias del tiempo, por favor llame a Lacy Duverney principal al [phone] para una actualizacin sobre el [place] de cualquier retraso o cierre.  Consejos para la medicacin en dermatologa: Por favor, guarde las cajas en las que vienen los  medicamentos de uso tpico para ayudarle a seguir las instrucciones sobre dnde y cmo usarlos. Las farmacias generalmente imprimen las instrucciones del medicamento slo en las cajas y no directamente en los tubos del [place].   Si su medicamento es muy caro, por favor, pngase en contacto con Rolm Gala llamando al [phone] y presione la opcin 4 o envenos un mensaje a travs de Clinical cytogeneticist.   No podemos decirle cul ser su copago por los medicamentos por adelantado ya que esto es diferente dependiendo de la cobertura de su seguro. Sin embargo, es posible que podamos encontrar un medicamento sustituto a Audiological scientist un formulario para que el seguro cubra el medicamento que se considera necesario.   Si se requiere una autorizacin previa para que su compaa de seguros [place] su medicamento, por favor permtanos de 1 a 2 das hbiles para completar [address]  Los precios de los medicamentos varan con frecuencia dependiendo del Environmental consultant de dnde se surte la receta y [place]  farmacias pueden ofrecer precios ms baratos.  El sitio web www.goodrx.com tiene cupones para medicamentos de Health and safety inspector. Los precios aqu no tienen en cuenta lo que podra costar con la ayuda del seguro (puede ser ms barato con su seguro), pero el sitio web puede darle el precio si no utiliz Tourist information centre manager.  - Puede imprimir el cupn correspondiente y llevarlo con su receta a la farmacia.  - Tambin puede pasar por nuestra oficina durante el horario de atencin regular y Education officer, museum una tarjeta de cupones de GoodRx.  - Si necesita que su receta se enve electrnicamente a una farmacia diferente, informe a nuestra oficina a travs de MyChart de [place] o por telfono llamando al [phone] y presione la opcin 4.

## 2023-03-07 NOTE — Progress Notes (Deleted)
   Follow-Up Visit   Subjective  Jillian Edwards is a 57 y.o. female who presents for the following: *** The patient has spots, moles and lesions to be evaluated, some may be new or changing and the patient may have concern these could be cancer.   The following portions of the chart were reviewed this encounter and updated as appropriate: medications, allergies, medical history  Review of Systems:  No other skin or systemic complaints except as noted in HPI or Assessment and Plan.  Objective  Well appearing patient in no apparent distress; mood and affect are within normal limits.  ***A full examination was performed including scalp, head, eyes, ears, nose, lips, neck, chest, axillae, abdomen, back, buttocks, bilateral upper extremities, bilateral lower extremities, hands, feet, fingers, toes, fingernails, and toenails. All findings within normal limits unless otherwise noted below.  ***A focused examination was performed of the following areas: ***  Relevant exam findings are noted in the Assessment and Plan.    Assessment & Plan       No follow-ups on file.  I, Lawson Radar, CMA, am acting as scribe for Willeen Niece, MD.   Documentation: I have reviewed the above documentation for accuracy and completeness, and I agree with the above.  Willeen Niece, MD

## 2023-03-07 NOTE — Progress Notes (Signed)
   Follow Up Visit   Subjective  Jillian Edwards is a 57 y.o. female who presents for the following: Rash at eyebrows. Has been very red and inflamed. Improved recently. Has had scabs in eyebrows. Has to pick scabs because hair grows back under them.  C/O thinning at eyebrows. 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 Has used Triamcinolone 0.1% ointment twice a day for ~2 months, has not helped much. Has used Calcipotriene cream and mometasone in the past. Most recently has been using coconut oil at bedtime.  She states she is unsure if she has psoriasis. States her mother did have psoriasis at eyebrows and scalp.    The following portions of the chart were reviewed this encounter and updated as appropriate: medications, allergies, medical history  Review of Systems:  No other skin or systemic complaints except as noted in HPI or Assessment and Plan.  Objective  Well appearing patient in no apparent distress; mood and affect are within normal limits.  A focused examination was performed of the following areas: Face, ears, elbows  Relevant exam findings are noted in the Assessment and Plan.    Assessment & Plan     PSORIASIS Exam: mild erythema with healing excoriations and mild scale at eyebrows, hyperkeratosis at tips of thumbs, mild erythema at elbows, pink scaliness at ears. Scalp clear. 1% BSA.  Chronic and persistent condition with duration or expected duration over one year. Condition is bothersome/symptomatic for patient. Currently flared at eyebrows.  With LSC/koebnerization component and hypotrichosis of eyebrows due to picking.   Patient denies joint pain  Psoriasis is a chronic non-curable, but treatable genetic/hereditary disease that may have other systemic features affecting other organ systems such as joints (Psoriatic Arthritis). It is associated with an increased risk of inflammatory bowel disease, heart disease, non-alcoholic fatty liver disease, and  depression.  Treatments include light and laser treatments; topical medications; and systemic medications including oral and injectables.  Treatment Plan: D/C TMC 0.1% ointment Start Vtama cream once daily, at bedtime, to affected areas on face and ears. Use until clear then at least an additional week.  Samples given.  Apply Aquaphor to eyebrows during day to keep skin moist and to soften scale/scabs- do not pick or rub and let come off on its own.  Avoid picking and scratching at areas of psoriasis.  Avoid picking/plucking eyebrows until improved.     Return in about 4 weeks (around 04/04/2023) for TBSE, Psoriasis Follow Up.  I, Lawson Radar, CMA, am acting as scribe for Willeen Niece, MD.   Documentation: I have reviewed the above documentation for accuracy and completeness, and I agree with the above.  Willeen Niece, MD

## 2023-03-14 ENCOUNTER — Ambulatory Visit: Payer: BC Managed Care – PPO | Admitting: Family Medicine

## 2023-03-16 ENCOUNTER — Other Ambulatory Visit: Payer: Self-pay | Admitting: Family Medicine

## 2023-03-16 DIAGNOSIS — F909 Attention-deficit hyperactivity disorder, unspecified type: Secondary | ICD-10-CM

## 2023-03-16 MED ORDER — AMPHETAMINE-DEXTROAMPHETAMINE 20 MG PO TABS: 30.0000 mg | ORAL_TABLET | Freq: Two times a day (BID) | ORAL | 0 refills | Status: DC

## 2023-03-16 NOTE — Addendum Note (Signed)
Addended by: Saralyn Pilar on: 03/16/2023 10:13 AM   Modules accepted: Orders

## 2023-03-26 MED ORDER — WEGOVY 0.25 MG/0.5ML ~~LOC~~ SOAJ: 0.2500 mg | SUBCUTANEOUS | 0 refills | Status: DC

## 2023-03-26 NOTE — Addendum Note (Signed)
Addended by: Saralyn Pilar on: 03/26/2023 03:06 PM   Modules accepted: Orders

## 2023-04-09 DIAGNOSIS — H524 Presbyopia: Secondary | ICD-10-CM | POA: Diagnosis not present

## 2023-04-10 ENCOUNTER — Ambulatory Visit (INDEPENDENT_AMBULATORY_CARE_PROVIDER_SITE_OTHER): Payer: BC Managed Care – PPO | Admitting: Dermatology

## 2023-04-10 DIAGNOSIS — D225 Melanocytic nevi of trunk: Secondary | ICD-10-CM

## 2023-04-10 DIAGNOSIS — L814 Other melanin hyperpigmentation: Secondary | ICD-10-CM | POA: Diagnosis not present

## 2023-04-10 DIAGNOSIS — Z1283 Encounter for screening for malignant neoplasm of skin: Secondary | ICD-10-CM

## 2023-04-10 DIAGNOSIS — L84 Corns and callosities: Secondary | ICD-10-CM

## 2023-04-10 DIAGNOSIS — D2262 Melanocytic nevi of left upper limb, including shoulder: Secondary | ICD-10-CM

## 2023-04-10 DIAGNOSIS — L409 Psoriasis, unspecified: Secondary | ICD-10-CM

## 2023-04-10 DIAGNOSIS — D1801 Hemangioma of skin and subcutaneous tissue: Secondary | ICD-10-CM

## 2023-04-10 DIAGNOSIS — W098XXA Fall on or from other playground equipment, initial encounter: Secondary | ICD-10-CM | POA: Diagnosis not present

## 2023-04-10 DIAGNOSIS — L578 Other skin changes due to chronic exposure to nonionizing radiation: Secondary | ICD-10-CM | POA: Diagnosis not present

## 2023-04-10 DIAGNOSIS — D2239 Melanocytic nevi of other parts of face: Secondary | ICD-10-CM

## 2023-04-10 DIAGNOSIS — L821 Other seborrheic keratosis: Secondary | ICD-10-CM

## 2023-04-10 DIAGNOSIS — D229 Melanocytic nevi, unspecified: Secondary | ICD-10-CM

## 2023-04-10 DIAGNOSIS — Z86018 Personal history of other benign neoplasm: Secondary | ICD-10-CM

## 2023-04-10 DIAGNOSIS — D2272 Melanocytic nevi of left lower limb, including hip: Secondary | ICD-10-CM

## 2023-04-10 NOTE — Patient Instructions (Addendum)
 Recommend using Curad Mediplast pads. Cut to fit wart or callus. Cover with Elastoplast waterproof tape or any waterproof band-aid. Change every 3 to 4 days, or sooner if necessary.  Treatment may require several months of regular use before results are seen.   Discussed cosmetic procedure cryotherapy, noncovered.  $60 for 1st lesion and $15 for each additional lesion if done on the same day.  Maximum charge $350.  One touch-up treatment included no charge. Discussed risks of treatment including dyspigmentation, small scar, and/or recurrence. Recommend daily broad spectrum sunscreen SPF 30+/photoprotection to treated areas once healed.  Counseling for BBL / IPL / Laser and Coordination of Care Discussed the treatment option of Broad Band Light (BBL) /Intense Pulsed Light (IPL)/ Laser for skin discoloration, including brown spots and redness.  Typically we recommend at least 1-3 treatment sessions about 5-8 weeks apart for best results.  Cannot have tanned skin when BBL performed, and regular use of sunscreen/photoprotection is advised after the procedure to help maintain results. The patient's condition may also require maintenance treatments in the future.  The fee for BBL / laser treatments is $350 per treatment session for the whole face.  A fee can be quoted for other parts of the body.  Insurance typically does not pay for BBL/laser treatments and therefore the fee is an out-of-pocket cost. Recommend prophylactic valtrex  treatment. Once scheduled for procedure, will send Rx in prior to patient's appointment.    Melanoma ABCDEs  Melanoma is the most dangerous type of skin cancer, and is the leading cause of death from skin disease.  You are more likely to develop melanoma if you: Have light-colored skin, light-colored eyes, or red or blond hair Spend a lot of time in the sun Tan regularly, either outdoors or in a tanning bed Have had blistering sunburns, especially during childhood Have a close  family member who has had a melanoma Have atypical moles or large birthmarks  Early detection of melanoma is key since treatment is typically straightforward and cure rates are extremely high if we catch it early.   The first sign of melanoma is often a change in a mole or a new dark spot.  The ABCDE system is a way of remembering the signs of melanoma.  A for asymmetry:  The two halves do not match. B for border:  The edges of the growth are irregular. C for color:  A mixture of colors are present instead of an even brown color. D for diameter:  Melanomas are usually (but not always) greater than 6mm - the size of a pencil eraser. E for evolution:  The spot keeps changing in size, shape, and color.  Please check your skin once per month between visits. You can use a small mirror in front and a large mirror behind you to keep an eye on the back side or your body.   If you see any new or changing lesions before your next follow-up, please call to schedule a visit.  Please continue daily skin protection including broad spectrum sunscreen SPF 30+ to sun-exposed areas, reapplying every 2 hours as needed when you're outdoors.   Staying in the shade or wearing long sleeves, sun glasses (UVA+UVB protection) and wide brim hats (4-inch brim around the entire circumference of the hat) are also recommended for sun protection.   Due to recent changes in healthcare laws, you may see results of your pathology and/or laboratory studies on MyChart before the doctors have had a chance to review them.  We understand that in some cases there may be results that are confusing or concerning to you. Please understand that not all results are received at the same time and often the doctors may need to interpret multiple results in order to provide you with the best plan of care or course of treatment. Therefore, we ask that you please give us  2 business days to thoroughly review all your results before contacting the  office for clarification. Should we see a critical lab result, you will be contacted sooner.   If You Need Anything After Your Visit  If you have any questions or concerns for your doctor, please call our main line at [phone] and press option 4 to reach your doctor's medical assistant. If no one answers, please leave a voicemail as directed and we will return your call as soon as possible. Messages left after 4 pm will be answered the following business day.   You may also send us  a message via MyChart. We typically respond to MyChart messages within 1-2 business days.  For prescription refills, please ask your pharmacy to contact our office. Our fax number is [phone].  If you have an urgent issue when the clinic is closed that cannot wait until the next business day, you can page your doctor at the number below.    Please note that while we do our best to be available for urgent issues outside of office hours, we are not available 24/7.   If you have an urgent issue and are unable to reach us , you may choose to seek medical care at your doctor's office, retail clinic, urgent care center, or emergency room.  If you have a medical emergency, please immediately call 911 or go to the emergency department.  Pager Numbers  - Dr. Hester: [phone]  - Dr. Jackquline: [phone]  - Dr. Claudene: [phone]   In the event of inclement weather, please call our main line at [phone] for an update on the status of any delays or closures.  Dermatology Medication Tips: Please keep the boxes that topical medications come in in order to help keep track of the instructions about where and how to use these. Pharmacies typically print the medication instructions only on the boxes and not directly on the medication tubes.   If your medication is too expensive, please contact our office at [phone] option 4 or send us  a message through MyChart.   We are unable to tell what your  co-pay for medications will be in advance as this is different depending on your insurance coverage. However, we may be able to find a substitute medication at lower cost or fill out paperwork to get insurance to cover a needed medication.   If a prior authorization is required to get your medication covered by your insurance company, please allow us  1-2 business days to complete this process.  Drug prices often vary depending on where the prescription is filled and some pharmacies may offer cheaper prices.  The website www.goodrx.com contains coupons for medications through different pharmacies. The prices here do not account for what the cost may be with help from insurance (it may be cheaper with your insurance), but the website can give you the price if you did not use any insurance.  - You can print the associated coupon and take it with your prescription to the pharmacy.  - You may also stop by our office during regular business hours and pick up a GoodRx coupon card.  -  If you need your prescription sent electronically to a different pharmacy, notify our office through [facility] or by phone at [phone] option 4.     Si Usted Necesita Algo Despus de Su Visita  Tambin puede enviarnos un mensaje a travs de Clinical Cytogeneticist. Por lo general respondemos a los mensajes de MyChart en el transcurso de 1 a 2 das hbiles.  Para renovar recetas, por favor pida a su farmacia que se ponga en contacto con nuestra oficina. Randi lakes de fax es [place] [phone].  Si tiene un asunto urgente cuando la clnica est cerrada y que no puede esperar hasta el siguiente da hbil, puede llamar/localizar a su doctor(a) al nmero que aparece a continuacin.   Por favor, tenga en cuenta que aunque hacemos todo lo posible para estar disponibles para asuntos urgentes fuera del horario de [place], no estamos disponibles las 24 horas del da, los 7 [address]  [phone] de la [place].   Si tiene un problema urgente y no  puede comunicarse con nosotros, puede optar por buscar atencin mdica  en el consultorio de su doctor(a), en una clnica privada, en un centro de atencin urgente o en una sala de emergencias.  Si tiene engineer, drilling, por favor llame inmediatamente al 911 o vaya a la sala de emergencias.  Nmeros de bper  - Dr. Hester: [phone]  - Dra. Jackquline: [phone]  - Dr. Claudene: [phone]   En caso de inclemencias del tiempo, por favor llame a landry capes principal al [phone] para una actualizacin sobre el [place] de cualquier retraso o cierre.  Consejos para la medicacin en dermatologa: Por favor, guarde las cajas en las que vienen los medicamentos de uso tpico para ayudarle a seguir las instrucciones sobre dnde y cmo usarlos. Las farmacias generalmente imprimen las instrucciones del medicamento slo en las cajas y no directamente en los tubos del [place].   Si su medicamento es muy caro, por favor, pngase en contacto con landry rieger llamando al [phone] y presione la opcin 4 o envenos un mensaje a travs de Clinical Cytogeneticist.   No podemos decirle cul ser su copago por los medicamentos por adelantado ya que esto es diferente dependiendo de la cobertura de su seguro. Sin embargo, es posible que podamos encontrar un medicamento sustituto a audiological scientist un formulario para que el seguro cubra el medicamento que se considera necesario.   Si se requiere una autorizacin previa para que su compaa de seguros cubra su medicamento, por favor permtanos de 1 a 2 das hbiles para completar este proceso.  Los precios de los medicamentos varan con frecuencia dependiendo del environmental consultant de dnde se surte la receta y alguna farmacias pueden ofrecer precios ms baratos.  El sitio web www.goodrx.com tiene cupones para medicamentos de health and safety inspector. Los precios aqu no tienen en cuenta lo que podra costar con la ayuda del seguro (puede ser ms barato con su seguro),  pero el sitio web puede darle el precio si no utiliz tourist information centre manager.  - Puede imprimir el cupn correspondiente y llevarlo con su receta a la farmacia.  - Tambin puede pasar por nuestra oficina durante el horario de atencin regular y education officer, museum una tarjeta de cupones de GoodRx.  - Si necesita que su receta se enve electrnicamente a una farmacia diferente, informe a nuestra oficina a travs de MyChart de [place] o por telfono llamando al [phone] y presione la opcin 4.

## 2023-04-10 NOTE — Progress Notes (Signed)
 Follow-Up Visit   Subjective  Jillian Edwards is a 58 y.o. female who presents for the following: Skin Cancer Screening and Full Body Skin Exam  The patient presents for Total-Body Skin Exam (TBSE) for skin cancer screening and mole check. The patient has spots, moles and lesions to be evaluated, some may be new or changing. She has psoriasis of the eyebrows, ears, elbows, and tips of thumbs, improving with Vtama  Cream and Aquaphor. She has a few spots on the face and legs to check, some get irritated. History of dysplastic nevi.    The following portions of the chart were reviewed this encounter and updated as appropriate: medications, allergies, medical history  Review of Systems:  No other skin or systemic complaints except as noted in HPI or Assessment and Plan.  Objective  Well appearing patient in no apparent distress; mood and affect are within normal limits.  A full examination was performed including scalp, head, eyes, ears, nose, lips, neck, chest, axillae, abdomen, back, buttocks, bilateral upper extremities, bilateral lower extremities, hands, feet, fingers, toes, fingernails, and toenails. All findings within normal limits unless otherwise noted below.   Relevant physical exam findings are noted in the Assessment and Plan.    Assessment & Plan   SKIN CANCER SCREENING PERFORMED TODAY.  ACTINIC DAMAGE - Chronic condition, secondary to cumulative UV/sun exposure - diffuse scaly erythematous macules with underlying dyspigmentation - Recommend daily broad spectrum sunscreen SPF 30+ to sun-exposed areas, reapply every 2 hours as needed.  - Staying in the shade or wearing long sleeves, sun glasses (UVA+UVB protection) and wide brim hats (4-inch brim around the entire circumference of the hat) are also recommended for sun protection.  - Call for new or changing lesions.  LENTIGINES, SEBORRHEIC KERATOSES, HEMANGIOMAS - Benign normal skin lesions Discussed cosmetic procedure  cryotherapy for lentigos/Sks, noncovered.  $60 for 1st lesion and $15 for each additional lesion if done on the same day.  Maximum charge $350.  One touch-up treatment included no charge. Discussed risks of treatment including dyspigmentation, small scar, and/or recurrence. Recommend daily broad spectrum sunscreen SPF 30+/photoprotection to treated areas once healed.  - Benign-appearing - Call for any changes  MELANOCYTIC NEVI - Tan-brown and/or pink-flesh-colored symmetric macules and papules; flesh papules at left preauricular, right cheek, right jaw, left clavicle, left anterior shoulder, upper abdomen, right inframammary.   - 4.0 mm medium dark brown macule at left anterior shoulder - 3.0 mm regular brown macule at left 2nd distal dorsal toe - 3.0 mm med dark brown macule at right upper abdomen  - Benign appearing on exam today - Observation - Call clinic for new or changing moles - Recommend daily use of broad spectrum spf 30+ sunscreen to sun-exposed areas.  - Patient will return for shave removal to nevi (flesh papules). (will need 2 visits to have all 7 removed ) Discussed resulting small scar with shave removal, and possible recurrence of lesion.  Recommend vaseline ointment to area daily and cover until healed.  Recommend photoprotection/sunscreen to area to prevent discoloration of scar.  Once healed, may apply OTC Serica scar gel bid to thickened scars.  CORN/CALLUS vs wart  Exam: Firm papule at left lat 4th toe web space at pressure point to 5th toe.  Recommend using Curad Mediplast pads. Cut to fit wart or callus. Cover with Elastoplast waterproof tape or any waterproof band-aid. Change every 3 to 4 days, or sooner if necessary.  Treatment may require several months of regular use before  results are seen.  PSORIASIS Exam: Mild erythema at eyebrows, tips of thumbs; mild erythema at elbows <1% BSA.  Chronic and persistent condition with duration or expected duration over one  year. Condition is improving with treatment but not currently at goal. With h/o LSC/koebnerization component and hypotrichosis of eyebrows due to picking.  patient denies joint pain  Psoriasis is a chronic non-curable, but treatable genetic/hereditary disease that may have other systemic features affecting other organ systems such as joints (Psoriatic Arthritis). It is associated with an increased risk of inflammatory bowel disease, heart disease, non-alcoholic fatty liver disease, and depression.  Treatments include light and laser treatments; topical medications; and systemic medications including oral and injectables.  Treatment Plan: Continue Vtama  cream every day prn. Apply Aquaphor to eyebrows during day. Avoid picking/waxing/plucking eyebrow hairs.  Could consider getting electrolysis.  History of Dysplastic Nevi - No evidence of recurrence today - Recommend regular full body skin exams - Recommend daily broad spectrum sunscreen SPF 30+ to sun-exposed areas, reapply every 2 hours as needed.  - Call if any new or changing lesions are noted between office visits   Return appts x 2 (15 mins, not double-booked), for shave removal, nevi.  IAndrea Kerns, CMA, am acting as scribe for Rexene Rattler, MD .   Documentation: I have reviewed the above documentation for accuracy and completeness, and I agree with the above.  Rexene Rattler, MD

## 2023-04-19 NOTE — Addendum Note (Signed)
Addended by: Saralyn Pilar on: 04/19/2023 08:20 AM   Modules accepted: Orders

## 2023-04-19 NOTE — Progress Notes (Deleted)
NEUROLOGY CONSULTATION NOTE  Jillian Edwards MRN: [id number] DOB: [birth year]  Referring provider: Saralyn Pilar, PA-C Primary care provider: Saralyn Pilar, PA-C  Reason for consult:  migraines  Assessment/Plan:   ***   Subjective:  Jillian Edwards is a 58 year old ***-handed female with ADHD, HTN, depression, anxiety and history of stroke who presents for migraines.  History supplemented by referring provider's note.  MRI of brain from 02/2016 and MRI bran and CTA neck from 07/2004 personally reviewed.  Onset:  *** Location:  *** Quality:  *** Intensity:  ***.  *** denies new headache, thunderclap headache or severe headache that wakes *** from sleep. Aura:  *** Prodrome:  *** Postdrome:  *** Associated symptoms:  ***.  *** denies associated unilateral numbness or weakness. Duration:  *** Frequency:  *** Frequency of abortive medication: *** Triggers:  *** Relieving factors:  *** Activity:  ***  Patient has history of stroke in May 2006 presenting as headache and peripheral vision loss.  MRI of brain on 08/02/2004 revealed an acute infarct involving the right occipital lobe and small portion of the adjacent right parietal lobe.  CTA of neck was unremarkable.  ***.  She had another MRI of the brain on 03/01/2016 to evaluate headache which revealed the old right occipital infarct but no acute findings.    Past NSAIDS/analgesics:  *** Past abortive triptans:  sumatriptan tab Past abortive ergotamine:  none Past muscle relaxants:  tizanidine Past anti-emetic:  *** Past antihypertensive medications:  *** Past antidepressant medications:  escitalopram Past anticonvulsant medications:  *** Past anti-CGRP:  *** Other past therapies:  ***  Current NSAIDS/analgesics:  ibuprofen 600mg , ASA 81mg  daily Current triptans:  none Current ergotamine:  none Current anti-emetic:  none Current muscle relaxants:  none Current Antihypertensive medications:  propranolol ER 60mg  daily,  valsartan, HCTZ Current Antidepressant medications:  Wellbutrin XL 150mg  daily, paroxetine 30mg  daily Current Anticonvulsant medications:  gabapentin 100-300mg  at bedtime Current anti-CGRP:  Ubrelvy 100mg  *** Current Vitamins/Herbal/Supplements:  D, E Current Antihistamines/Decongestants:  Astelin NS Other therapy:  *** Birth control:  none Other medications:  alprazolam, Adderall, Wegovy   Caffeine:  *** Alcohol:  *** Smoker:  *** Diet:  *** Exercise:  *** Depression:  ***; Anxiety:  *** Other pain:  *** Sleep hygiene:  *** Family history of headache:  ***      PAST MEDICAL HISTORY: Past Medical History:  Diagnosis Date   ADD (attention deficit disorder)    Allergy    Anxiety    Depression    Dysplastic nevus 11/07/2017   left lat epigastric, right mid post thigh, right lat buttocks, left sup med buttocks   Dysplastic nevus 10/14/2018   right sup lat buttock   Dysplastic nevus 04/24/2019   low back spinal, right low back 7.0 cm lat to spine   Frequent urination    GERD (gastroesophageal reflux disease)    Headache    Herpes genitalis    Hypertension    Stress headaches    Stroke (HCC) ~2005    loss of peripheral vision   Stroke or transient ischemic attack (TIA) diagnosed during current admission    Urological disorder    bladder has dropped    PAST SURGICAL HISTORY: Past Surgical History:  Procedure Laterality Date   BREAST BIOPSY Left 02/06/2013   stereotactic biopsy of calcifications-benign   BREAST ENHANCEMENT SURGERY  08/2003   COLONOSCOPY WITH PROPOFOL N/A 12/14/2016   Procedure: COLONOSCOPY WITH PROPOFOL;  Surgeon: Wyline Mood, MD;  Location: [facility] ENDOSCOPY;  Service: Gastroenterology;  Laterality: N/A;   COLPOSCOPY  03/1998; 07/1999   2000-no lesion; 2001-CIN1   ENDOMETRIAL BIOPSY  03/2007   proliferative    MEDICATIONS: Current Outpatient Medications on File Prior to Visit  Medication Sig Dispense Refill   ALPRAZolam (XANAX) 0.5 MG tablet  Take 1 tablet (0.5 mg total) by mouth 2 (two) times daily as needed for anxiety. 60 tablet 3   amphetamine-dextroamphetamine (ADDERALL) 20 MG tablet Take 1.5 tablets (30 mg total) by mouth 2 (two) times daily. 90 tablet 0   amphetamine-dextroamphetamine (ADDERALL) 20 MG tablet Take 1.5 tablets (30 mg total) by mouth 2 (two) times daily. 90 tablet 0   aspirin EC 81 MG tablet Take 81 mg by mouth daily.     azelastine (ASTELIN) 0.1 % nasal spray Place 1 spray into both nostrils 2 (two) times daily. Use in each nostril as directed 30 mL 3   buPROPion (WELLBUTRIN XL) 150 MG 24 hr tablet Take 1 tablet po QD 90 tablet 1   calcipotriene (DOVONOX) 0.005 % cream Apply topically 3 (three) times a week. 60 g 6   cholecalciferol (VITAMIN D) 1000 units tablet Take 2,000 Units by mouth daily.      gabapentin (NEURONTIN) 100 MG capsule Take 1-3 capsules (100-300 mg total) by mouth at bedtime. 90 capsule 3   hydrochlorothiazide (HYDRODIURIL) 12.5 MG tablet SMARTSIG:1.0 Tablet(s) By Mouth Daily     ibuprofen (ADVIL) 600 MG tablet Take 1 tablet (600 mg total) by mouth every 8 (eight) hours as needed. 90 tablet 1   montelukast (SINGULAIR) 10 MG tablet Take 1 tablet (10 mg total) by mouth at bedtime. 90 tablet 1   PARoxetine (PAXIL) 30 MG tablet Take 1 tablet (30 mg total) by mouth daily. 30 tablet 1   propranolol ER (INDERAL LA) 60 MG 24 hr capsule Take 1 capsule (60 mg total) by mouth daily. 90 capsule 1   Tapinarof (VTAMA) 1 % CREA Apply once daily to affected areas on face. 60 g 2   Ubrogepant (UBRELVY) 100 MG TABS Take 1 tablet (100 mg total) by mouth 2 (two) times daily as needed. Dx: G43.909, G44.52 10 tablet 3   valsartan (DIOVAN) 80 MG tablet Take 1 tablet (80 mg total) by mouth daily. 90 tablet 3   Vitamin D, Ergocalciferol, (DRISDOL) 1.25 MG (50000 UNIT) CAPS capsule TAKE 1 CAPSULE BY MOUTH ONCE A WEEK 12 capsule 1   vitamin E 180 MG (400 UNITS) capsule Take 1 capsule by mouth daily.     WEGOVY 0.25  MG/0.5ML SOAJ Inject 0.25 mg into the skin once a week. Use this dose for 1 month (4 shots) and then increase to next higher dose. 2 mL 0   No current facility-administered medications on file prior to visit.    ALLERGIES: No Known Allergies  FAMILY HISTORY: Family History  Problem Relation Age of Onset   Hypertension Father    Diabetes Father        Has also had an amputation   Heart attack Father    Diabetes Mellitus II Father    Diabetes Mellitus II Brother    Hypertension Brother    Breast cancer Maternal Grandmother 50   Diabetes Mellitus II Paternal Grandmother     Objective:  *** General: No acute distress.  Patient appears well-groomed.   Head:  Normocephalic/atraumatic Eyes:  fundi examined but not visualized Neck: supple, no paraspinal tenderness, full range of motion Back: No paraspinal tenderness Heart: regular  rate and rhythm Lungs: Clear to auscultation bilaterally. Vascular: No carotid bruits. Neurological Exam: Mental status: alert and oriented to person, place, and time, speech fluent and not dysarthric, language intact. Cranial nerves: CN I: not tested CN II: pupils equal, round and reactive to light, visual fields intact CN III, IV, VI:  full range of motion, no nystagmus, no ptosis CN V: facial sensation intact. CN VII: upper and lower face symmetric CN VIII: hearing intact CN IX, X: gag intact, uvula midline CN XI: sternocleidomastoid and trapezius muscles intact CN XII: tongue midline Bulk & Tone: normal, no fasciculations. Motor:  muscle strength 5/5 throughout Sensation:  Pinprick, temperature and vibratory sensation intact. Deep Tendon Reflexes:  2+ throughout,  toes downgoing.   Finger to nose testing:  Without dysmetria.   Heel to shin:  Without dysmetria.   Gait:  Normal station and stride.  Romberg negative.    Thank you for allowing me to take part in the care of this patient.  Shon Millet, DO  CC: ***

## 2023-04-20 ENCOUNTER — Telehealth: Payer: Self-pay | Admitting: *Deleted

## 2023-04-20 ENCOUNTER — Other Ambulatory Visit: Payer: Self-pay | Admitting: Family Medicine

## 2023-04-20 ENCOUNTER — Encounter: Payer: Self-pay | Admitting: Neurology

## 2023-04-20 ENCOUNTER — Ambulatory Visit: Payer: BC Managed Care – PPO | Admitting: Neurology

## 2023-04-20 DIAGNOSIS — F321 Major depressive disorder, single episode, moderate: Secondary | ICD-10-CM

## 2023-04-20 DIAGNOSIS — Z029 Encounter for administrative examinations, unspecified: Secondary | ICD-10-CM

## 2023-04-20 DIAGNOSIS — E559 Vitamin D deficiency, unspecified: Secondary | ICD-10-CM

## 2023-04-20 DIAGNOSIS — F411 Generalized anxiety disorder: Secondary | ICD-10-CM

## 2023-04-20 NOTE — Telephone Encounter (Signed)
LVM for pt to call office to inform her that she should start taking Vit D 2000 international units daily per Saralyn Pilar PA.

## 2023-04-25 ENCOUNTER — Ambulatory Visit: Payer: BC Managed Care – PPO | Admitting: Dermatology

## 2023-04-25 DIAGNOSIS — D485 Neoplasm of uncertain behavior of skin: Secondary | ICD-10-CM

## 2023-04-25 DIAGNOSIS — D492 Neoplasm of unspecified behavior of bone, soft tissue, and skin: Secondary | ICD-10-CM

## 2023-04-25 DIAGNOSIS — D2239 Melanocytic nevi of other parts of face: Secondary | ICD-10-CM

## 2023-04-25 NOTE — Progress Notes (Signed)
Follow-Up Visit   Subjective  Jillian Edwards is a 58 y.o. female who presents for the following: Patient here today for removal of irritated nevi.  The following portions of the chart were reviewed this encounter and updated as appropriate: medications, allergies, medical history  Review of Systems:  No other skin or systemic complaints except as noted in HPI or Assessment and Plan.  Objective  Well appearing patient in no apparent distress; mood and affect are within normal limits.    A focused examination was performed of the following areas: the face   Relevant exam findings are noted in the Assessment and Plan.  R mid cheek Flesh colored papule 0.5 cm  R mandible Flesh colored papule 0.5 cm R med cheek Flesh colored papule 0.4 cm R zygoma Flesh colored papule 0.4 cm  Assessment & Plan     NEOPLASM OF UNCERTAIN BEHAVIOR OF SKIN (4) R mid cheek Epidermal / dermal shaving  Lesion diameter (cm):  0.5 Informed consent: discussed and consent obtained   Patient was prepped and draped in usual sterile fashion: area prepped with alcohol. Anesthesia: the lesion was anesthetized in a standard fashion   Anesthetic:  1% lidocaine w/ epinephrine 1-100,000 buffered w/ 8.4% NaHCO3 Instrument used: flexible razor blade   Hemostasis achieved with: pressure, aluminum chloride and electrodesiccation   Outcome: patient tolerated procedure well   Post-procedure details: wound care instructions given   Post-procedure details comment:  Ointment and small bandage applied Specimen 1 - Surgical pathology Differential Diagnosis: D48.5 irritated nevus r/o dysplasia Check Margins: No R mandible Epidermal / dermal shaving  Lesion diameter (cm):  0.5 Informed consent: discussed and consent obtained   Patient was prepped and draped in usual sterile fashion: area prepped with alcohol. Anesthesia: the lesion was anesthetized in a standard fashion   Anesthetic:  1% lidocaine w/  epinephrine 1-100,000 buffered w/ 8.4% NaHCO3 Instrument used: flexible razor blade   Hemostasis achieved with: pressure, aluminum chloride and electrodesiccation   Outcome: patient tolerated procedure well   Post-procedure details: wound care instructions given   Post-procedure details comment:  Ointment and small bandage applied Specimen 2 - Surgical pathology Differential Diagnosis: D48.5 irritated nevus r/o dysplasia Check Margins: No R med cheek Epidermal / dermal shaving  Lesion diameter (cm):  0.4 Informed consent: discussed and consent obtained   Patient was prepped and draped in usual sterile fashion: area prepped with alcohol. Anesthesia: the lesion was anesthetized in a standard fashion   Anesthetic:  1% lidocaine w/ epinephrine 1-100,000 buffered w/ 8.4% NaHCO3 Instrument used: flexible razor blade   Hemostasis achieved with: pressure, aluminum chloride and electrodesiccation   Outcome: patient tolerated procedure well   Post-procedure details: wound care instructions given   Post-procedure details comment:  Ointment and small bandage applied Specimen 3 - Surgical pathology Differential Diagnosis: D48.5 irritated nevus r/o dysplasia Check Margins: No R zygoma Epidermal / dermal shaving  Lesion diameter (cm):  0.4 Informed consent: discussed and consent obtained   Patient was prepped and draped in usual sterile fashion: area prepped with alcohol. Anesthesia: the lesion was anesthetized in a standard fashion   Anesthetic:  1% lidocaine w/ epinephrine 1-100,000 buffered w/ 8.4% NaHCO3 Instrument used: flexible razor blade   Hemostasis achieved with: pressure, aluminum chloride and electrodesiccation   Outcome: patient tolerated procedure well   Post-procedure details: wound care instructions given   Post-procedure details comment:  Ointment and small bandage applied Specimen 4 - Surgical pathology Differential Diagnosis: D48.5 irritated nevus r/o  dysplasia Check  Margins: No Discussed resulting small scar with shave removal, and possible recurrence of lesion.  Recommend vaseline ointment to area daily and cover until healed.  Recommend photoprotection/sunscreen to area to prevent discoloration of scar.  Once healed, may apply OTC Serica scar gel bid to thickened scars.   Return for appointment as scheduled.  Maylene Roes, CMA, am acting as scribe for Willeen Niece, MD .   Documentation: I have reviewed the above documentation for accuracy and completeness, and I agree with the above.  Willeen Niece, MD

## 2023-04-25 NOTE — Patient Instructions (Addendum)
You will have a small scar with shave removal, and possible recurrence of lesion.  Recommend vaseline ointment to area daily and cover until healed.  Recommend photoprotection/sunscreen to area to prevent discoloration of scar.  Once healed, may apply OTC Serica scar gel bid to thickened scars.    Recommend Serica moisturizing scar formula cream every night every night for the first year after a scar appears to help with scar remodeling if desired. Scars remodel on their own for a full year and will gradually improve in appearance over time.   Recommend using Curad Mediplast pads. Cut to fit wart or callus on foot. Cover with Elastoplast waterproof tape or any waterproof band-aid. Change every 3 to 4 days, or sooner if necessary.  Treatment may require several months of regular use before results are seen.     Wound Care Instructions  Cleanse wound gently with soap and water once a day then pat dry with clean gauze. Apply a thin coat of Petrolatum (petroleum jelly, "Vaseline") over the wound (unless you have an allergy to this). We recommend that you use a new, sterile tube of Vaseline. Do not pick or remove scabs. Do not remove the yellow or white "healing tissue" from the base of the wound.  Cover the wound with fresh, clean, nonstick gauze and secure with paper tape. You may use Band-Aids in place of gauze and tape if the wound is small enough, but would recommend trimming much of the tape off as there is often too much. Sometimes Band-Aids can irritate the skin.  You should call the office for your biopsy report after 1 week if you have not already been contacted.  If you experience any problems, such as abnormal amounts of bleeding, swelling, significant bruising, significant pain, or evidence of infection, please call the office immediately.  FOR ADULT SURGERY PATIENTS: If you need something for pain relief you may take 1 extra strength Tylenol (acetaminophen) AND 2 Ibuprofen (200mg  each)  together every 4 hours as needed for pain. (do not take these if you are allergic to them or if you have a reason you should not take them.) Typically, you may only need pain medication for 1 to 3 days.   Due to recent changes in healthcare laws, you may see results of your pathology and/or laboratory studies on MyChart before the doctors have had a chance to review them. We understand that in some cases there may be results that are confusing or concerning to you. Please understand that not all results are received at the same time and often the doctors may need to interpret multiple results in order to provide you with the best plan of care or course of treatment. Therefore, we ask that you please give [place] 2 business days to thoroughly review all your results before contacting the office for clarification. Should we see a critical lab result, you will be contacted sooner.   If You Need Anything After Your Visit  If you have any questions or concerns for your doctor, please call our main line at [phone] and press option 4 to reach your doctor's medical assistant. If no one answers, please leave a voicemail as directed and we will return your call as soon as possible. Messages left after 4 pm will be answered the following business day.   You may also send [place] a message via MyChart. We typically respond to MyChart messages within 1-2 business days.  For prescription refills, please ask your pharmacy to contact our  office. Our fax number is [phone].  If you have an urgent issue when the clinic is closed that cannot wait until the next business day, you can page your doctor at the number below.    Please note that while we do our best to be available for urgent issues outside of office hours, we are not available 24/7.   If you have an urgent issue and are unable to reach [place], you may choose to seek medical care at your doctor's office, retail clinic, urgent care center, or emergency  room.  If you have a medical emergency, please immediately call 911 or go to the emergency department.  Pager Numbers  - Dr. Gwen Pounds: [phone]  - Dr. Roseanne Reno: [phone]  - Dr. Katrinka Blazing: [phone]   In the event of inclement weather, please call our main line at [phone] for an update on the status of any delays or closures.  Dermatology Medication Tips: Please keep the boxes that topical medications come in in order to help keep track of the instructions about where and how to use these. Pharmacies typically print the medication instructions only on the boxes and not directly on the medication tubes.   If your medication is too expensive, please contact our office at [phone] option 4 or send [place] a message through MyChart.   We are unable to tell what your co-pay for medications will be in advance as this is different depending on your insurance coverage. However, we may be able to find a substitute medication at lower cost or fill out paperwork to get insurance to cover a needed medication.   If a prior authorization is required to get your medication covered by your insurance company, please allow [place] 1-2 business days to complete this process.  Drug prices often vary depending on where the prescription is filled and some pharmacies may offer cheaper prices.  The website www.goodrx.com contains coupons for medications through different pharmacies. The prices here do not account for what the cost may be with help from insurance (it may be cheaper with your insurance), but the website can give you the price if you did not use any insurance.  - You can print the associated coupon and take it with your prescription to the pharmacy.  - You may also stop by our office during regular business hours and pick up a GoodRx coupon card.  - If you need your prescription sent electronically to a different pharmacy, notify our office through [facility] or by phone at  [phone] option 4.     Si Usted Necesita Algo Despus de Su Visita  Tambin puede enviarnos un mensaje a travs de Clinical cytogeneticist. Por lo general respondemos a los mensajes de MyChart en el transcurso de 1 a 2 das hbiles.  Para renovar recetas, por favor pida a su farmacia que se ponga en contacto con nuestra oficina. Annie Sable de fax es [place] [phone].  Si tiene un asunto urgente cuando la clnica est cerrada y que no puede esperar hasta el siguiente da hbil, puede llamar/localizar a su doctor(a) al nmero que aparece a continuacin.   Por favor, tenga en cuenta que aunque hacemos todo lo posible para estar disponibles para asuntos urgentes fuera del horario de [place], no estamos disponibles las 24 horas del da, los 7 [address]  [address] de la [place].   Si tiene un problema urgente y no puede comunicarse con nosotros, puede optar por buscar atencin mdica  en el consultorio de su doctor(a), en una clnica privada,  en un centro de atencin urgente o en una sala de emergencias.  Si tiene Engineer, drilling, por favor llame inmediatamente al 911 o vaya a la sala de emergencias.  Nmeros de bper  - Dr. Gwen Pounds: [phone]  - Dra. Roseanne Reno: [phone]  - Dr. Katrinka Blazing: [phone]   En caso de inclemencias del tiempo, por favor llame a Lacy Duverney principal al [phone] para una actualizacin sobre el [place] de cualquier retraso o cierre.  Consejos para la medicacin en dermatologa: Por favor, guarde las cajas en las que vienen los medicamentos de uso tpico para ayudarle a seguir las instrucciones sobre dnde y cmo usarlos. Las farmacias generalmente imprimen las instrucciones del medicamento slo en las cajas y no directamente en los tubos del [place].   Si su medicamento es muy caro, por favor, pngase en contacto con Rolm Gala llamando al [phone] y presione la opcin 4 o envenos un mensaje a travs de Clinical cytogeneticist.   No podemos decirle cul ser su copago por los  medicamentos por adelantado ya que esto es diferente dependiendo de la cobertura de su seguro. Sin embargo, es posible que podamos encontrar un medicamento sustituto a Audiological scientist un formulario para que el seguro cubra el medicamento que se considera necesario.   Si se requiere una autorizacin previa para que su compaa de seguros [place] su medicamento, por favor permtanos de 1 a 2 das hbiles para completar [address]  Los precios de los medicamentos varan con frecuencia dependiendo del Environmental consultant de dnde se surte la receta y alguna farmacias pueden ofrecer precios ms baratos.  El sitio web www.goodrx.com tiene cupones para medicamentos de Health and safety inspector. Los precios aqu no tienen en cuenta lo que podra costar con la ayuda del seguro (puede ser ms barato con su seguro), pero el sitio web puede darle el precio si no utiliz Tourist information centre manager.  - Puede imprimir el cupn correspondiente y llevarlo con su receta a la farmacia.  - Tambin puede pasar por nuestra oficina durante el horario de atencin regular y Education officer, museum una tarjeta de cupones de GoodRx.  - Si necesita que su receta se enve electrnicamente a una farmacia diferente, informe a nuestra oficina a travs de MyChart de [place] o por telfono llamando al [phone] y presione la opcin 4.

## 2023-04-26 ENCOUNTER — Ambulatory Visit: Payer: BC Managed Care – PPO | Admitting: Neurology

## 2023-04-27 LAB — SURGICAL PATHOLOGY

## 2023-04-30 ENCOUNTER — Telehealth: Payer: Self-pay

## 2023-04-30 NOTE — Telephone Encounter (Signed)
-----   Message from Willeen Niece sent at 04/30/2023 10:10 AM EST ----- 1. Skin, R mid cheek :       MELANOCYTIC NEVUS, INTRADERMAL TYPE  2. Skin, R mandible :       MELANOCYTIC NEVUS, INTRADERMAL TYPE  3. Skin, R med cheek :       MELANOCYTIC NEVUS, COMPOUND TYPE  4. Skin, R zygoma :       MELANOCYTIC NEVUS, INTRADERMAL TYPE    1-4. All benign moles- please call patient

## 2023-04-30 NOTE — Telephone Encounter (Signed)
 Patient informed of pathology results

## 2023-04-30 NOTE — Telephone Encounter (Addendum)
-  Tried calling patient regarding results. No answer. LM for patient to return call.    ---- Message from Willeen Niece sent at 04/30/2023 10:10 AM EST ----- 1. Skin, R mid cheek :       MELANOCYTIC NEVUS, INTRADERMAL TYPE  2. Skin, R mandible :       MELANOCYTIC NEVUS, INTRADERMAL TYPE  3. Skin, R med cheek :       MELANOCYTIC NEVUS, COMPOUND TYPE  4. Skin, R zygoma :       MELANOCYTIC NEVUS, INTRADERMAL TYPE    1-4. All benign moles- please call patient

## 2023-05-02 ENCOUNTER — Ambulatory Visit: Payer: BC Managed Care – PPO | Admitting: Dermatology

## 2023-05-02 DIAGNOSIS — D492 Neoplasm of unspecified behavior of bone, soft tissue, and skin: Secondary | ICD-10-CM

## 2023-05-02 DIAGNOSIS — D225 Melanocytic nevi of trunk: Secondary | ICD-10-CM | POA: Diagnosis not present

## 2023-05-02 NOTE — Progress Notes (Signed)
 Follow-Up Visit   Subjective  Jillian Edwards is a 58 y.o. female who presents for the following: here for shave removal to nevi at left clavicle, upper abdomen x2, and right inframammary   The patient has spots, moles and lesions to be evaluated, some may be new or changing and the patient may have concern these could be cancer.   The following portions of the chart were reviewed this encounter and updated as appropriate: medications, allergies, medical history  Review of Systems:  No other skin or systemic complaints except as noted in HPI or Assessment and Plan.  Objective  Well appearing patient in no apparent distress; mood and affect are within normal limits.  Relevant exam findings are noted in the Assessment and Plan.  L clavicle 6.0 mm pink flesh papule Central upper abdomen superior 4.0 mm pink flesh papule Central upper abdomen inferior 3.0 mm brown papule R inframammary 6.0 mm brown papule  Assessment & Plan   NEOPLASM OF SKIN (4) L clavicle Epidermal / dermal shaving  Lesion diameter (cm):  0.6 Informed consent: discussed and consent obtained   Patient was prepped and draped in usual sterile fashion: area prepped with alcohol. Anesthesia: the lesion was anesthetized in a standard fashion   Anesthetic:  1% lidocaine  w/ epinephrine  1-100,000 buffered w/ 8.4% NaHCO3 Instrument used: flexible razor blade   Hemostasis achieved with: pressure, aluminum chloride and electrodesiccation   Outcome: patient tolerated procedure well   Post-procedure details: wound care instructions given   Post-procedure details comment:  Ointment and small bandage applied Specimen 1 - Surgical pathology Differential Diagnosis: D48.5 irritated nevus r/o dysplasia Check Margins: No Central upper abdomen superior Epidermal / dermal shaving  Lesion diameter (cm):  0.4 Informed consent: discussed and consent obtained   Patient was prepped and draped in usual sterile fashion: area  prepped with alcohol. Anesthesia: the lesion was anesthetized in a standard fashion   Anesthetic:  1% lidocaine  w/ epinephrine  1-100,000 buffered w/ 8.4% NaHCO3 Instrument used: flexible razor blade   Hemostasis achieved with: pressure, aluminum chloride and electrodesiccation   Outcome: patient tolerated procedure well   Post-procedure details: wound care instructions given   Post-procedure details comment:  Ointment and small bandage applied Specimen 2 - Surgical pathology Differential Diagnosis: D48.5 irritated nevus r/o dysplasia Check Margins: No Central upper abdomen inferior Epidermal / dermal shaving  Lesion diameter (cm):  0.3 Informed consent: discussed and consent obtained   Patient was prepped and draped in usual sterile fashion: area prepped with alcohol. Anesthesia: the lesion was anesthetized in a standard fashion   Anesthetic:  1% lidocaine  w/ epinephrine  1-100,000 buffered w/ 8.4% NaHCO3 Instrument used: flexible razor blade   Hemostasis achieved with: pressure, aluminum chloride and electrodesiccation   Outcome: patient tolerated procedure well   Post-procedure details: wound care instructions given   Post-procedure details comment:  Ointment and small bandage applied Specimen 3 - Surgical pathology Differential Diagnosis: D48.5 irritated nevus r/o dysplasia Check Margins: No R inframammary Epidermal / dermal shaving  Lesion diameter (cm):  0.6 Informed consent: discussed and consent obtained   Patient was prepped and draped in usual sterile fashion: area prepped with alcohol. Anesthesia: the lesion was anesthetized in a standard fashion   Anesthetic:  1% lidocaine  w/ epinephrine  1-100,000 buffered w/ 8.4% NaHCO3 Instrument used: flexible razor blade   Hemostasis achieved with: pressure, aluminum chloride and electrodesiccation   Outcome: patient tolerated procedure well   Post-procedure details: wound care instructions given   Post-procedure details comment:  Ointment and small bandage applied Specimen 4 - Surgical pathology Differential Diagnosis: D48.5 irritated nevus r/o dysplasia Check Margins: No  Return if symptoms worsen or fail to improve.  I, Jacquelynn Vera, CMA, am acting as scribe for Rexene Rattler, MD .   Documentation: I have reviewed the above documentation for accuracy and completeness, and I agree with the above.  Rexene Rattler, MD

## 2023-05-02 NOTE — Patient Instructions (Addendum)

## 2023-05-03 ENCOUNTER — Encounter: Payer: Self-pay | Admitting: Family Medicine

## 2023-05-08 ENCOUNTER — Telehealth: Payer: Self-pay

## 2023-05-08 LAB — SURGICAL PATHOLOGY

## 2023-05-08 NOTE — Telephone Encounter (Signed)
Left pt msg to call for bx result/sh

## 2023-05-08 NOTE — Telephone Encounter (Signed)
-----   Message from Willeen Niece sent at 05/08/2023  5:37 PM EST ----- 1. Skin, L clavicle :       MELANOCYTIC NEVUS, INTRADERMAL TYPE, IRRITATED   2. Skin, central upper abdomen superior :       MELANOCYTIC NEVUS, INTRADERMAL TYPE, IRRITATED   3. Skin, central upper abdomen inferior :       MELANOCYTIC NEVUS, INTRADERMAL TYPE, IRRITATED   4. Skin, R inframammary :       MELANOCYTIC NEVUS, INTRADERMAL TYPE, IRRITATED    1-4 all benign irritated moles - please call patient

## 2023-05-09 NOTE — Telephone Encounter (Signed)
Left pt another message to call for bx results./sh

## 2023-05-10 ENCOUNTER — Telehealth: Payer: BC Managed Care – PPO | Admitting: Family Medicine

## 2023-05-10 ENCOUNTER — Encounter: Payer: Self-pay | Admitting: Family Medicine

## 2023-05-10 VITALS — BP 127/85 | Ht 66.14 in | Wt 172.0 lb

## 2023-05-10 DIAGNOSIS — F411 Generalized anxiety disorder: Secondary | ICD-10-CM

## 2023-05-10 DIAGNOSIS — I10 Essential (primary) hypertension: Secondary | ICD-10-CM

## 2023-05-10 DIAGNOSIS — F909 Attention-deficit hyperactivity disorder, unspecified type: Secondary | ICD-10-CM

## 2023-05-10 DIAGNOSIS — Z6827 Body mass index (BMI) 27.0-27.9, adult: Secondary | ICD-10-CM

## 2023-05-10 DIAGNOSIS — J309 Allergic rhinitis, unspecified: Secondary | ICD-10-CM

## 2023-05-10 DIAGNOSIS — Z833 Family history of diabetes mellitus: Secondary | ICD-10-CM

## 2023-05-10 DIAGNOSIS — F321 Major depressive disorder, single episode, moderate: Secondary | ICD-10-CM | POA: Diagnosis not present

## 2023-05-10 DIAGNOSIS — Z8673 Personal history of transient ischemic attack (TIA), and cerebral infarction without residual deficits: Secondary | ICD-10-CM

## 2023-05-10 DIAGNOSIS — R519 Headache, unspecified: Secondary | ICD-10-CM

## 2023-05-10 DIAGNOSIS — M5432 Sciatica, left side: Secondary | ICD-10-CM

## 2023-05-10 DIAGNOSIS — E663 Overweight: Secondary | ICD-10-CM

## 2023-05-10 MED ORDER — BUPROPION HCL ER (XL) 150 MG PO TB24: ORAL_TABLET | ORAL | 1 refills | Status: DC

## 2023-05-10 MED ORDER — AMPHETAMINE-DEXTROAMPHETAMINE 20 MG PO TABS: 30.0000 mg | ORAL_TABLET | Freq: Two times a day (BID) | ORAL | 0 refills | Status: DC

## 2023-05-10 MED ORDER — VALSARTAN 80 MG PO TABS
80.0000 mg | ORAL_TABLET | Freq: Every day | ORAL | 3 refills | Status: DC
Start: 2023-05-10 — End: 2023-08-02

## 2023-05-10 MED ORDER — PROPRANOLOL HCL ER 60 MG PO CP24
60.0000 mg | ORAL_CAPSULE | Freq: Every day | ORAL | 1 refills | Status: DC
Start: 2023-05-10 — End: 2023-08-02

## 2023-05-10 MED ORDER — GABAPENTIN 100 MG PO CAPS
100.0000 mg | ORAL_CAPSULE | Freq: Every day | ORAL | 3 refills | Status: DC
Start: 2023-05-10 — End: 2023-08-02

## 2023-05-10 MED ORDER — IBUPROFEN 600 MG PO TABS
600.0000 mg | ORAL_TABLET | Freq: Three times a day (TID) | ORAL | 1 refills | Status: AC | PRN
Start: 2023-05-10 — End: ?

## 2023-05-10 MED ORDER — MONTELUKAST SODIUM 10 MG PO TABS: 10.0000 mg | ORAL_TABLET | Freq: Every day | ORAL | 1 refills | Status: DC

## 2023-05-10 MED ORDER — PAROXETINE HCL 40 MG PO TABS: 40.0000 mg | ORAL_TABLET | ORAL | 0 refills | Status: DC

## 2023-05-10 MED ORDER — ALPRAZOLAM 0.5 MG PO TABS: 0.5000 mg | ORAL_TABLET | ORAL | 2 refills | Status: DC | PRN

## 2023-05-10 NOTE — Progress Notes (Addendum)
Virtual Visit via Video Note  I connected with Jenaye G Arreguin on 05/10/23 at  8:50 AM EST by a video enabled telemedicine application and verified that I am speaking with the correct person using two identifiers.  Patient Location: Home Provider Location: Office/clinic  I discussed the limitations, risks, security, and privacy concerns of performing an evaluation and management service by video and the availability of in person appointments. I also discussed with the patient that there may be a patient responsible charge related to this service. The patient expressed understanding and agreed to proceed.    Subjective   Patient ID: Starr Lake, female    DOB: 09-21-1965  Age: 58 y.o. MRN: [id number]  Chief Complaint  Patient presents with   Hypertension   Anxiety   Depression   ADHD    HPI Kenedy LIVIAH CAKE is a 58 y.o. female presenting today for follow up of hypertension, mood. Hypertension: Patient here for follow-up of elevated blood pressure. She is exercising and is adherent to low salt diet.   Pt denies chest pain, SOB, dizziness, edema, syncope, fatigue or heart palpitations. Taking valsartan, propranolol, and she endorses taking half tablet of hydrochlorothiazide which helps with peripheral edema, reports excellent compliance with treatment. Denies side effects. Mood: Patient is here to follow up for depression and anxiety, currently managing with Paxil 30 mg daily and Wellbutrin 150 g daily.  She also has Xanax 0.5 mg to take as needed for breakthrough anxiety. Taking medication without side effects, reports excellent compliance with treatment. Denies mood changes or SI/HI.  With the increased stress of taking care of her ex-husband while he undergoes treatment for cancer, her anxiety has been significantly higher even with the recently increased dose of Paxil.  She does have a good support system of people that she can talk to and lean on for support.  She finds that  consistently exercising has also been very helpful for her.  She is interested in potentially increasing her Paxil dose even if it is only temporary for a few months.     05/10/2023    9:16 AM 02/07/2023    9:01 AM 12/05/2022    9:57 AM  Depression screen PHQ 2/9  Decreased Interest 3 2 2   Down, Depressed, Hopeless 3 2 2   PHQ - 2 Score 6 4 4   Altered sleeping 2 2 3   Tired, decreased energy 3 2 3   Change in appetite 2 2 3   Feeling bad or failure about yourself  0 2 0  Trouble concentrating 3 3 3   Moving slowly or fidgety/restless 3 3 2   Suicidal thoughts 0 0 0  PHQ-9 Score 19 18 18   Difficult doing work/chores  Very difficult Very difficult       05/10/2023    9:29 AM 02/07/2023    9:01 AM 12/05/2022    9:57 AM 05/26/2022    9:05 AM  GAD 7 : Generalized Anxiety Score  Nervous, Anxious, on Edge 3 2 0 3  Control/stop worrying 3 3 3 3   Worry too much - different things 3 3 3 3   Trouble relaxing 3 3 3 3   Restless 2 3 2 2   Easily annoyed or irritable 0 3 2 3   Afraid - awful might happen 1 0 1 0  Total GAD 7 Score 15 17 14 17   Anxiety Difficulty  Very difficult Very difficult      ROS Negative unless otherwise noted in HPI  Outpatient Medications Prior to Visit  Medication Sig   aspirin EC 81 MG tablet Take 81 mg by mouth daily.   azelastine (ASTELIN) 0.1 % nasal spray Place 1 spray into both nostrils 2 (two) times daily. Use in each nostril as directed   calcipotriene (DOVONOX) 0.005 % cream Apply topically 3 (three) times a week.   cholecalciferol (VITAMIN D) 1000 units tablet Take 2,000 Units by mouth daily.    hydrochlorothiazide (HYDRODIURIL) 12.5 MG tablet SMARTSIG:1.0 Tablet(s) By Mouth Daily   Tapinarof (VTAMA) 1 % CREA Apply once daily to affected areas on face.   Ubrogepant (UBRELVY) 100 MG TABS Take 1 tablet (100 mg total) by mouth 2 (two) times daily as needed. Dx: G43.909, G44.52   vitamin E 180 MG (400 UNITS) capsule Take 1 capsule by mouth daily.   WEGOVY 0.25  MG/0.5ML SOAJ Inject 0.25 mg into the skin once a week. Use this dose for 1 month (4 shots) and then increase to next higher dose.   [DISCONTINUED] ALPRAZolam (XANAX) 0.5 MG tablet Take 1 tablet (0.5 mg total) by mouth 2 (two) times daily as needed for anxiety.   [DISCONTINUED] amphetamine-dextroamphetamine (ADDERALL) 20 MG tablet Take 1.5 tablets (30 mg total) by mouth 2 (two) times daily.   [DISCONTINUED] amphetamine-dextroamphetamine (ADDERALL) 20 MG tablet Take 1.5 tablets (30 mg total) by mouth 2 (two) times daily.   [DISCONTINUED] buPROPion (WELLBUTRIN XL) 150 MG 24 hr tablet Take 1 tablet po QD   [DISCONTINUED] gabapentin (NEURONTIN) 100 MG capsule Take 1-3 capsules (100-300 mg total) by mouth at bedtime.   [DISCONTINUED] ibuprofen (ADVIL) 600 MG tablet Take 1 tablet (600 mg total) by mouth every 8 (eight) hours as needed.   [DISCONTINUED] montelukast (SINGULAIR) 10 MG tablet Take 1 tablet (10 mg total) by mouth at bedtime.   [DISCONTINUED] PARoxetine (PAXIL) 30 MG tablet TAKE 1 TABLET BY MOUTH ONCE DAILY   [DISCONTINUED] propranolol ER (INDERAL LA) 60 MG 24 hr capsule Take 1 capsule (60 mg total) by mouth daily.   [DISCONTINUED] valsartan (DIOVAN) 80 MG tablet Take 1 tablet (80 mg total) by mouth daily.   [DISCONTINUED] Vitamin D, Ergocalciferol, (DRISDOL) 1.25 MG (50000 UNIT) CAPS capsule TAKE 1 CAPSULE BY MOUTH ONCE A WEEK   No facility-administered medications prior to visit.     Objective:    BP 127/85   Ht 5' 6.14" (1.68 m)   Wt 172 lb (78 kg)   LMP 05/06/2018 (Approximate)   BMI 27.64 kg/m    Physical Exam General: Speaking clearly in complete sentences without any shortness of breath.  Alert and oriented x3.  Normal judgment. No apparent acute distress.   Assessment & Plan:  Essential hypertension Assessment & Plan: BP goal <140/90. Stable, at goal.  Continue valsartan 80 mg daily, hydrochlorothiazide 6.25 mg daily, continue propranolol 60 mg daily. Will continue to  monitor.  Orders: -     Propranolol HCl ER; Take 1 capsule (60 mg total) by mouth daily.  Dispense: 90 capsule; Refill: 1 -     Valsartan; Take 1 tablet (80 mg total) by mouth daily.  Dispense: 90 tablet; Refill: 3  GAD (generalized anxiety disorder) Assessment & Plan: Due to elevated anxiety, increase Paxil to 40 mg daily.  We discussed that the maximum dose that we can work towards this 50 mg daily during this time of elevated stress and anxiety, ultimately planning on any increase to be temporary and eventually working back towards her baseline dose of 20 mg daily.  May take Xanax 0.5 mg as needed  for breakthrough anxiety, provided refill today.  Will continue to monitor.  Orders: -     PARoxetine HCl; Take 1 tablet (40 mg total) by mouth every morning.  Dispense: 90 tablet; Refill: 0 -     ALPRAZolam; Take 1 tablet (0.5 mg total) by mouth as needed for anxiety. Maximum twice daily  Dispense: 20 tablet; Refill: 2 -     buPROPion HCl ER (XL); Take 1 tablet po QD  Dispense: 90 tablet; Refill: 1  Current moderate episode of major depressive disorder, unspecified whether recurrent (HCC) Assessment & Plan: Increase Paxil to 40 mg daily.  Maximum daily dose 50 mg.  Continue bupropion 150 mg daily.  Will continue to monitor.  Orders: -     PARoxetine HCl; Take 1 tablet (40 mg total) by mouth every morning.  Dispense: 90 tablet; Refill: 0  Attention deficit hyperactivity disorder (ADHD), unspecified ADHD type Assessment & Plan: Stable, well-controlled.  Continue Adderall 20 mg up to twice daily as needed.  In the future, may consider tapering off if needed.  PDMP reviewed, no aberrancies.  Overdose risk score 260.  Will continue to monitor.  Orders: -     Amphetamine-Dextroamphetamine; Take 1.5 tablets (30 mg total) by mouth 2 (two) times daily.  Dispense: 90 tablet; Refill: 0 -     Amphetamine-Dextroamphetamine; Take 1.5 tablets (30 mg total) by mouth 2 (two) times daily.  Dispense: 90  tablet; Refill: 0  Overweight with body mass index (BMI) 25.0-29.9 Assessment & Plan: BMI continues to increase over time in spite of eating in a calorie deficit with high fiber and protein as well as exercising several times a week.  Patient has a history of hypertension and history of stroke putting her at increased risk of cardiovascular events.  Initiating appeal for Wegovy to prevent cardiovascular events.   Chronic allergic rhinitis -     Montelukast Sodium; Take 1 tablet (10 mg total) by mouth at bedtime.  Dispense: 90 tablet; Refill: 1  Left sided sciatica -     Gabapentin; Take 1-3 capsules (100-300 mg total) by mouth at bedtime.  Dispense: 90 capsule; Refill: 3 -     Ibuprofen; Take 1 tablet (600 mg total) by mouth every 8 (eight) hours as needed.  Dispense: 90 tablet; Refill: 1  She will be establishing with a new primary care provider in [place] to be closer to home since driving is very nerve-racking for her.  Providing enough refills to get her through her upcoming appointment on 08/02/2023 when the new PCP will take over.  No follow-ups on file.   I discussed the assessment and treatment plan with the patient. The patient was provided an opportunity to ask questions, and all were answered. The patient agreed with the plan and demonstrated an understanding of the instructions.   The patient was advised to call back or seek an in-person evaluation if the symptoms worsen or if the condition fails to improve as anticipated.  The above assessment and management plan was discussed with the patient. The patient verbalized understanding of and has agreed to the management plan.   Melida Quitter, PA

## 2023-05-10 NOTE — Assessment & Plan Note (Signed)
Increase Paxil to 40 mg daily.  Maximum daily dose 50 mg.  Continue bupropion 150 mg daily.  Will continue to monitor.

## 2023-05-10 NOTE — Telephone Encounter (Signed)
Left pt msg to call for bx result/sh

## 2023-05-10 NOTE — Assessment & Plan Note (Signed)
Stable, well-controlled.  Continue Adderall 20 mg up to twice daily as needed.  In the future, may consider tapering off if needed.  PDMP reviewed, no aberrancies.  Overdose risk score 260.  Will continue to monitor.

## 2023-05-10 NOTE — Assessment & Plan Note (Signed)
BP goal <140/90. Stable, at goal.  Continue valsartan 80 mg daily, hydrochlorothiazide 6.25 mg daily, continue propranolol 60 mg daily. Will continue to monitor.

## 2023-05-10 NOTE — Assessment & Plan Note (Signed)
Due to elevated anxiety, increase Paxil to 40 mg daily.  We discussed that the maximum dose that we can work towards this 50 mg daily during this time of elevated stress and anxiety, ultimately planning on any increase to be temporary and eventually working back towards her baseline dose of 20 mg daily.  May take Xanax 0.5 mg as needed for breakthrough anxiety, provided refill today.  Will continue to monitor.

## 2023-05-10 NOTE — Assessment & Plan Note (Signed)
BMI continues to increase over time in spite of eating in a calorie deficit with high fiber and protein as well as exercising several times a week.  Patient has a history of hypertension and history of stroke putting her at increased risk of cardiovascular events.  Initiating appeal for Wegovy to prevent cardiovascular events.

## 2023-05-16 MED ORDER — WEGOVY 0.25 MG/0.5ML ~~LOC~~ SOAJ: 0.2500 mg | SUBCUTANEOUS | 0 refills | Status: DC

## 2023-05-16 MED ORDER — UBRELVY 100 MG PO TABS: 1.0000 | ORAL_TABLET | Freq: Two times a day (BID) | ORAL | 3 refills | Status: DC | PRN

## 2023-05-16 NOTE — Addendum Note (Signed)
Addended by: Saralyn Pilar on: 05/16/2023 09:35 AM   Modules accepted: Orders

## 2023-05-29 ENCOUNTER — Ambulatory Visit: Payer: BC Managed Care – PPO | Admitting: Neurology

## 2023-06-15 DIAGNOSIS — M5416 Radiculopathy, lumbar region: Secondary | ICD-10-CM | POA: Diagnosis not present

## 2023-06-20 DIAGNOSIS — D2362 Other benign neoplasm of skin of left upper limb, including shoulder: Secondary | ICD-10-CM | POA: Diagnosis not present

## 2023-06-20 DIAGNOSIS — D235 Other benign neoplasm of skin of trunk: Secondary | ICD-10-CM | POA: Diagnosis not present

## 2023-06-20 DIAGNOSIS — H01134 Eczematous dermatitis of left upper eyelid: Secondary | ICD-10-CM | POA: Diagnosis not present

## 2023-06-20 DIAGNOSIS — D2262 Melanocytic nevi of left upper limb, including shoulder: Secondary | ICD-10-CM | POA: Diagnosis not present

## 2023-06-20 DIAGNOSIS — D225 Melanocytic nevi of trunk: Secondary | ICD-10-CM | POA: Diagnosis not present

## 2023-06-20 DIAGNOSIS — H01131 Eczematous dermatitis of right upper eyelid: Secondary | ICD-10-CM | POA: Diagnosis not present

## 2023-06-20 DIAGNOSIS — D485 Neoplasm of uncertain behavior of skin: Secondary | ICD-10-CM | POA: Diagnosis not present

## 2023-07-09 ENCOUNTER — Other Ambulatory Visit: Payer: Self-pay | Admitting: Family Medicine

## 2023-07-09 DIAGNOSIS — F909 Attention-deficit hyperactivity disorder, unspecified type: Secondary | ICD-10-CM

## 2023-07-09 DIAGNOSIS — F411 Generalized anxiety disorder: Secondary | ICD-10-CM

## 2023-07-09 DIAGNOSIS — F321 Major depressive disorder, single episode, moderate: Secondary | ICD-10-CM

## 2023-07-09 MED ORDER — AMPHETAMINE-DEXTROAMPHETAMINE 20 MG PO TABS: 30.0000 mg | ORAL_TABLET | Freq: Two times a day (BID) | ORAL | 0 refills | Status: DC

## 2023-08-02 ENCOUNTER — Encounter: Payer: Self-pay | Admitting: Nurse Practitioner

## 2023-08-02 ENCOUNTER — Ambulatory Visit: Payer: BC Managed Care – PPO | Admitting: Nurse Practitioner

## 2023-08-02 VITALS — BP 116/72 | HR 68 | Temp 98.3°F | Ht 66.0 in | Wt 168.8 lb

## 2023-08-02 DIAGNOSIS — F321 Major depressive disorder, single episode, moderate: Secondary | ICD-10-CM | POA: Diagnosis not present

## 2023-08-02 DIAGNOSIS — F5104 Psychophysiologic insomnia: Secondary | ICD-10-CM

## 2023-08-02 DIAGNOSIS — Z8673 Personal history of transient ischemic attack (TIA), and cerebral infarction without residual deficits: Secondary | ICD-10-CM

## 2023-08-02 DIAGNOSIS — F909 Attention-deficit hyperactivity disorder, unspecified type: Secondary | ICD-10-CM

## 2023-08-02 DIAGNOSIS — F411 Generalized anxiety disorder: Secondary | ICD-10-CM | POA: Diagnosis not present

## 2023-08-02 DIAGNOSIS — I10 Essential (primary) hypertension: Secondary | ICD-10-CM

## 2023-08-02 DIAGNOSIS — J309 Allergic rhinitis, unspecified: Secondary | ICD-10-CM

## 2023-08-02 DIAGNOSIS — M5432 Sciatica, left side: Secondary | ICD-10-CM

## 2023-08-02 DIAGNOSIS — G43809 Other migraine, not intractable, without status migrainosus: Secondary | ICD-10-CM

## 2023-08-02 MED ORDER — MONTELUKAST SODIUM 10 MG PO TABS: 10.0000 mg | ORAL_TABLET | Freq: Every day | ORAL | 3 refills | Status: AC

## 2023-08-02 MED ORDER — AMPHETAMINE-DEXTROAMPHETAMINE 20 MG PO TABS: 30.0000 mg | ORAL_TABLET | Freq: Two times a day (BID) | ORAL | 0 refills | Status: DC

## 2023-08-02 MED ORDER — PROPRANOLOL HCL ER 60 MG PO CP24: 60.0000 mg | ORAL_CAPSULE | Freq: Every day | ORAL | 3 refills | Status: AC

## 2023-08-02 MED ORDER — BUPROPION HCL ER (XL) 150 MG PO TB24: ORAL_TABLET | ORAL | 3 refills | Status: DC

## 2023-08-02 MED ORDER — ALPRAZOLAM 0.5 MG PO TABS: 0.5000 mg | ORAL_TABLET | Freq: Two times a day (BID) | ORAL | 2 refills | Status: DC | PRN

## 2023-08-02 MED ORDER — VALSARTAN 80 MG PO TABS: 80.0000 mg | ORAL_TABLET | Freq: Every day | ORAL | 3 refills | Status: AC

## 2023-08-02 MED ORDER — PAROXETINE HCL 40 MG PO TABS: 40.0000 mg | ORAL_TABLET | ORAL | 3 refills | Status: AC

## 2023-08-02 MED ORDER — HYDROCHLOROTHIAZIDE 12.5 MG PO TABS: 12.5000 mg | ORAL_TABLET | Freq: Every day | ORAL | 3 refills | Status: AC

## 2023-08-02 NOTE — Assessment & Plan Note (Signed)
 Depression persists despite Paxil  40 mg, with symptoms of crying, stress, and insomnia. Restart Wellbutrin  XL 150 mg daily alongside Paxil  to improve mood. Continue Paxil  40 mg daily. Counseled patient on common side effects. Encouraged to contact if worsening symptoms, unusual behavior changes or suicidal thoughts occur. PHQ- 17 today.

## 2023-08-02 NOTE — Progress Notes (Signed)
 Bluford Burkitt, NP-C Phone: [phone]  Jillian Edwards is a 58 y.o. female who presents today to establish care.   Discussed the use of AI scribe software for clinical note transcription with the patient, who gave verbal consent to proceed.  History of Present Illness   Jillian Edwards is a 58 year old female with hypertension and a history of stroke who presents for medication management and evaluation of mood and sleep disturbances.  She has hypertension managed with hydrochlorothiazide  12.5 mg, valsartan  80 mg, and propranolol , which also serves as migraine prophylaxis. Her blood pressure is well-controlled, but she experiences ankle swelling, particularly in the summer, and has a high salt intake.  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 She lost peripheral vision following the stroke and had to relearn how to drive. Ongoing issues with vision necessitate the use of cars with blind spot detection and backup cameras. She takes an aspirin daily for stroke prevention.  She experiences migraines and has consulted several specialists for management. Propranolol  is used for migraine prophylaxis, and Ubrelvy  is taken as needed for acute migraine attacks.  She reports significant mood disturbances, including crying spells and stress, attributed to premenopause and personal stressors, such as caring for her ex-husband with stage four cancer. She is on Paxil , possibly 40 mg, and has previously been on Wellbutrin . She is dissatisfied with current mood management and has difficulty sleeping, often waking up after two hours of sleep. She has used CBD gummies to aid sleep but is unsure if they contain THC. A prescription for Xanax  is used sparingly for panic attacks.  She has ADHD and takes Adderall 20 mg, one and a half tablets in the morning and one and a half after lunch. Difficulty sleeping is  attributed to mood and anxiety rather than Adderall.  She has a history of back pain diagnosed as arthritis, with symptoms of left-sided sciatica. Gabapentin  was previously prescribed but was ineffective and caused drowsiness. It is not taken regularly.  She has sinus issues and takes medication at night as advised by an ENT specialist. She experiences tension headaches attributed to stress.  She takes vitamin D  and E supplements. Low vitamin D  levels are noted, particularly in the winter.      Active Ambulatory Problems    Diagnosis Date Noted   Incontinence 08/01/2015   Cystocele, grade 2 08/01/2015   Essential hypertension    Herpes genitalis    Depression    ADD (attention deficit disorder) 05/04/2017   GAD (generalized anxiety disorder) 05/04/2017   Edema of both feet 08/02/2017   Unspecified menopausal and perimenopausal disorder 01/26/2018   History of stroke 06/03/2018   Chronic pansinusitis 06/17/2018   Migraine 06/03/2018   Primary insomnia 10/22/2018   Hypercalcemia 11/16/2019   Chronic allergic rhinitis 11/15/2020   Vitamin D  deficiency 02/20/2021   Overweight with body mass index (BMI) of 27 to 27.9 in adult 02/20/2021   Left sided sciatica 05/23/2021   Resolved Ambulatory Problems    Diagnosis Date Noted   Microscopic hematuria 08/01/2015   Urinary frequency 08/01/2015   Gastroenteritis 07/23/2017   Diarrhea of infectious origin 07/23/2017   Acute recurrent pansinusitis 08/02/2017   Other fatigue 01/26/2018   Encounter for long-term (current) use of medications 10/22/2018   Acute otitis externa of left ear 04/26/2019   Encounter for general adult medical examination with abnormal findings 02/03/2020  Encounter for long-term (current) use of high-risk medication 02/03/2020   Encounter for screening mammogram for malignant neoplasm of breast 02/03/2020   Dysuria 02/03/2020   Encounter to establish care 11/15/2020   Past Medical History:  Diagnosis Date    Allergy Not sure   Anxiety 2006   Arthritis 2025   Dysplastic nevus 11/07/2017   Dysplastic nevus 10/14/2018   Dysplastic nevus 04/24/2019   Frequent urination    GERD (gastroesophageal reflux disease) 2025   Headache    Hypertension 2005   Stress headaches    Stroke (HCC) ~2005   Stroke or transient ischemic attack (TIA) diagnosed during current admission    Urological disorder     Family History  Problem Relation Age of Onset   Hypertension Father    Diabetes Father        Has also had an amputation   Heart attack Father    Diabetes Mellitus II Father    Obesity Father    Diabetes Mellitus II Brother    Hypertension Brother    Alcohol abuse Brother    Anxiety disorder Brother    Depression Brother    Drug abuse Brother    Obesity Brother    Breast cancer Maternal Grandmother 60   Diabetes Mellitus II Paternal Grandmother    ADD / ADHD Sister    Obesity Sister    Varicose Veins Sister     Social History   Socioeconomic History   Marital status: Legally Separated    Spouse name: Not on file   Number of children: 1   Years of education: 12   Highest education level: 12th grade  Occupational History   Occupation: Curator  Tobacco Use   Smoking status: Never    Passive exposure: Never   Smokeless tobacco: Never   Tobacco comments:    I dont smoke and never have.  Vaping Use   Vaping status: Never Used  Substance and Sexual Activity   Alcohol use: No   Drug use: No   Sexual activity: Not Currently    Birth control/protection: Post-menopausal    Comment: Mirena  Other Topics Concern   Not on file  Social History Narrative   Not on file   Social Drivers of Health   Financial Resource Strain: Low Risk  (07/26/2023)   Overall Financial Resource Strain (CARDIA)    Difficulty of Paying Living Expenses: Not very hard  Food Insecurity: No Food Insecurity (07/26/2023)   Hunger Vital Sign    Worried About Running Out of Food in the Last Year:  Never true    Ran Out of Food in the Last Year: Never true  Transportation Needs: No Transportation Needs (07/26/2023)   PRAPARE - Administrator, Civil Service (Medical): No    Lack of Transportation (Non-Medical): No  Physical Activity: Insufficiently Active (07/26/2023)   Exercise Vital Sign    Days of Exercise per Week: 3 days    Minutes of Exercise per Session: 30 min  Stress: Stress Concern Present (07/26/2023)   [employer] of Occupational Health - Occupational Stress Questionnaire    Feeling of Stress : To some extent  Social Connections: Socially Isolated (07/26/2023)   Social Connection and Isolation Panel [NHANES]    Frequency of Communication with Friends and Family: More than three times a week    Frequency of Social Gatherings with Friends and Family: Once a week    Attends Religious Services: Never    Database administrator or  Organizations: No    Attends Engineer, structural: Not on file    Marital Status: Separated  Intimate Partner Violence: Not on file    ROS  General:  Negative for unexplained weight loss, fever Skin: Negative for new or changing mole, sore that won't heal HEENT: Negative for trouble hearing, trouble seeing, ringing in ears, mouth sores, hoarseness, change in voice, dysphagia. CV:  Negative for chest pain, dyspnea, edema, palpitations Resp: Negative for cough, dyspnea, hemoptysis GI: Negative for nausea, vomiting, diarrhea, constipation, abdominal pain, melena, hematochezia. GU: Negative for dysuria, incontinence, urinary hesitance, hematuria, vaginal or penile discharge, polyuria, sexual difficulty, lumps in testicle or breasts MSK: Negative for muscle cramps or aches, joint pain or swelling Neuro: Negative for headaches, weakness, numbness, dizziness, passing out/fainting Psych: Negative for memory problems  Objective  Physical Exam Vitals:   08/02/23 1358  BP: 116/72  Pulse: 68  Temp: 98.3 F (36.8 C)  SpO2: 96%     BP Readings from Last 3 Encounters:  08/02/23 116/72  05/10/23 127/85  02/07/23 137/85   Wt Readings from Last 3 Encounters:  08/02/23 168 lb 12.8 oz (76.6 kg)  05/10/23 172 lb (78 kg)  02/07/23 166 lb (75.3 kg)    Physical Exam Constitutional:      General: She is not in acute distress.    Appearance: Normal appearance.  HENT:     Head: Normocephalic.  Cardiovascular:     Rate and Rhythm: Normal rate and regular rhythm.     Heart sounds: Normal heart sounds.  Pulmonary:     Effort: Pulmonary effort is normal.     Breath sounds: Normal breath sounds.  Skin:    General: Skin is warm and dry.  Neurological:     General: No focal deficit present.     Mental Status: She is alert.  Psychiatric:        Mood and Affect: Mood normal.        Behavior: Behavior normal.      Assessment/Plan:   Current moderate episode of major depressive disorder, unspecified whether recurrent (HCC) Assessment & Plan: Depression persists despite Paxil  40 mg, with symptoms of crying, stress, and insomnia. Restart Wellbutrin  XL 150 mg daily alongside Paxil  to improve mood. Continue Paxil  40 mg daily. Counseled patient on common side effects. Encouraged to contact if worsening symptoms, unusual behavior changes or suicidal thoughts occur. PHQ- 17 today.  Orders: -     buPROPion  HCl ER (XL); Take 1 tablet po QD  Dispense: 90 tablet; Refill: 3 -     PARoxetine  HCl; Take 1 tablet (40 mg total) by mouth every morning.  Dispense: 90 tablet; Refill: 3  GAD (generalized anxiety disorder) Assessment & Plan: Currently on Paxil  40 mg daily. She does have Xanax  0.5 mg for as needed use, but rarely takes. She has an increase in life stressors, crying and difficulty sleeping. Discussed using her Xanax  at bedtime as needed for sleep. Refill provided today. PDMP reviewed. Check CBD gummies for THC content. GAD- 19 today.  Orders: -     ALPRAZolam ; Take 1 tablet (0.5 mg total) by mouth 2 (two) times daily  as needed for sleep or anxiety.  Dispense: 60 tablet; Refill: 2  Attention deficit hyperactivity disorder (ADHD), unspecified ADHD type Assessment & Plan: ADHD is well controlled with Adderall 30 mg twice daily. No adverse side effects reports. Continue. Discussed tapering down dose. Refills provided today. Non-Opioid Controlled Substance Agreement signed today in office. PDMP reviewed.  Orders: -     Amphetamine -Dextroamphetamine ; Take 1.5 tablets (30 mg total) by mouth 2 (two) times daily.  Dispense: 90 tablet; Refill: 0 -     Amphetamine -Dextroamphetamine ; Take 1.5 tablets (30 mg total) by mouth 2 (two) times daily.  Dispense: 90 tablet; Refill: 0 -     Amphetamine -Dextroamphetamine ; Take 1.5 tablets (30 mg total) by mouth 2 (two) times daily.  Dispense: 90 tablet; Refill: 0  Essential hypertension Assessment & Plan: Hypertension is well-controlled with current medications. Continue hydrochlorothiazide  12.5 mg daily, valsartan  80 mg daily, and propranolol  as prescribed. Encourage home blood pressure monitoring and advise watching salt intake.  Orders: -     hydroCHLOROthiazide ; Take 1 tablet (12.5 mg total) by mouth daily.  Dispense: 90 tablet; Refill: 3 -     Propranolol  HCl ER; Take 1 capsule (60 mg total) by mouth daily.  Dispense: 90 capsule; Refill: 3 -     Valsartan ; Take 1 tablet (80 mg total) by mouth daily.  Dispense: 90 tablet; Refill: 3  Other migraine without status migrainosus, not intractable Assessment & Plan: Migraines are managed with propranolol  for prophylaxis and Ubrelvy  as needed. Stress and anxiety contribute to migraines. Continue propranolol  for migraine prophylaxis and use Ubrelvy  as needed for acute attacks.  Orders: -     Propranolol  HCl ER; Take 1 capsule (60 mg total) by mouth daily.  Dispense: 90 capsule; Refill: 3  Left sided sciatica Assessment & Plan: Arthritis in the lower back is confirmed by x-ray, with symptoms of pain, stiffness and occasional  left-sided sciatica. Gabapentin  was ineffective, so discontinue it. Consider physical therapy if symptoms worsen. Encourage home exercises and use of a foam roller.   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 Continue aspirin 81 mg daily for stroke prevention.   Psychophysiological insomnia -     ALPRAZolam ; Take 1 tablet (0.5 mg total) by mouth 2 (two) times daily as needed for sleep or anxiety.  Dispense: 60 tablet; Refill: 2  Chronic allergic rhinitis -     Montelukast  Sodium; Take 1 tablet (10 mg total) by mouth at bedtime.  Dispense: 90 tablet; Refill: 3    Return in about 3 months (around 11/02/2023) for ADHD.   Bluford Burkitt, NP-C [facility] [place]

## 2023-08-02 NOTE — Assessment & Plan Note (Signed)
 Migraines are managed with propranolol  for prophylaxis and Ubrelvy  as needed. Stress and anxiety contribute to migraines. Continue propranolol  for migraine prophylaxis and use Ubrelvy  as needed for acute attacks.

## 2023-08-02 NOTE — Assessment & Plan Note (Signed)
 Hypertension is well-controlled with current medications. Continue hydrochlorothiazide  12.5 mg daily, valsartan  80 mg daily, and propranolol  as prescribed. Encourage home blood pressure monitoring and advise watching salt intake.

## 2023-08-02 NOTE — Assessment & Plan Note (Addendum)
 Currently on Paxil  40 mg daily. She does have Xanax  0.5 mg for as needed use, but rarely takes. She has an increase in life stressors, crying and difficulty sleeping. Discussed using her Xanax  at bedtime as needed for sleep. Refill provided today. PDMP reviewed. Check CBD gummies for THC content. GAD- 19 today.

## 2023-08-02 NOTE — Assessment & Plan Note (Signed)
 Arthritis in the lower back is confirmed by x-ray, with symptoms of pain, stiffness and occasional left-sided sciatica. Gabapentin  was ineffective, so discontinue it. Consider physical therapy if symptoms worsen. Encourage home exercises and use of a foam roller.

## 2023-08-02 NOTE — Assessment & Plan Note (Signed)
 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 Continue aspirin 81 mg daily for stroke prevention.

## 2023-08-02 NOTE — Assessment & Plan Note (Signed)
 ADHD is well controlled with Adderall 30 mg twice daily. No adverse side effects reports. Continue. Discussed tapering down dose. Refills provided today. Non-Opioid Controlled Substance Agreement signed today in office. PDMP reviewed.

## 2023-08-22 DIAGNOSIS — D225 Melanocytic nevi of trunk: Secondary | ICD-10-CM | POA: Diagnosis not present

## 2023-08-22 DIAGNOSIS — D2262 Melanocytic nevi of left upper limb, including shoulder: Secondary | ICD-10-CM | POA: Diagnosis not present

## 2023-08-22 DIAGNOSIS — D235 Other benign neoplasm of skin of trunk: Secondary | ICD-10-CM | POA: Diagnosis not present

## 2023-11-02 ENCOUNTER — Ambulatory Visit: Admitting: Nurse Practitioner

## 2023-11-12 ENCOUNTER — Ambulatory Visit: Admitting: Nurse Practitioner

## 2023-11-12 VITALS — BP 128/88 | HR 72 | Temp 98.2°F | Ht 66.0 in | Wt 175.2 lb

## 2023-11-12 DIAGNOSIS — J0100 Acute maxillary sinusitis, unspecified: Secondary | ICD-10-CM | POA: Diagnosis not present

## 2023-11-12 DIAGNOSIS — F909 Attention-deficit hyperactivity disorder, unspecified type: Secondary | ICD-10-CM

## 2023-11-12 DIAGNOSIS — E663 Overweight: Secondary | ICD-10-CM | POA: Diagnosis not present

## 2023-11-12 DIAGNOSIS — G43809 Other migraine, not intractable, without status migrainosus: Secondary | ICD-10-CM

## 2023-11-12 DIAGNOSIS — F419 Anxiety disorder, unspecified: Secondary | ICD-10-CM

## 2023-11-12 DIAGNOSIS — F32A Depression, unspecified: Secondary | ICD-10-CM

## 2023-11-12 DIAGNOSIS — K219 Gastro-esophageal reflux disease without esophagitis: Secondary | ICD-10-CM

## 2023-11-12 DIAGNOSIS — Z6828 Body mass index (BMI) 28.0-28.9, adult: Secondary | ICD-10-CM

## 2023-11-12 MED ORDER — AMPHETAMINE-DEXTROAMPHETAMINE 20 MG PO TABS: 30.0000 mg | ORAL_TABLET | Freq: Two times a day (BID) | ORAL | 0 refills | Status: DC

## 2023-11-12 MED ORDER — WEGOVY 0.5 MG/0.5ML ~~LOC~~ SOAJ
0.5000 mg | SUBCUTANEOUS | 0 refills | Status: DC
Start: 2023-11-12 — End: 2023-12-28

## 2023-11-12 MED ORDER — WEGOVY 0.25 MG/0.5ML ~~LOC~~ SOAJ: 0.2500 mg | SUBCUTANEOUS | 0 refills | Status: DC

## 2023-11-12 MED ORDER — UBRELVY 100 MG PO TABS: 1.0000 | ORAL_TABLET | Freq: Two times a day (BID) | ORAL | 3 refills | Status: AC | PRN

## 2023-11-12 MED ORDER — OMEPRAZOLE 40 MG PO CPDR: 40.0000 mg | DELAYED_RELEASE_CAPSULE | Freq: Every day | ORAL | 3 refills | Status: AC

## 2023-11-12 NOTE — Progress Notes (Signed)
 Leron Glance, NP-C Phone: [phone]  Jillian Edwards is a 58 y.o. female who presents today for follow up.   Discussed the use of AI scribe software for clinical note transcription with the patient, who gave verbal consent to proceed.  History of Present Illness   Jillian Edwards is a 58 year old female who presents for a three-month follow-up visit.  She experiences severe acid reflux, describing it as 'burning' and 'hurting bad.' This is a new symptom and has been worsening. She has burping and heartburn related to her acid reflux. No problems with swallowing, blood in stool, or shortness of breath.  She reports epistaxis, green nasal discharge, and drainage down her throat. She started taking Augmentin  875 mg twice a day yesterday. She has nasal congestion and drainage down the throat. She has a cough at night, but no sore throat or chest congestion.  She is out of Ubrelvy  for her migraines and requests a refill.  She is concerned about weight gain, attributing it to menopause and poor diet. She mentions previous attempts to obtain Wegovy  for weight loss, which was denied by her insurance. She describes her diet as poor due to stress and acknowledges not exercising recently, although she has a treadmill at home.  She is currently taking Adderall but feels it is not as effective as before, possibly due to recent life changes. She is taking Wellbutrin  and Paxil  for mood and anxiety. She uses Xanax  as needed and has been using THC gummies for relaxation, especially when feeling nervous about returning to work after her husband's passing.  Her current medication regimen includes Xanax  as needed, vitamin E, a multivitamin, Claritin, B12, and magnesium. She is concerned about taking too many supplements.  She recently lost her husband to cancer and has been working from home to care for him. She is returning to work today and feels nervous about it. She has a son and is dealing with legal  matters following her husband's passing.      Social History   Tobacco Use  Smoking Status Never   Passive exposure: Never  Smokeless Tobacco Never  Tobacco Comments   I dont smoke and never have.    Current Outpatient Medications on File Prior to Visit  Medication Sig Dispense Refill   ALPRAZolam  (XANAX ) 0.5 MG tablet Take 1 tablet (0.5 mg total) by mouth 2 (two) times daily as needed for sleep or anxiety. 60 tablet 2   aspirin EC 81 MG tablet Take 81 mg by mouth daily.     azelastine  (ASTELIN ) 0.1 % nasal spray Place 1 spray into both nostrils 2 (two) times daily. Use in each nostril as directed 30 mL 3   buPROPion  (WELLBUTRIN  XL) 150 MG 24 hr tablet Take 1 tablet po QD 90 tablet 3   calcipotriene  (DOVONOX) 0.005 % cream Apply topically 3 (three) times a week. 60 g 6   cholecalciferol (VITAMIN D ) 1000 units tablet Take 2,000 Units by mouth daily.      hydrochlorothiazide  (HYDRODIURIL ) 12.5 MG tablet Take 1 tablet (12.5 mg total) by mouth daily. 90 tablet 3   ibuprofen  (ADVIL ) 600 MG tablet Take 1 tablet (600 mg total) by mouth every 8 (eight) hours as needed. 90 tablet 1   montelukast  (SINGULAIR ) 10 MG tablet Take 1 tablet (10 mg total) by mouth at bedtime. 90 tablet 3   PARoxetine  (PAXIL ) 40 MG tablet Take 1 tablet (40 mg total) by mouth every morning. 90 tablet 3   propranolol   ER (INDERAL  LA) 60 MG 24 hr capsule Take 1 capsule (60 mg total) by mouth daily. 90 capsule 3   Tapinarof  (VTAMA ) 1 % CREA Apply once daily to affected areas on face. 60 g 2   valsartan  (DIOVAN ) 80 MG tablet Take 1 tablet (80 mg total) by mouth daily. 90 tablet 3   No current facility-administered medications on file prior to visit.     ROS see history of present illness  Objective  Physical Exam Vitals:   11/12/23 0902 11/12/23 0943  BP: (!) 140/88 128/88  Pulse: 72   Temp: 98.2 F (36.8 C)   SpO2: 99%     BP Readings from Last 3 Encounters:  11/12/23 128/88  08/02/23 116/72  05/10/23  127/85   Wt Readings from Last 3 Encounters:  11/12/23 175 lb 3.2 oz (79.5 kg)  08/02/23 168 lb 12.8 oz (76.6 kg)  05/10/23 172 lb (78 kg)    Physical Exam Constitutional:      General: She is not in acute distress.    Appearance: Normal appearance.  HENT:     Head: Normocephalic.     Right Ear: Tympanic membrane normal.     Left Ear: Tympanic membrane normal.     Nose: Congestion present.     Right Sinus: Maxillary sinus tenderness present.     Left Sinus: Maxillary sinus tenderness present.     Mouth/Throat:     Mouth: Mucous membranes are moist.     Pharynx: Postnasal drip present.  Eyes:     Conjunctiva/sclera: Conjunctivae normal.     Pupils: Pupils are equal, round, and reactive to light.  Cardiovascular:     Rate and Rhythm: Normal rate and regular rhythm.     Heart sounds: Normal heart sounds.  Pulmonary:     Effort: Pulmonary effort is normal.     Breath sounds: Normal breath sounds.  Lymphadenopathy:     Cervical: No cervical adenopathy.  Skin:    General: Skin is warm and dry.  Neurological:     General: No focal deficit present.     Mental Status: She is alert.  Psychiatric:        Mood and Affect: Mood normal. Affect is tearful.        Behavior: Behavior normal.      Assessment/Plan: Please see individual problem list.  Acute non-recurrent maxillary sinusitis Assessment & Plan: She experiences persistent nasal congestion, green discharge, and epistaxis. Consistent with sinusitis. Already taking Augmentin . Continue Augmentin  875 mg twice daily for 7 days and Claritin daily. Use saline nasal spray for congestion and epistaxis. Increase fluid intake. Return precautions given to patient.    Anxiety and depression Assessment & Plan: Grief and stress after her husband's death are present. She is on Xanax , Wellbutrin , and Paxil , with no therapy but has palliative care support. Continue Xanax , Wellbutrin , and Paxil . Encourage engagement with palliative care  support. We will continue to monitor. Encouraged to contact if symptoms are worsening or changing.    Gastroesophageal reflux disease, unspecified whether esophagitis present Assessment & Plan: Recent GERD symptoms may be worsened by diet, stress, and Wegovy . Prescribe omeprazole  40 mg once daily. Counseled on common dietary triggers. We will continue to monitor.   Orders: -     Omeprazole ; Take 1 capsule (40 mg total) by mouth daily.  Dispense: 90 capsule; Refill: 3  Overweight with body mass index (BMI) of 28 to 28.9 in adult Assessment & Plan: Weight gain is linked to menopause and stress.  She is willing to improve diet and exercise with medication support. Start Wegovy  0.25 mg weekly. Counseled on the risk of pancreatitis and gallbladder disease. Discussed the risk of nausea. Advised to discontinue the Wegovy  and contact us  immediately if they develop abdominal pain. If they develop excessive nausea they will contact us  right away. I discussed that medullary thyroid  cancer has been seen in rats studies. The patient confirmed no personal or family history of thyroid  cancer, parathyroid cancer, or adrenal gland cancer. Discussed that we thus far have not seen medullary thyroid  cancer result from use of this type of medication in humans. Advised to monitor the thyroid  area and contact us  for any lumps, swelling, trouble swallowing, or any other changes in this area.  Discussed goal weight loss of 1 to 2 pounds a week while on this medication. Discussed the importance of healthy diet, exercise and lifestyle modifications even with this medication. Schedule a six-week follow-up to assess progress.  Orders: -     Wegovy ; Inject 0.25 mg into the skin once a week. X 4 weeks then increase to 0.5 mg.  Dispense: 2 mL; Refill: 0 -     Wegovy ; Inject 0.5 mg into the skin once a week.  Dispense: 2 mL; Refill: 0  Attention deficit hyperactivity disorder (ADHD), unspecified ADHD type Assessment & Plan: ADHD  is adequately controlled with Adderall 30 mg twice daily. She feels scattered and unfocused, possibly due to increased stress. No adverse side effects reports. Continue Adderall 30 twice daily. Refills provided today. PDMP reviewed.   Orders: -     Amphetamine -Dextroamphetamine ; Take 1.5 tablets (30 mg total) by mouth 2 (two) times daily.  Dispense: 90 tablet; Refill: 0 -     Amphetamine -Dextroamphetamine ; Take 1.5 tablets (30 mg total) by mouth 2 (two) times daily.  Dispense: 90 tablet; Refill: 0 -     Amphetamine -Dextroamphetamine ; Take 1.5 tablets (30 mg total) by mouth 2 (two) times daily.  Dispense: 90 tablet; Refill: 0  Other migraine without status migrainosus, not intractable Assessment & Plan: Migraines are managed with propranolol  for prophylaxis and Ubrelvy  as needed. Stress and anxiety contribute to migraines. Continue propranolol  for migraine prophylaxis and use Ubrelvy  as needed for acute attacks. Refill sent.   Orders: -     Ubrelvy ; Take 1 tablet (100 mg total) by mouth 2 (two) times daily as needed. Dx: G43.909, G44.52  Dispense: 10 tablet; Refill: 3     Return in about 6 weeks (around 12/24/2023) for Follow up.   Leron Glance, NP-C  Primary Care - [facility]

## 2023-11-13 ENCOUNTER — Other Ambulatory Visit (HOSPITAL_COMMUNITY): Payer: Self-pay

## 2023-11-13 ENCOUNTER — Telehealth: Payer: Self-pay

## 2023-11-13 NOTE — Telephone Encounter (Signed)
 Pharmacy Patient Advocate Encounter   Received notification from Onbase that prior authorization for Omeprazole  40 DR caps is required/requested.   Insurance verification completed.   The patient is insured through [facility] .   Per test claim: product service not covered.

## 2023-11-13 NOTE — Telephone Encounter (Signed)
 PA needed for [facility].

## 2023-11-14 ENCOUNTER — Telehealth: Payer: Self-pay

## 2023-11-14 ENCOUNTER — Other Ambulatory Visit (HOSPITAL_COMMUNITY): Payer: Self-pay

## 2023-11-14 NOTE — Telephone Encounter (Signed)
 PA request has been Submitted. New Encounter has been or will be created for follow up. For additional info see Pharmacy Prior Auth telephone encounter from 11/14/23.

## 2023-11-14 NOTE — Telephone Encounter (Signed)
 Pharmacy Patient Advocate Encounter   Received notification from Pt Calls Messages that prior authorization for Ubrelvy  100MG  tablets is required/requested.   Insurance verification completed.   The patient is insured through [facility] .   Per test claim: PA required; PA submitted to above mentioned insurance via Latent Key/confirmation #/[id number] Status is pending

## 2023-11-14 NOTE — Telephone Encounter (Signed)
 Pharmacy Patient Advocate Encounter   Received notification from Pt Calls Messages that prior authorization for Wegovy  0.25MG /0.5ML auto-injectors is required/requested.   Insurance verification completed.   The patient is insured through [facility] .   Per test claim: PA required; PA submitted to above mentioned insurance via Latent Key/confirmation #/[id number] Status is pending

## 2023-11-14 NOTE — Telephone Encounter (Signed)
 PA needed for Ubrelvy.

## 2023-11-15 ENCOUNTER — Other Ambulatory Visit (HOSPITAL_COMMUNITY): Payer: Self-pay

## 2023-11-15 NOTE — Telephone Encounter (Signed)
 Pharmacy Patient Advocate Encounter  Received notification from [facility] that Prior Authorization for Ubrelvy  100MG  tablets  has been APPROVED from 11/15/23 to 11/13/24. Ran test claim, Copay is $0. This test claim was processed through [facility]- copay amounts may vary at other pharmacies due to pharmacy/plan contracts, or as the patient moves through the different stages of their insurance plan.   PA #/Case ID/Reference #: [id number]

## 2023-11-15 NOTE — Telephone Encounter (Signed)
   Resubmitted PA via Latent. New Key: [id number]

## 2023-11-16 ENCOUNTER — Other Ambulatory Visit (HOSPITAL_COMMUNITY): Payer: Self-pay

## 2023-11-16 NOTE — Telephone Encounter (Signed)
 PA was also cancelled in [employer]. Called insurance at [phone]. Representative faxing over PA form. Will complete and return once received.

## 2023-11-16 NOTE — Telephone Encounter (Signed)
 PA cancelled again in Latent. Per recorded message on insurance pharmacy line, PA should be submitted via [employer].    PA submitted.

## 2023-11-16 NOTE — Telephone Encounter (Signed)
 PA was cancelled in latent. PA has been resubmitted. Waiting on determination.

## 2023-11-19 ENCOUNTER — Other Ambulatory Visit (HOSPITAL_COMMUNITY): Payer: Self-pay

## 2023-11-19 NOTE — Telephone Encounter (Signed)
 Never received PA form. Called insurance back at [phone], per recording, [employer] is closed due to a department event. Will call back later.

## 2023-11-21 ENCOUNTER — Other Ambulatory Visit (HOSPITAL_COMMUNITY): Payer: Self-pay

## 2023-11-21 NOTE — Telephone Encounter (Signed)
 Pt informed of this information.

## 2023-11-21 NOTE — Telephone Encounter (Signed)
 Called insurance again. Per representative, Wegovy  has been approved after provider courtesy review by another prescriber (Primary care at [facility]). That's why my attempts kept cancelling out. PA approved from 11/15/23-05/13/24. Ran test claim, received a paid claim of $20.   Ref #: [id number]

## 2023-11-22 ENCOUNTER — Ambulatory Visit: Admitting: Nurse Practitioner

## 2023-11-23 NOTE — Telephone Encounter (Signed)
 Pt was called and  gave this information.

## 2023-11-28 ENCOUNTER — Encounter: Payer: Self-pay | Admitting: Nurse Practitioner

## 2023-11-28 NOTE — Assessment & Plan Note (Signed)
 Recent GERD symptoms may be worsened by diet, stress, and Wegovy . Prescribe omeprazole  40 mg once daily. Counseled on common dietary triggers. We will continue to monitor.

## 2023-11-28 NOTE — Assessment & Plan Note (Signed)
 Grief and stress after her husband's death are present. She is on Xanax , Wellbutrin , and Paxil , with no therapy but has palliative care support. Continue Xanax , Wellbutrin , and Paxil . Encourage engagement with palliative care support. We will continue to monitor. Encouraged to contact if symptoms are worsening or changing.

## 2023-11-28 NOTE — Assessment & Plan Note (Addendum)
 ADHD is adequately controlled with Adderall 30 mg twice daily. She feels scattered and unfocused, possibly due to increased stress. No adverse side effects reports. Continue Adderall 30 twice daily. Refills provided today. PDMP reviewed.

## 2023-11-28 NOTE — Assessment & Plan Note (Signed)
 She experiences persistent nasal congestion, green discharge, and epistaxis. Consistent with sinusitis. Already taking Augmentin . Continue Augmentin  875 mg twice daily for 7 days and Claritin daily. Use saline nasal spray for congestion and epistaxis. Increase fluid intake. Return precautions given to patient.

## 2023-11-28 NOTE — Assessment & Plan Note (Signed)
 Migraines are managed with propranolol  for prophylaxis and Ubrelvy  as needed. Stress and anxiety contribute to migraines. Continue propranolol  for migraine prophylaxis and use Ubrelvy  as needed for acute attacks. Refill sent.

## 2023-11-28 NOTE — Assessment & Plan Note (Signed)
 Weight gain is linked to menopause and stress. She is willing to improve diet and exercise with medication support. Start Wegovy  0.25 mg weekly. Counseled on the risk of pancreatitis and gallbladder disease. Discussed the risk of nausea. Advised to discontinue the Wegovy  and contact us  immediately if they develop abdominal pain. If they develop excessive nausea they will contact us  right away. I discussed that medullary thyroid  cancer has been seen in rats studies. The patient confirmed no personal or family history of thyroid  cancer, parathyroid cancer, or adrenal gland cancer. Discussed that we thus far have not seen medullary thyroid  cancer result from use of this type of medication in humans. Advised to monitor the thyroid  area and contact us  for any lumps, swelling, trouble swallowing, or any other changes in this area.  Discussed goal weight loss of 1 to 2 pounds a week while on this medication. Discussed the importance of healthy diet, exercise and lifestyle modifications even with this medication. Schedule a six-week follow-up to assess progress.

## 2023-12-26 ENCOUNTER — Encounter: Payer: Self-pay | Admitting: Nurse Practitioner

## 2023-12-26 ENCOUNTER — Ambulatory Visit: Admitting: Nurse Practitioner

## 2023-12-26 ENCOUNTER — Other Ambulatory Visit: Payer: Self-pay | Admitting: Nurse Practitioner

## 2023-12-26 VITALS — BP 130/90 | HR 68 | Temp 97.8°F | Ht 66.0 in | Wt 172.2 lb

## 2023-12-26 DIAGNOSIS — I10 Essential (primary) hypertension: Secondary | ICD-10-CM

## 2023-12-26 DIAGNOSIS — F32A Depression, unspecified: Secondary | ICD-10-CM | POA: Diagnosis not present

## 2023-12-26 DIAGNOSIS — E663 Overweight: Secondary | ICD-10-CM

## 2023-12-26 DIAGNOSIS — F909 Attention-deficit hyperactivity disorder, unspecified type: Secondary | ICD-10-CM

## 2023-12-26 DIAGNOSIS — Z6828 Body mass index (BMI) 28.0-28.9, adult: Secondary | ICD-10-CM

## 2023-12-26 DIAGNOSIS — Z23 Encounter for immunization: Secondary | ICD-10-CM | POA: Diagnosis not present

## 2023-12-26 DIAGNOSIS — F419 Anxiety disorder, unspecified: Secondary | ICD-10-CM | POA: Diagnosis not present

## 2023-12-26 DIAGNOSIS — F411 Generalized anxiety disorder: Secondary | ICD-10-CM

## 2023-12-26 DIAGNOSIS — F5104 Psychophysiologic insomnia: Secondary | ICD-10-CM

## 2023-12-26 MED ORDER — WEGOVY 1 MG/0.5ML ~~LOC~~ SOAJ: 1.0000 mg | SUBCUTANEOUS | 1 refills | Status: DC

## 2023-12-26 NOTE — Progress Notes (Signed)
 Leron Glance, NP-C Phone: [phone]  Jillian Edwards is a 58 y.o. female who presents today for follow up.   Discussed the use of AI scribe software for clinical note transcription with the patient, who gave verbal consent to proceed.  History of Present Illness   Jillian Edwards is a 58 year old female who presents for a six-week follow-up after starting Wegovy  for weight management.  She has not noticed significant changes in her weight or overall well-being, although she has lost three pounds since starting Wegovy . She is currently on the initial dose of 0.25 mg and has been on it for four weeks. No nausea, vomiting, or abdominal pain.  She wants to reduce her medication load, including Paxil , Wellbutrin , Xanax , and Adderall. She uses Xanax  as needed, particularly in anxiety-provoking situations like concerts. She is on 30 mg of Adderall daily and is considering reducing it to 20 mg. She takes Wellbutrin  once daily and is considering tapering off by taking it every other day.  She is on propranolol  for headaches and omeprazole  for acid reflux, which she finds helpful. She also takes Singulair  and Claritin for sinus issues but often forgets to take Singulair  at night.  Her husband passed away, and she has been doing better emotionally, having started going back to church. She was supposed to start palliative therapy but has had difficulty scheduling due to work commitments.  She inquires about discontinuing her blood pressure medication, noting her diastolic level was ninety, but acknowledges her blood pressure remains high.      Social History   Tobacco Use  Smoking Status Never   Passive exposure: Never  Smokeless Tobacco Never  Tobacco Comments   I dont smoke and never have.    Current Outpatient Medications on File Prior to Visit  Medication Sig Dispense Refill   ALPRAZolam  (XANAX ) 0.5 MG tablet Take 1 tablet (0.5 mg total) by mouth 2 (two) times daily as needed for sleep  or anxiety. 60 tablet 2   [START ON 01/11/2024] amphetamine -dextroamphetamine  (ADDERALL) 20 MG tablet Take 1.5 tablets (30 mg total) by mouth 2 (two) times daily. 90 tablet 0   amphetamine -dextroamphetamine  (ADDERALL) 20 MG tablet Take 1.5 tablets (30 mg total) by mouth 2 (two) times daily. 90 tablet 0   amphetamine -dextroamphetamine  (ADDERALL) 20 MG tablet Take 1.5 tablets (30 mg total) by mouth 2 (two) times daily. 90 tablet 0   aspirin EC 81 MG tablet Take 81 mg by mouth daily.     buPROPion  (WELLBUTRIN  XL) 150 MG 24 hr tablet Take 1 tablet po QD 90 tablet 3   calcipotriene  (DOVONOX) 0.005 % cream Apply topically 3 (three) times a week. 60 g 6   cholecalciferol (VITAMIN D ) 1000 units tablet Take 2,000 Units by mouth daily.      hydrochlorothiazide  (HYDRODIURIL ) 12.5 MG tablet Take 1 tablet (12.5 mg total) by mouth daily. 90 tablet 3   ibuprofen  (ADVIL ) 600 MG tablet Take 1 tablet (600 mg total) by mouth every 8 (eight) hours as needed. 90 tablet 1   montelukast  (SINGULAIR ) 10 MG tablet Take 1 tablet (10 mg total) by mouth at bedtime. 90 tablet 3   omeprazole  (PRILOSEC) 40 MG capsule Take 1 capsule (40 mg total) by mouth daily. 90 capsule 3   PARoxetine  (PAXIL ) 40 MG tablet Take 1 tablet (40 mg total) by mouth every morning. 90 tablet 3   propranolol  ER (INDERAL  LA) 60 MG 24 hr capsule Take 1 capsule (60 mg total) by mouth daily.  90 capsule 3   semaglutide -weight management (WEGOVY ) 0.5 MG/0.5ML SOAJ SQ injection Inject 0.5 mg into the skin once a week. 2 mL 0   Tapinarof  (VTAMA ) 1 % CREA Apply once daily to affected areas on face. 60 g 2   Ubrogepant  (UBRELVY ) 100 MG TABS Take 1 tablet (100 mg total) by mouth 2 (two) times daily as needed. Dx: G43.909, G44.52 10 tablet 3   valsartan  (DIOVAN ) 80 MG tablet Take 1 tablet (80 mg total) by mouth daily. 90 tablet 3   No current facility-administered medications on file prior to visit.     ROS see history of present  illness  Objective  Physical Exam Vitals:   12/26/23 0812 12/26/23 0838  BP: (!) 132/90 (!) 130/90  Pulse: 68   Temp: 97.8 F (36.6 C)   SpO2: 98%     BP Readings from Last 3 Encounters:  12/26/23 (!) 130/90  11/12/23 128/88  08/02/23 116/72   Wt Readings from Last 3 Encounters:  12/26/23 172 lb 3.2 oz (78.1 kg)  11/12/23 175 lb 3.2 oz (79.5 kg)  08/02/23 168 lb 12.8 oz (76.6 kg)    Physical Exam Constitutional:      General: She is not in acute distress.    Appearance: Normal appearance.  HENT:     Head: Normocephalic.  Cardiovascular:     Rate and Rhythm: Normal rate and regular rhythm.     Heart sounds: Normal heart sounds.  Pulmonary:     Effort: Pulmonary effort is normal.     Breath sounds: Normal breath sounds.  Skin:    General: Skin is warm and dry.  Neurological:     General: No focal deficit present.     Mental Status: She is alert.  Psychiatric:        Mood and Affect: Mood normal.        Behavior: Behavior normal.      Assessment/Plan: Please see individual problem list.  Overweight with body mass index (BMI) of 28 to 28.9 in adult Assessment & Plan: On Wegovy  for four weeks with a three-pound weight loss. Tolerated 0.25 mg dose well. Increase Wegovy  to 0.5 mg weekly to enhance weight loss. Monitor for side effects such as nausea and vomiting. Encourage healthy diet and regular exercise.   Orders: -     Wegovy ; Inject 1 mg into the skin once a week. X 4 weeks. Start after completing 4 weeks of 0.5 mg.  Dispense: 2 mL; Refill: 1  Essential hypertension Assessment & Plan: Blood pressure elevated today in office x 2 readings. She is wanting to reduce her medication load and stop some of her blood pressure medications. Discussed monitoring blood pressure as weight loss progresses. Consider reducing blood pressure medication if blood pressure decreases with weight loss. She will continue hydrochlorothiazide  12.5 mg daily, valsartan  80 mg daily, and  propranolol  as prescribed. Encourage home blood pressure monitoring.    Anxiety and depression Assessment & Plan: Managed with Paxil , Wellbutrin  and Xanax  as needed. She desires to reduce medication. Taper Wellbutrin  by taking it every other day for two weeks, then stop. Continue Paxil  as is for now. Continue Xanax  as needed for panic attacks or anxiety-inducing situations. Counseled on common side effects. Advised if having mood changes or increased depression she can resume Wellbutrin  daily. We will continue to monitor.    Attention deficit hyperactivity disorder (ADHD), unspecified ADHD type Assessment & Plan: Managed with Adderall 30 mg daily. She wishes to reduce medication. Reduce Adderall  to 20 mg daily by taking one tablet instead of one and a half. We will continue to monitor.    Need for influenza vaccination -     Flu vaccine trivalent PF, 6mos and older(Flulaval,Afluria,Fluarix,Fluzone)      Return in about 6 weeks (around 02/06/2024) for Follow up.   Leron Glance, NP-C [facility]

## 2023-12-26 NOTE — Assessment & Plan Note (Addendum)
 Blood pressure elevated today in office x 2 readings. She is wanting to reduce her medication load and stop some of her blood pressure medications. Discussed monitoring blood pressure as weight loss progresses. Consider reducing blood pressure medication if blood pressure decreases with weight loss. She will continue hydrochlorothiazide  12.5 mg daily, valsartan  80 mg daily, and propranolol  as prescribed. Encourage home blood pressure monitoring.

## 2023-12-26 NOTE — Assessment & Plan Note (Signed)
 On Wegovy  for four weeks with a three-pound weight loss. Tolerated 0.25 mg dose well. Increase Wegovy  to 0.5 mg weekly to enhance weight loss. Monitor for side effects such as nausea and vomiting. Encourage healthy diet and regular exercise.

## 2023-12-26 NOTE — Assessment & Plan Note (Signed)
 Managed with Paxil , Wellbutrin  and Xanax  as needed. She desires to reduce medication. Taper Wellbutrin  by taking it every other day for two weeks, then stop. Continue Paxil  as is for now. Continue Xanax  as needed for panic attacks or anxiety-inducing situations. Counseled on common side effects. Advised if having mood changes or increased depression she can resume Wellbutrin  daily. We will continue to monitor.

## 2023-12-26 NOTE — Assessment & Plan Note (Signed)
 Managed with Adderall 30 mg daily. She wishes to reduce medication. Reduce Adderall to 20 mg daily by taking one tablet instead of one and a half. We will continue to monitor.

## 2024-01-02 ENCOUNTER — Encounter: Payer: Self-pay | Admitting: Nurse Practitioner

## 2024-01-11 DIAGNOSIS — Z1231 Encounter for screening mammogram for malignant neoplasm of breast: Secondary | ICD-10-CM | POA: Diagnosis not present

## 2024-01-11 LAB — HM MAMMOGRAPHY

## 2024-01-16 ENCOUNTER — Other Ambulatory Visit: Payer: Self-pay | Admitting: Family

## 2024-01-16 MED ORDER — WEGOVY 1.7 MG/0.75ML ~~LOC~~ SOAJ: 1.7000 mg | SUBCUTANEOUS | 1 refills | Status: DC

## 2024-01-17 NOTE — Telephone Encounter (Signed)
 Noted

## 2024-02-13 ENCOUNTER — Ambulatory Visit: Admitting: Nurse Practitioner

## 2024-02-13 VITALS — BP 126/84 | HR 71 | Temp 98.2°F | Ht 66.0 in | Wt 167.2 lb

## 2024-02-13 DIAGNOSIS — Z8673 Personal history of transient ischemic attack (TIA), and cerebral infarction without residual deficits: Secondary | ICD-10-CM | POA: Diagnosis not present

## 2024-02-13 DIAGNOSIS — I10 Essential (primary) hypertension: Secondary | ICD-10-CM

## 2024-02-13 DIAGNOSIS — F419 Anxiety disorder, unspecified: Secondary | ICD-10-CM | POA: Diagnosis not present

## 2024-02-13 DIAGNOSIS — E559 Vitamin D deficiency, unspecified: Secondary | ICD-10-CM

## 2024-02-13 DIAGNOSIS — F909 Attention-deficit hyperactivity disorder, unspecified type: Secondary | ICD-10-CM

## 2024-02-13 DIAGNOSIS — F32A Depression, unspecified: Secondary | ICD-10-CM

## 2024-02-13 DIAGNOSIS — E663 Overweight: Secondary | ICD-10-CM

## 2024-02-13 DIAGNOSIS — Z6828 Body mass index (BMI) 28.0-28.9, adult: Secondary | ICD-10-CM

## 2024-02-13 MED ORDER — WEGOVY 1.7 MG/0.75ML ~~LOC~~ SOAJ: 1.7000 mg | SUBCUTANEOUS | 2 refills | Status: DC

## 2024-02-13 NOTE — Progress Notes (Signed)
 Jillian Glance, NP-C Phone: [phone]  Jillian Edwards is a 58 y.o. female who presents today for follow up on weight management.   Discussed the use of AI scribe software for clinical note transcription with the patient, who gave verbal consent to proceed.  History of Present Illness   Jillian Edwards is a 58 year old female who presents for a six-week follow-up on Wegovy  for weight management.  She has been on a weight management program with Wegovy , resulting in an eight-pound weight loss over the past three months, from 175 pounds to 167 pounds. Her target weight is 135 pounds. She is currently on a 1.7 mg dose of Wegovy , taken for four weeks. Despite maintaining good dietary habits, she faces challenges with exercise due to moving and personal stressors.  Her blood pressure has shown improvement, and she is managing without bupropion . She is currently taking Adderall. She experiences anxiety and stress related to personal circumstances, including moving and the recent loss of her husband. She describes herself as a 'worrier' and acknowledges stress from her current life situation.  No nausea, constipation, diarrhea, or acid reflux. Her acid reflux has improved with medication. She has abstained from soft drinks for a month and uses Mio and electrolyte supplements to increase water intake. She notes dry mouth as a side effect of Wegovy .  She inquired about her mammogram results, which indicated dense breasts, and discussed her inability to undergo a coronary artery calcium scan due to breast implants. 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 She is not on cholesterol medication and is concerned about cardiovascular health due to a family history of stroke.      Social History   Tobacco Use  Smoking Status Never   Passive exposure: Never  Smokeless Tobacco Never  Tobacco Comments   I dont smoke and never have.    Current Outpatient  Medications on File Prior to Visit  Medication Sig Dispense Refill   ALPRAZolam  (XANAX ) 0.5 MG tablet TAKE 1 TABLET BY MOUTH TWICE DAILY AS NEEDED FOR SLEEP OR ANXIETY 60 tablet 2   aspirin EC 81 MG tablet Take 81 mg by mouth daily.     calcipotriene  (DOVONOX) 0.005 % cream Apply topically 3 (three) times a week. 60 g 6   cholecalciferol (VITAMIN D ) 1000 units tablet Take 2,000 Units by mouth daily.      hydrochlorothiazide  (HYDRODIURIL ) 12.5 MG tablet Take 1 tablet (12.5 mg total) by mouth daily. 90 tablet 3   ibuprofen  (ADVIL ) 600 MG tablet Take 1 tablet (600 mg total) by mouth every 8 (eight) hours as needed. 90 tablet 1   montelukast  (SINGULAIR ) 10 MG tablet Take 1 tablet (10 mg total) by mouth at bedtime. 90 tablet 3   omeprazole  (PRILOSEC) 40 MG capsule Take 1 capsule (40 mg total) by mouth daily. 90 capsule 3   PARoxetine  (PAXIL ) 40 MG tablet Take 1 tablet (40 mg total) by mouth every morning. 90 tablet 3   propranolol  ER (INDERAL  LA) 60 MG 24 hr capsule Take 1 capsule (60 mg total) by mouth daily. 90 capsule 3   Tapinarof  (VTAMA ) 1 % CREA Apply once daily to affected areas on face. 60 g 2   Ubrogepant  (UBRELVY ) 100 MG TABS Take 1 tablet (100 mg total) by mouth 2 (two) times daily as needed. Dx: G43.909, G44.52 10 tablet 3   valsartan  (DIOVAN ) 80 MG tablet Take 1 tablet (80 mg total) by mouth  daily. 90 tablet 3   No current facility-administered medications on file prior to visit.     ROS see history of present illness  Objective  Physical Exam Vitals:   02/13/24 1551  BP: 126/84  Pulse: 71  Temp: 98.2 F (36.8 C)  SpO2: 97%    BP Readings from Last 3 Encounters:  02/13/24 126/84  12/26/23 (!) 130/90  11/12/23 128/88   Wt Readings from Last 3 Encounters:  02/13/24 167 lb 3.2 oz (75.8 kg)  12/26/23 172 lb 3.2 oz (78.1 kg)  11/12/23 175 lb 3.2 oz (79.5 kg)    Physical Exam Constitutional:      General: She is not in acute distress.    Appearance: Normal  appearance.  HENT:     Head: Normocephalic.  Cardiovascular:     Rate and Rhythm: Normal rate and regular rhythm.     Heart sounds: Normal heart sounds.  Pulmonary:     Effort: Pulmonary effort is normal.     Breath sounds: Normal breath sounds.  Skin:    General: Skin is warm and dry.  Neurological:     General: No focal deficit present.     Mental Status: She is alert.  Psychiatric:        Mood and Affect: Mood normal.        Behavior: Behavior normal.      Assessment/Plan: Please see individual problem list.  Overweight with body mass index (BMI) of 28 to 28.9 in adult Assessment & Plan: Managed with Wegovy , resulting in an 8-pound weight loss over three months. Current weight is 167 pounds with a goal of 135 pounds. She faces challenges with exercise due to personal stressors. Increased Wegovy  to 2.4 mg weekly. Encouraged increased exercise and dietary modifications.  Orders: -     Wegovy ; Inject 1.7 mg into the skin once a week.  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 Discussed statin therapy benefits, but she prefers lifestyle modifications. Continue aspirin 81 mg daily for stroke prevention.  Orders: -     Lipid panel; Future  Anxiety and depression Assessment & Plan: High screening scores for anxiety and depression- PHQ- 20 and GAD- 16. Increase in life stressors, husband recently passed. She stopped Wellbutrin  as she is trying to reduce her medication load. Currently managed with Paxil  daily and Xanax  twice daily as needed. She does not feel that she needs additional medication at this time. Continue current medication regimen. We will continue to monitor.   Orders: -     TSH; Future  Essential hypertension Assessment & Plan: Blood pressure is controlled on current medication regimen. She will continue  hydrochlorothiazide  12.5 mg daily, valsartan  80 mg daily, and propranolol  as prescribed. Encourage home blood pressure monitoring.   Orders: -     CBC with Differential/Platelet; Future -     Comprehensive metabolic panel with GFR; Future  Attention deficit hyperactivity disorder (ADHD), unspecified ADHD type Assessment & Plan: Managed with Adderall 30 mg daily. She wishes to reduce medication to assess tolerance and effectiveness. Reduce Adderall to 20 mg daily by taking one tablet daily instead of one and a half. We will continue to monitor. PDMP reviewed.    Vitamin D  deficiency -     VITAMIN D  25 Hydroxy (Vit-D Deficiency, Fractures); Future     Return for  fasting lab work then in 3 months for follow up.   Jillian Glance, NP-C [facility]

## 2024-02-14 ENCOUNTER — Other Ambulatory Visit

## 2024-02-14 DIAGNOSIS — F32A Depression, unspecified: Secondary | ICD-10-CM | POA: Diagnosis not present

## 2024-02-14 DIAGNOSIS — I10 Essential (primary) hypertension: Secondary | ICD-10-CM | POA: Diagnosis not present

## 2024-02-14 DIAGNOSIS — F419 Anxiety disorder, unspecified: Secondary | ICD-10-CM

## 2024-02-14 DIAGNOSIS — Z6828 Body mass index (BMI) 28.0-28.9, adult: Secondary | ICD-10-CM

## 2024-02-14 DIAGNOSIS — E663 Overweight: Secondary | ICD-10-CM

## 2024-02-14 DIAGNOSIS — Z8673 Personal history of transient ischemic attack (TIA), and cerebral infarction without residual deficits: Secondary | ICD-10-CM | POA: Diagnosis not present

## 2024-02-14 DIAGNOSIS — E559 Vitamin D deficiency, unspecified: Secondary | ICD-10-CM | POA: Diagnosis not present

## 2024-02-14 LAB — CBC WITH DIFFERENTIAL/PLATELET
Basophils Absolute: 0 K/uL (ref 0.0–0.1)
Basophils Relative: 1.1 % (ref 0.0–3.0)
Eosinophils Absolute: 0.1 K/uL (ref 0.0–0.7)
Eosinophils Relative: 3.1 % (ref 0.0–5.0)
HCT: 41.8 % (ref 36.0–46.0)
Hemoglobin: 14.1 g/dL (ref 12.0–15.0)
Lymphocytes Relative: 41.1 % (ref 12.0–46.0)
Lymphs Abs: 1.7 K/uL (ref 0.7–4.0)
MCHC: 33.7 g/dL (ref 30.0–36.0)
MCV: 93.4 fl (ref 78.0–100.0)
Monocytes Absolute: 0.4 K/uL (ref 0.1–1.0)
Monocytes Relative: 9.2 % (ref 3.0–12.0)
Neutro Abs: 1.9 K/uL (ref 1.4–7.7)
Neutrophils Relative %: 45.5 % (ref 43.0–77.0)
Platelets: 332 K/uL (ref 150.0–400.0)
RBC: 4.48 Mil/uL (ref 3.87–5.11)
RDW: 12.9 % (ref 11.5–15.5)
WBC: 4.2 K/uL (ref 4.0–10.5)

## 2024-02-14 LAB — COMPREHENSIVE METABOLIC PANEL WITH GFR
ALT: 12 U/L (ref 0–35)
AST: 14 U/L (ref 0–37)
Albumin: 4.4 g/dL (ref 3.5–5.2)
Alkaline Phosphatase: 111 U/L (ref 39–117)
BUN: 15 mg/dL (ref 6–23)
CO2: 31 meq/L (ref 19–32)
Calcium: 11 mg/dL — ABNORMAL HIGH (ref 8.4–10.5)
Chloride: 102 meq/L (ref 96–112)
Creatinine, Ser: 0.87 mg/dL (ref 0.40–1.20)
GFR: 73.63 mL/min (ref >60.00)
Glucose, Bld: 91 mg/dL (ref 70–99)
Potassium: 4.5 meq/L (ref 3.5–5.1)
Sodium: 139 meq/L (ref 135–145)
Total Bilirubin: 0.6 mg/dL (ref 0.2–1.2)
Total Protein: 6.7 g/dL (ref 6.0–8.3)

## 2024-02-14 LAB — HEMOGLOBIN A1C: Hgb A1c MFr Bld: 4.7 % (ref 4.6–6.5)

## 2024-02-14 LAB — LIPID PANEL
Cholesterol: 176 mg/dL (ref 0–200)
HDL: 62.8 mg/dL (ref >39.00)
LDL Cholesterol: 99 mg/dL (ref 0–99)
NonHDL: 112.77
Total CHOL/HDL Ratio: 3
Triglycerides: 71 mg/dL (ref 0.0–149.0)
VLDL: 14.2 mg/dL (ref 0.0–40.0)

## 2024-02-14 LAB — TSH: TSH: 1.17 u[IU]/mL (ref 0.35–5.50)

## 2024-02-14 LAB — VITAMIN D 25 HYDROXY (VIT D DEFICIENCY, FRACTURES): VITD: 27.81 ng/mL — ABNORMAL LOW (ref 30.00–100.00)

## 2024-02-22 ENCOUNTER — Other Ambulatory Visit: Payer: Self-pay | Admitting: Nurse Practitioner

## 2024-02-22 DIAGNOSIS — F909 Attention-deficit hyperactivity disorder, unspecified type: Secondary | ICD-10-CM

## 2024-02-28 ENCOUNTER — Ambulatory Visit: Payer: Self-pay | Admitting: Nurse Practitioner

## 2024-02-29 ENCOUNTER — Encounter: Payer: Self-pay | Admitting: Nurse Practitioner

## 2024-02-29 NOTE — Assessment & Plan Note (Signed)
 Managed with Wegovy , resulting in an 8-pound weight loss over three months. Current weight is 167 pounds with a goal of 135 pounds. She faces challenges with exercise due to personal stressors. Increased Wegovy  to 2.4 mg weekly. Encouraged increased exercise and dietary modifications.

## 2024-02-29 NOTE — Assessment & Plan Note (Signed)
 Managed with Adderall 30 mg daily. She wishes to reduce medication to assess tolerance and effectiveness. Reduce Adderall to 20 mg daily by taking one tablet daily instead of one and a half. We will continue to monitor. PDMP reviewed.

## 2024-02-29 NOTE — Assessment & Plan Note (Signed)
 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 Discussed statin therapy benefits, but she prefers lifestyle modifications. Continue aspirin 81 mg daily for stroke prevention.

## 2024-02-29 NOTE — Assessment & Plan Note (Signed)
 Blood pressure is controlled on current medication regimen. She will continue hydrochlorothiazide  12.5 mg daily, valsartan  80 mg daily, and propranolol  as prescribed. Encourage home blood pressure monitoring.

## 2024-02-29 NOTE — Assessment & Plan Note (Addendum)
 High screening scores for anxiety and depression- PHQ- 20 and GAD- 16. Increase in life stressors, husband recently passed. She stopped Wellbutrin  as she is trying to reduce her medication load. Currently managed with Paxil  daily and Xanax  twice daily as needed. She does not feel that she needs additional medication at this time. Continue current medication regimen. We will continue to monitor.

## 2024-03-11 ENCOUNTER — Other Ambulatory Visit: Payer: Self-pay | Admitting: Nurse Practitioner

## 2024-03-13 ENCOUNTER — Other Ambulatory Visit (INDEPENDENT_AMBULATORY_CARE_PROVIDER_SITE_OTHER)

## 2024-03-15 LAB — PTH, INTACT AND CALCIUM
Calcium: 11.2 mg/dL — ABNORMAL HIGH (ref 8.6–10.4)
PTH: 76 pg/mL (ref 16–77)

## 2024-03-18 ENCOUNTER — Other Ambulatory Visit: Payer: Self-pay

## 2024-03-18 ENCOUNTER — Other Ambulatory Visit: Payer: Self-pay | Admitting: Nurse Practitioner

## 2024-03-18 ENCOUNTER — Ambulatory Visit: Payer: Self-pay | Admitting: Nurse Practitioner

## 2024-03-18 DIAGNOSIS — E663 Overweight: Secondary | ICD-10-CM

## 2024-03-18 MED ORDER — WEGOVY 2.4 MG/0.75ML ~~LOC~~ SOAJ: 2.4000 mg | SUBCUTANEOUS | 3 refills | Status: DC

## 2024-04-02 ENCOUNTER — Encounter: Payer: Self-pay | Admitting: Nurse Practitioner

## 2024-04-02 DIAGNOSIS — E663 Overweight: Secondary | ICD-10-CM

## 2024-04-08 ENCOUNTER — Other Ambulatory Visit: Payer: Self-pay | Admitting: Medical Genetics

## 2024-04-16 MED ORDER — WEGOVY 2.4 MG/0.75ML ~~LOC~~ SOAJ: 2.4000 mg | SUBCUTANEOUS | 3 refills | Status: AC

## 2024-04-25 ENCOUNTER — Other Ambulatory Visit

## 2024-05-14 ENCOUNTER — Ambulatory Visit: Admitting: Nurse Practitioner
